# Patient Record
Sex: Male | Born: 1935 | Race: Asian | Hispanic: No | Marital: Married | Smoking: Former smoker
Health system: Southern US, Community
[De-identification: ages and names within clinical notes are randomized; demographics above are authoritative.]

## PROBLEM LIST (undated history)

## (undated) DIAGNOSIS — I639 Cerebral infarction, unspecified: Secondary | ICD-10-CM

## (undated) DIAGNOSIS — E7800 Pure hypercholesterolemia, unspecified: Secondary | ICD-10-CM

## (undated) DIAGNOSIS — I4891 Unspecified atrial fibrillation: Secondary | ICD-10-CM

## (undated) DIAGNOSIS — K219 Gastro-esophageal reflux disease without esophagitis: Secondary | ICD-10-CM

## (undated) DIAGNOSIS — I499 Cardiac arrhythmia, unspecified: Secondary | ICD-10-CM

## (undated) DIAGNOSIS — R42 Dizziness and giddiness: Secondary | ICD-10-CM

## (undated) DIAGNOSIS — M545 Low back pain, unspecified: Secondary | ICD-10-CM

## (undated) DIAGNOSIS — M1990 Unspecified osteoarthritis, unspecified site: Secondary | ICD-10-CM

## (undated) DIAGNOSIS — R0602 Shortness of breath: Secondary | ICD-10-CM

## (undated) DIAGNOSIS — I10 Essential (primary) hypertension: Secondary | ICD-10-CM

## (undated) DIAGNOSIS — R0600 Dyspnea, unspecified: Secondary | ICD-10-CM

## (undated) DIAGNOSIS — T8859XA Other complications of anesthesia, initial encounter: Secondary | ICD-10-CM

## (undated) DIAGNOSIS — G8929 Other chronic pain: Secondary | ICD-10-CM

## (undated) DIAGNOSIS — B1910 Unspecified viral hepatitis B without hepatic coma: Secondary | ICD-10-CM

## (undated) DIAGNOSIS — R0609 Other forms of dyspnea: Secondary | ICD-10-CM

## (undated) DIAGNOSIS — G459 Transient cerebral ischemic attack, unspecified: Secondary | ICD-10-CM

## (undated) DIAGNOSIS — T4145XA Adverse effect of unspecified anesthetic, initial encounter: Secondary | ICD-10-CM

## (undated) HISTORY — PX: EYE SURGERY: SHX253

## (undated) HISTORY — DX: Dyspnea, unspecified: R06.00

## (undated) HISTORY — DX: Other forms of dyspnea: R06.09

## (undated) SURGERY — MINOR CAPSULOTOMY
Anesthesia: Choice | Laterality: Right

---

## 1998-10-21 ENCOUNTER — Inpatient Hospital Stay (HOSPITAL_COMMUNITY): Admission: EM | Admit: 1998-10-21 | Discharge: 1998-10-22 | Payer: Self-pay | Admitting: Emergency Medicine

## 1998-10-21 ENCOUNTER — Encounter: Payer: Self-pay | Admitting: Emergency Medicine

## 1998-10-22 ENCOUNTER — Encounter: Payer: Self-pay | Admitting: Internal Medicine

## 1998-11-11 ENCOUNTER — Encounter: Admission: RE | Admit: 1998-11-11 | Discharge: 1998-11-11 | Payer: Self-pay | Admitting: Internal Medicine

## 1999-04-14 ENCOUNTER — Encounter: Admission: RE | Admit: 1999-04-14 | Discharge: 1999-04-14 | Payer: Self-pay | Admitting: Internal Medicine

## 1999-05-20 ENCOUNTER — Encounter: Admission: RE | Admit: 1999-05-20 | Discharge: 1999-05-20 | Payer: Self-pay | Admitting: Internal Medicine

## 1999-06-16 ENCOUNTER — Encounter: Admission: RE | Admit: 1999-06-16 | Discharge: 1999-06-16 | Payer: Self-pay | Admitting: Internal Medicine

## 1999-08-09 ENCOUNTER — Encounter: Admission: RE | Admit: 1999-08-09 | Discharge: 1999-08-09 | Payer: Self-pay | Admitting: Internal Medicine

## 1999-08-16 ENCOUNTER — Encounter: Admission: RE | Admit: 1999-08-16 | Discharge: 1999-08-16 | Payer: Self-pay | Admitting: Internal Medicine

## 1999-11-26 ENCOUNTER — Encounter: Admission: RE | Admit: 1999-11-26 | Discharge: 1999-11-26 | Payer: Self-pay | Admitting: Internal Medicine

## 1999-12-22 ENCOUNTER — Encounter: Admission: RE | Admit: 1999-12-22 | Discharge: 1999-12-22 | Payer: Self-pay | Admitting: Hematology and Oncology

## 2000-03-31 ENCOUNTER — Encounter: Admission: RE | Admit: 2000-03-31 | Discharge: 2000-03-31 | Payer: Self-pay | Admitting: Internal Medicine

## 2003-06-16 ENCOUNTER — Encounter: Admission: RE | Admit: 2003-06-16 | Discharge: 2003-06-16 | Payer: Self-pay | Admitting: Internal Medicine

## 2003-12-17 ENCOUNTER — Encounter: Admission: RE | Admit: 2003-12-17 | Discharge: 2003-12-17 | Payer: Self-pay | Admitting: Internal Medicine

## 2007-02-12 ENCOUNTER — Encounter: Admission: RE | Admit: 2007-02-12 | Discharge: 2007-02-12 | Payer: Self-pay | Admitting: Internal Medicine

## 2010-03-07 DIAGNOSIS — G459 Transient cerebral ischemic attack, unspecified: Secondary | ICD-10-CM

## 2010-03-07 HISTORY — DX: Transient cerebral ischemic attack, unspecified: G45.9

## 2010-08-03 ENCOUNTER — Emergency Department (HOSPITAL_COMMUNITY): Payer: Medicare Other

## 2010-08-03 ENCOUNTER — Emergency Department (HOSPITAL_COMMUNITY)
Admission: EM | Admit: 2010-08-03 | Discharge: 2010-08-04 | Disposition: A | Discharge status: Other Acute Inpatient Hospital | Payer: Medicare Other | Source: Home / Self Care | Attending: Emergency Medicine | Admitting: Emergency Medicine

## 2010-08-03 LAB — CBC
HCT: 41.7 % (ref 39.0–52.0)
Hemoglobin: 13.6 g/dL (ref 13.0–17.0)
MCHC: 32.6 g/dL (ref 30.0–36.0)
MCV: 84.8 fL (ref 78.0–100.0)
RDW: 13.2 % (ref 11.5–15.5)

## 2010-08-03 LAB — URINALYSIS, ROUTINE W REFLEX MICROSCOPIC
Bilirubin Urine: NEGATIVE
Glucose, UA: NEGATIVE mg/dL
Hgb urine dipstick: NEGATIVE
pH: 7.5 (ref 5.0–8.0)

## 2010-08-03 LAB — DIFFERENTIAL
Eosinophils Relative: 3 % (ref 0–5)
Lymphocytes Relative: 10 % — ABNORMAL LOW (ref 12–46)
Lymphs Abs: 1.3 10*3/uL (ref 0.7–4.0)
Monocytes Absolute: 0.7 10*3/uL (ref 0.1–1.0)

## 2010-08-04 ENCOUNTER — Emergency Department (HOSPITAL_COMMUNITY): Payer: Medicare Other

## 2010-08-04 ENCOUNTER — Encounter (HOSPITAL_COMMUNITY): Payer: Self-pay

## 2010-08-04 ENCOUNTER — Observation Stay (HOSPITAL_COMMUNITY)
Admission: EM | Admit: 2010-08-04 | Discharge: 2010-08-05 | Disposition: A | Discharge status: Home / Self Care | Payer: Medicare Other | Source: Other Acute Inpatient Hospital | Attending: Neurology | Admitting: Neurology

## 2010-08-04 ENCOUNTER — Inpatient Hospital Stay (HOSPITAL_COMMUNITY): Payer: Medicare Other

## 2010-08-04 DIAGNOSIS — E785 Hyperlipidemia, unspecified: Secondary | ICD-10-CM | POA: Insufficient documentation

## 2010-08-04 DIAGNOSIS — G458 Other transient cerebral ischemic attacks and related syndromes: Principal | ICD-10-CM | POA: Insufficient documentation

## 2010-08-04 DIAGNOSIS — I10 Essential (primary) hypertension: Secondary | ICD-10-CM | POA: Insufficient documentation

## 2010-08-04 DIAGNOSIS — Z8673 Personal history of transient ischemic attack (TIA), and cerebral infarction without residual deficits: Secondary | ICD-10-CM | POA: Insufficient documentation

## 2010-08-04 DIAGNOSIS — I059 Rheumatic mitral valve disease, unspecified: Secondary | ICD-10-CM

## 2010-08-04 LAB — COMPREHENSIVE METABOLIC PANEL
ALT: 21 U/L (ref 0–53)
AST: 28 U/L (ref 0–37)
Alkaline Phosphatase: 40 U/L (ref 39–117)
CO2: 26 mEq/L (ref 19–32)
GFR calc Af Amer: >60 mL/min (ref >60)
Glucose, Bld: 116 mg/dL — ABNORMAL HIGH (ref 70–99)
Potassium: 4.4 mEq/L (ref 3.5–5.1)
Sodium: 138 mEq/L (ref 135–145)
Total Protein: 7 g/dL (ref 6.0–8.3)

## 2010-08-04 LAB — CK TOTAL AND CKMB (NOT AT ARMC): Relative Index: 1.7 (ref 0.0–2.5)

## 2010-08-04 LAB — HEMOGLOBIN A1C: Mean Plasma Glucose: 114 mg/dL (ref <117)

## 2010-08-04 LAB — LIPID PANEL
Cholesterol: 153 mg/dL (ref 0–200)
HDL: 45 mg/dL (ref >39)
LDL Cholesterol: 91 mg/dL (ref 0–99)
Total CHOL/HDL Ratio: 3.4 RATIO
Triglycerides: 85 mg/dL (ref <150)

## 2010-08-04 LAB — LIPASE, BLOOD: Lipase: 57 U/L (ref 11–59)

## 2010-08-04 MED ORDER — GADOBENATE DIMEGLUMINE 529 MG/ML IV SOLN
15.0000 mL | Freq: Once | INTRAVENOUS | Status: AC
  Administered 2010-08-04: 15 mL via INTRAVENOUS

## 2010-08-09 NOTE — H&P (Signed)
Terry Gray               ACCOUNT NO.:  [id number]  MEDICAL RECORD NO.:  [id number]           PATIENT TYPE:  E  LOCATION:  WLED                         FACILITY:  [facility]  PHYSICIAN:  Marlan Palau, M.D.  DATE OF BIRTH:  [birth year]  DATE OF ADMISSION:  08/03/2010 DATE OF DISCHARGE:  08/04/2010                             HISTORY & PHYSICAL   HISTORY OF PRESENT ILLNESS:  Terry Gray is a 77 year old [place] male, born 01-20-1934, with a history of hypertension and dyslipidemia.  This patient comes to the [facility] Emergency Room for an evaluation.  Around 6:30 p.m. on the Aug 03, 2010, the patient had onset of nausea, vomiting, chest pain, diaphoresis and left-sided headache.  The patient felt as though left-sided body was somewhat heavy and had some confused speech.  Most of the deficits improved with the patient has continued to have some sensation of heaviness of the left arm and leg.  The patient has come to the emergency room for an evaluation.  The patient has undergone a CT scan of the brain that was unremarkable.  NIH stroke scale score is 2 and Neurology was asked to see the patient for further evaluation.  The patient has a history of prior stroke in the past.  PAST MEDICAL HISTORY:  Significant for: 1. New onset of left hemisensory deficit. 2. Cerebrovascular disease. 3. Hypertension. 4. Dyslipidemia. 5. Nonphysiologic examination.  MEDICATIONS: 1. Ramipril 10 mg daily. 2. Hydrochlorothiazide 25 mg daily. 3. Aspirin 81 mg daily. 4. Atorvastatin 20 mg daily.  ALLERGIES:  Has no known allergies.  SOCIAL HISTORY:  The patient does not smoke, drinks a glass of wine a day.  This patient is married, lives in the [place] area, has 8 children, few are alive and well.  The patient is retired.  FAMILY MEDICAL HISTORY:  Noted that mother died with cancer.  Father died with stroke.  The patient has 2 brothers, 2 sisters.  One sister died with  cancer.  Another sister has thyroid disease.  One brother has heart disease.  REVIEW OF SYSTEMS:  Notable for no recent fevers, chills.  The patient does note left-sided headache.  Does note some chest pain.  Denies shortness of breath.  Does note some dizziness, frequent urination today.  Denies blackouts.  PHYSICAL EXAMINATION:  VITAL SIGNS:  Blood pressure is notable for 126/77, heart rate 64, respiratory rate 20, temperature afebrile. GENERAL:  This patient is a minimally obese [place] male who is alert, cooperative at the time of examination. HEENT:  Head is atraumatic.  Eyes:  Pupils are equal, round, and reactive to light. NECK:  Supple.  No carotid bruits noted. RESPIRATORY:  Clear. CARDIOVASCULAR:  Regular rate and rhythm.  No obvious murmurs, rubs noted. EXTREMITIES:  Without significant edema. NEUROLOGIC:  Cranial nerves as above.  Facial symmetry is present.  The patient has good sensation of the face on the right, decreased on the left, splits midline with a vibratory sensation on the forehead.  Again, extraocular movements are full.  Visual fields are full.  Speech is normal.  No aphasia noted.  Motor testing  reveals good strength in all fours.  Good symmetric motor tone is noted throughout.  Sensory test reveals some decreased pinprick, soft touch, vibratory sensation on the left arm, left leg as compared to the right.  The patient has good finger-nose-finger, heel-to-shin.  Gait was not tested.  Deep tendon reflexes are symmetric.  Toes are downgoing bilaterally.  No drift is seen on the arms or legs.  The patient has no evidence of an aphasia.  LABORATORY VALUES:  Notable for a white count of 12.2, hemoglobin 13.6, hematocrit 41.7, MCV 84.8, platelets of 140.  Sodium 138, potassium 4.4, chloride of 101, CO2 26, glucose of 116, BUN of 14, creatinine 0.78, calcium 8.9, total protein 7.0, albumin 3.5, AST 28.  CK is 212, MB fraction 3.5, troponin I less than 0.3,  lipase 57.  Urinalysis reveals specific gravity of 1.015, pH 7.5, otherwise unremarkable.  CT of the head is unremarkable.  IMPRESSION: 1. Left hemisensory deficit.  NIH stroke scale score of 2. 2. Nonphysiologic examination. 3. Hypertension. 4. Dyslipidemia.  This patient apparently has a prior history of cerebrovascular disease. The patient comes in with subjective complaints of heaviness on the left side of the body, left hemisensory deficit, but splits midline with vibratory sensation on the forehead suggesting a nonphysiologic examination.  The patient will be admitted for evaluation of cerebrovascular disease at this point.  We will admit the patient to [facility] and we will obtain MRI of the brain and MRA of the head and neck.  The patient will undergo a 2-D echocardiogram and continued to be maintained on aspirin therapy.  We will follow the patient's clinical course while in-house.     Marlan Palau, M.D.     CKW/MEDQ  D:  08/04/2010  T:  08/04/2010  Job:  [id number]  cc:   Georgann Housekeeper, MD  Electronically Signed by Thana Farr M.D. on 08/09/2010 09:31:01 AM

## 2010-08-22 NOTE — Discharge Summary (Signed)
Gray, Terry               ACCOUNT NO.:  [id number]  MEDICAL RECORD NO.:  [id number]           PATIENT TYPE:  O  LOCATION:  3701                         FACILITY:  [facility]  PHYSICIAN:  Sheala Dosh P. Pearlean Brownie, MD    DATE OF BIRTH:  June 08, 1933  DATE OF ADMISSION:  08/04/2010 DATE OF DISCHARGE:  08/05/2010                              DISCHARGE SUMMARY   DIAGNOSES AT TIME OF DISCHARGE: 1. Vertebrobasilar transient ischemic attack. 2. Hypertension. 3. Hyperlipidemia. 4. History of stroke in the past, cerebrovascular disease.  MEDICINES AT TIME OF DISCHARGE: 1. Clopidogrel 75 mg a day. 2. Atorvastatin 20 mg 1/2 tablet every evening. 3. Hydrochlorothiazide 25 mg a day. 4. Ramipril 10 mg a day.  STUDIES PERFORMED: 1. CT of the brain on admission shows no acute abnormality. 2. Chest x-ray shows no acute cardiopulmonary processes seen, mild     calcification of the abdominal aorta. 3. MRI of the brain shows no acute stroke.  Old lacunar infarct, left     thalamus.  Chronic small vessel disease affecting the hemispheric     white matter. 4. MRA of the head normal and medium and large-sized vessels. 5. MRA of the neck shows some image degradation because of motion and     bolus timing.  No posterior circulation pathology suspected.     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 6. Carotid Doppler not performed. 7. Two-D echocardiogram shows EF of 55-60% with no obvious source of     embolus. 8. EKG, formal reading pending, unconfirmed normal sinus rhythm.  LABORATORY STUDIES:  Hemoglobin A1c 5.6.  Cholesterol 153, triglycerides 85, HDL 45, and LDL 91.  Coagulation studies normal.  Lipase 57. Cardiac enzymes negative.  Chemistry with glucose 116, otherwise normal. Liver function tests normal.  CBC with white blood cells 12.2, platelets 114, neutrophils 81, lymphocytes 10, otherwise normal.  Urinalysis normal.  HISTORY OF PRESENT ILLNESS:  Terry Gray is a  77 year old [place] male with a history of hypertension and dyslipidemia.  The patient presented to [facility] Emergency Room for evaluation.  Around 6:30 p.m. on Aug 03, 2010, the patient had onset of nausea, vomiting, chest pain, diaphoresis, and left-sided headache.  The patient felt that though the left side of the body was somewhat heavy and he had confused speech.  Most of the deficits improved with the patient continuing to have some sensation of heaviness in the left arm and leg.  He presented to the emergency room for evaluation.  CT of the brain was unremarkable. NIH stroke scale was 2.  The patient was admitted for further evaluation.  The patient was admitted for further evaluation.  He was not a tPA candidate secondary to delay in arrival.  HOSPITAL COURSE:  MRI was negative for acute stroke.  It is felt his symptoms are consistent with vertebrobasilar TIA.  He was changed from aspirin to Plavix for secondary stroke prevention.  He does have vascular risk factors of hypertension and hyperlipidemia which seem well controlled.  No new risk factors were identified.  The patient has returned to his baseline and has no outpatient  therapy followup needs. Dr. Pearlean Brownie has asked that he follows up in 2 months and consider the IRIS study which is use of Amaryl and stroke prevention.  CONDITION AT TIME OF DISCHARGE:  The patient is alert and oriented to person, place, and situation.  He has normal speech and normal language. He has no focal neurologic deficits.  DISCHARGE PLAN: 1. Discharge home with family. 2. Plavix for secondary stroke prevention. 3. No therapy needs. 4. Consider IRIS study. 5. Follow up with Dr. Pearlean Brownie in 1-2 months. 6. Follow up with primary care physician within 1 month.     Annie Main, N.P.   ______________________________ Sunny Schlein. Pearlean Brownie, MD    SB/MEDQ  D:  08/05/2010  T:  08/06/2010  Job:  [id number]  cc:   Georgann Housekeeper, MD  Electronically  Signed by Annie Main N.P. on 08/09/2010 10:15:11 AM Electronically Signed by Delia Heady MD on 08/22/2010 08:49:35 PM

## 2010-10-20 ENCOUNTER — Other Ambulatory Visit: Payer: Self-pay | Admitting: Neurology

## 2010-10-20 DIAGNOSIS — M5430 Sciatica, unspecified side: Secondary | ICD-10-CM

## 2010-10-29 ENCOUNTER — Ambulatory Visit
Admission: RE | Admit: 2010-10-29 | Discharge: 2010-10-29 | Disposition: A | Discharge status: Home / Self Care | Payer: Medicare Other | Source: Ambulatory Visit | Attending: Neurology | Admitting: Neurology

## 2010-10-29 DIAGNOSIS — M5430 Sciatica, unspecified side: Secondary | ICD-10-CM

## 2012-01-09 ENCOUNTER — Other Ambulatory Visit: Payer: Self-pay | Admitting: Ophthalmology

## 2012-01-09 NOTE — H&P (Signed)
  Pre-operative History and Physical for Ophthalmic Surgery  Fritz Moree 01/09/2012                  Chief Complaint: Decreased viison  Diagnosis:  Combined Cataract  No Known Allergy  Prior to Admission medications   Not on File    Planned Procedure:                                       Phacoemulsification, Posterior Chamber Intra-ocular Lens Right eye                                       Acrysof MA50BM + 12.50 Diopter PC IOL for implant OD  There were no vitals filed for this visit.  Pulse: 70         Temp: NE        Resp:  16       ROS: non-contributory  No past medical history on file.  No past surgical history on file.   History   Social History  . Marital Status: Married    Spouse Name: N/A    Number of Children: N/A  . Years of Education: N/A   Occupational History  . Not on file.   Social History Main Topics  . Smoking status: Not on file  . Smokeless tobacco: Not on file  . Alcohol Use: Not on file  . Drug Use: Not on file  . Sexually Active: Not on file   Other Topics Concern  . Not on file   Social History Narrative  . No narrative on file     The following examination is for anesthesia clearance for minimally invasive Ophthalmic surgery. It is primarily to document heart and lung findings and is not intended to elucidate unknown general medical conditions inclusive of abdominal masses, lung lesions, etc.   General Constitution:  within normal limits   Alertness/Orientation:  Person, time place     yes   HEENT:  Eye Findings:  Combined Cataract                   right eye  Neck: supple without masses  Chest/Lungs: clear to auscultation  Cardiac: Normal S1 and S2 without Murmur, S3 or S4  Neuro: non-focal  Impression:  Combined Cataract Right Eye  Planned Procedure:  Phacoemulsification, Posterior Chamber Intraocular Lens Right Eye    Seairra Otani, MD        

## 2012-01-18 ENCOUNTER — Encounter (HOSPITAL_COMMUNITY): Payer: Self-pay | Admitting: Pharmacy Technician

## 2012-01-23 ENCOUNTER — Encounter (HOSPITAL_COMMUNITY): Payer: Self-pay

## 2012-01-23 ENCOUNTER — Ambulatory Visit (HOSPITAL_COMMUNITY)
Admission: RE | Admit: 2012-01-23 | Discharge: 2012-01-23 | Disposition: A | Discharge status: Home / Self Care | Payer: Medicare Other | Source: Ambulatory Visit | Attending: Anesthesiology | Admitting: Anesthesiology

## 2012-01-23 ENCOUNTER — Encounter (HOSPITAL_COMMUNITY)
Admission: RE | Admit: 2012-01-23 | Discharge: 2012-01-23 | Disposition: A | Discharge status: Home / Self Care | Payer: Medicare Other | Source: Ambulatory Visit | Attending: Ophthalmology | Admitting: Ophthalmology

## 2012-01-23 DIAGNOSIS — I10 Essential (primary) hypertension: Secondary | ICD-10-CM | POA: Insufficient documentation

## 2012-01-23 DIAGNOSIS — Z01818 Encounter for other preprocedural examination: Secondary | ICD-10-CM | POA: Insufficient documentation

## 2012-01-23 DIAGNOSIS — Z01812 Encounter for preprocedural laboratory examination: Secondary | ICD-10-CM | POA: Insufficient documentation

## 2012-01-23 HISTORY — DX: Cerebral infarction, unspecified: I63.9

## 2012-01-23 HISTORY — DX: Shortness of breath: R06.02

## 2012-01-23 HISTORY — DX: Essential (primary) hypertension: I10

## 2012-01-23 LAB — CBC
MCHC: 32.4 g/dL (ref 30.0–36.0)
Platelets: 135 10*3/uL — ABNORMAL LOW (ref 150–400)
RDW: 13.5 % (ref 11.5–15.5)
WBC: 7.5 10*3/uL (ref 4.0–10.5)

## 2012-01-23 LAB — BASIC METABOLIC PANEL
BUN: 16 mg/dL (ref 6–23)
Calcium: 9 mg/dL (ref 8.4–10.5)
Creatinine, Ser: 0.99 mg/dL (ref 0.50–1.35)
GFR calc Af Amer: 90 mL/min — ABNORMAL LOW (ref >90)

## 2012-01-23 NOTE — Pre-Procedure Instructions (Signed)
22 Terry Gray  01/23/2012   Your procedure is scheduled on:  February 01 2012  Wednesday  Report to [facility] at 0630 AM.  Call this number if you have problems the morning of surgery: [phone]   Remember:   Do not eat food: Do not drink liquids. After Midnight.    Take these medicines the morning of surgery with A SIP OF WATER: none   Do not wear jewelry, make-up or nail polish.  Do not wear lotions, powders, or perfumes. You may wear deodorant.  Do not shave 48 hours prior to surgery. Men may shave face and neck.  Do not bring valuables to the hospital.  Contacts, dentures or bridgework may not be worn into surgery.  Leave suitcase in the car. After surgery it may be brought to your room.  For patients admitted to the hospital, checkout time is 11:00 AM the day of discharge.   Patients discharged the day of surgery will not be allowed to drive home.  Name and phone number of your driver: daughter [name]   Special Instructions: Shower using CHG 2 nights before surgery and the night before surgery.  If you shower the day of surgery use CHG.  Use special wash - you have one bottle of CHG for all showers.  You should use approximately 1/3 of the bottle for each shower.   Please read over the following fact sheets that you were given: Pain Booklet, Coughing and Deep Breathing, Lab Information and Surgical Site Infection Prevention

## 2012-01-23 NOTE — Pre-Procedure Instructions (Signed)
20 Terry Gray  01/23/2012   Your procedure is scheduled on:  02/01/2012  Wednesday  Report to [facility] at 0630 AM.  Call this number if you have problems the morning of surgery: [phone]   Remember:   Do not eat food: or drink liquids. After Midnight.    Take these medicines the morning of surgery with A SIP OF WATER: none   Do not wear jewelry, make-up or nail polish.  Do not wear lotions, powders, or perfumes. You may wear deodorant.  Do not shave 48 hours prior to surgery. Men may shave face and neck.  Do not bring valuables to the hospital.  Contacts, dentures or bridgework may not be worn into surgery.  Leave suitcase in the car. After surgery it may be brought to your room.  For patients admitted to the hospital, checkout time is 11:00 AM the day of discharge.   Patients discharged the day of surgery will not be allowed to drive home.  Name and phone number of your driver: [name]- daughter- [phone]  Special Instructions: Shower using CHG 2 nights before surgery and the night before surgery.  If you shower the day of surgery use CHG.  Use special wash - you have one bottle of CHG for all showers.  You should use approximately 1/3 of the bottle for each shower.   Please read over the following fact sheets that you were given: Pain Booklet, Coughing and Deep Breathing, Lab Information and Surgical Site Infection Prevention

## 2012-01-23 NOTE — Progress Notes (Addendum)
Called Dr Paulita Fujita office for  ekg and cxr ... None available... Within one year... Asked for last ov ... They are to fax to me... Called Revonda Standard to inquire regarding  Pt direction  About plavix and when to stop ..she suggested calling Dr Clarisa Kindred.  . Unable to reach Dr Clarisa Kindred about plavix.  Pt was not advised to stop but will not take this the day of surgery ... Left message for Dr Clarisa Kindred.Marland Kitchen

## 2012-02-01 ENCOUNTER — Ambulatory Visit (HOSPITAL_COMMUNITY)
Admission: RE | Admit: 2012-02-01 | Discharge: 2012-02-01 | Disposition: A | Discharge status: Home / Self Care | Payer: Medicare Other | Source: Ambulatory Visit | Attending: Ophthalmology | Admitting: Ophthalmology

## 2012-02-01 ENCOUNTER — Encounter (HOSPITAL_COMMUNITY): Payer: Self-pay | Admitting: Anesthesiology

## 2012-02-01 ENCOUNTER — Encounter (HOSPITAL_COMMUNITY)
Admission: RE | Disposition: A | Discharge status: Home / Self Care | Payer: Self-pay | Source: Ambulatory Visit | Attending: Ophthalmology

## 2012-02-01 ENCOUNTER — Ambulatory Visit (HOSPITAL_COMMUNITY): Payer: Medicare Other | Admitting: Anesthesiology

## 2012-02-01 ENCOUNTER — Encounter (HOSPITAL_COMMUNITY): Payer: Self-pay | Admitting: *Deleted

## 2012-02-01 DIAGNOSIS — H4300 Vitreous prolapse, unspecified eye: Secondary | ICD-10-CM | POA: Insufficient documentation

## 2012-02-01 DIAGNOSIS — K219 Gastro-esophageal reflux disease without esophagitis: Secondary | ICD-10-CM | POA: Insufficient documentation

## 2012-02-01 DIAGNOSIS — I10 Essential (primary) hypertension: Secondary | ICD-10-CM | POA: Insufficient documentation

## 2012-02-01 DIAGNOSIS — H2510 Age-related nuclear cataract, unspecified eye: Secondary | ICD-10-CM | POA: Insufficient documentation

## 2012-02-01 DIAGNOSIS — Z8673 Personal history of transient ischemic attack (TIA), and cerebral infarction without residual deficits: Secondary | ICD-10-CM | POA: Insufficient documentation

## 2012-02-01 HISTORY — PX: CATARACT EXTRACTION W/PHACO: SHX586

## 2012-02-01 SURGERY — PHACOEMULSIFICATION, CATARACT, WITH IOL INSERTION
Anesthesia: Monitor Anesthesia Care | Site: Eye | Laterality: Right | Wound class: Clean

## 2012-02-01 MED ORDER — HYPROMELLOSE (GONIOSCOPIC) 2.5 % OP SOLN
OPHTHALMIC | Status: AC
  Filled 2012-02-01: qty 15

## 2012-02-01 MED ORDER — FENTANYL CITRATE 0.05 MG/ML IJ SOLN
INTRAMUSCULAR | Status: DC | PRN
  Administered 2012-02-01: 25 ug via INTRAVENOUS

## 2012-02-01 MED ORDER — SODIUM HYALURONATE 10 MG/ML IO SOLN
INTRAOCULAR | Status: AC
  Filled 2012-02-01: qty 0.85

## 2012-02-01 MED ORDER — LACTATED RINGERS IV SOLN
INTRAVENOUS | Status: DC | PRN
  Administered 2012-02-01: 08:00:00 via INTRAVENOUS

## 2012-02-01 MED ORDER — DEXAMETHASONE SODIUM PHOSPHATE 10 MG/ML IJ SOLN
INTRAMUSCULAR | Status: DC | PRN
  Administered 2012-02-01: 10 mg via INTRAVENOUS

## 2012-02-01 MED ORDER — MIDAZOLAM HCL 5 MG/5ML IJ SOLN
INTRAMUSCULAR | Status: DC | PRN
  Administered 2012-02-01: 1 mg via INTRAVENOUS

## 2012-02-01 MED ORDER — BACITRACIN-POLYMYXIN B 500-10000 UNIT/GM OP OINT
TOPICAL_OINTMENT | OPHTHALMIC | Status: DC | PRN
  Administered 2012-02-01: 1 via OPHTHALMIC

## 2012-02-01 MED ORDER — PROPOFOL 10 MG/ML IV BOLUS
INTRAVENOUS | Status: DC | PRN
  Administered 2012-02-01: 30 mg via INTRAVENOUS

## 2012-02-01 MED ORDER — TETRACAINE HCL 0.5 % OP SOLN
1.0000 [drp] | OPHTHALMIC | Status: AC
  Administered 2012-02-01: 2 [drp] via OPHTHALMIC

## 2012-02-01 MED ORDER — PHENYLEPHRINE HCL 2.5 % OP SOLN
OPHTHALMIC | Status: AC
  Administered 2012-02-01: 1 [drp] via OPHTHALMIC
  Filled 2012-02-01: qty 3

## 2012-02-01 MED ORDER — BSS IO SOLN
INTRAOCULAR | Status: AC
  Filled 2012-02-01: qty 500

## 2012-02-01 MED ORDER — WATER FOR IRRIGATION, STERILE IR SOLN
Status: DC | PRN
  Administered 2012-02-01: 1000 mL via SURGICAL_CAVITY

## 2012-02-01 MED ORDER — NA CHONDROIT SULF-NA HYALURON 40-30 MG/ML IO SOLN
INTRAOCULAR | Status: DC | PRN
  Administered 2012-02-01: 0.5 mL via INTRAOCULAR

## 2012-02-01 MED ORDER — NA CHONDROIT SULF-NA HYALURON 40-30 MG/ML IO SOLN
INTRAOCULAR | Status: AC
  Filled 2012-02-01: qty 0.5

## 2012-02-01 MED ORDER — PREDNISOLONE ACETATE 1 % OP SUSP
1.0000 [drp] | Freq: Once | OPHTHALMIC | Status: DC
  Filled 2012-02-01: qty 1

## 2012-02-01 MED ORDER — HYPROMELLOSE (GONIOSCOPIC) 2.5 % OP SOLN
OPHTHALMIC | Status: DC | PRN
  Administered 2012-02-01: 2 [drp] via OPHTHALMIC

## 2012-02-01 MED ORDER — ACETYLCHOLINE CHLORIDE 1:100 IO SOLR
INTRAOCULAR | Status: DC | PRN
  Administered 2012-02-01: 20 mg via INTRAOCULAR

## 2012-02-01 MED ORDER — GATIFLOXACIN 0.5 % OP SOLN
1.0000 [drp] | OPHTHALMIC | Status: AC | PRN
  Administered 2012-02-01 (×3): 1 [drp] via OPHTHALMIC

## 2012-02-01 MED ORDER — ONDANSETRON HCL 4 MG/2ML IJ SOLN
INTRAMUSCULAR | Status: DC | PRN
  Administered 2012-02-01: 4 mg via INTRAVENOUS

## 2012-02-01 MED ORDER — PREDNISOLONE ACETATE 1 % OP SUSP
1.0000 [drp] | OPHTHALMIC | Status: DC
  Administered 2012-02-01: 1 [drp] via OPHTHALMIC

## 2012-02-01 MED ORDER — EPINEPHRINE HCL 1 MG/ML IJ SOLN
INTRAOCULAR | Status: DC | PRN
  Administered 2012-02-01: 09:00:00

## 2012-02-01 MED ORDER — DEXAMETHASONE SODIUM PHOSPHATE 10 MG/ML IJ SOLN
INTRAMUSCULAR | Status: AC
  Filled 2012-02-01: qty 1

## 2012-02-01 MED ORDER — CEFAZOLIN SUBCONJUNCTIVAL INJECTION 100 MG/0.5 ML
200.0000 mg | INJECTION | Freq: Once | SUBCONJUNCTIVAL | Status: DC
  Filled 2012-02-01: qty 1

## 2012-02-01 MED ORDER — GATIFLOXACIN 0.5 % OP SOLN
OPHTHALMIC | Status: AC
  Administered 2012-02-01: 1 [drp] via OPHTHALMIC
  Filled 2012-02-01: qty 2.5

## 2012-02-01 MED ORDER — CEFAZOLIN SUBCONJUNCTIVAL INJECTION 100 MG/0.5 ML
100.0000 mg | INJECTION | Freq: Once | SUBCONJUNCTIVAL | Status: DC
  Filled 2012-02-01: qty 0.5

## 2012-02-01 MED ORDER — TETRACAINE HCL 0.5 % OP SOLN
OPHTHALMIC | Status: AC
  Filled 2012-02-01: qty 2

## 2012-02-01 MED ORDER — BUPIVACAINE HCL (PF) 0.75 % IJ SOLN
INTRAMUSCULAR | Status: AC
  Filled 2012-02-01: qty 10

## 2012-02-01 MED ORDER — PHENYLEPHRINE HCL 2.5 % OP SOLN
1.0000 [drp] | OPHTHALMIC | Status: AC | PRN
  Administered 2012-02-01 (×3): 1 [drp] via OPHTHALMIC

## 2012-02-01 MED ORDER — CEFAZOLIN SUBCONJUNCTIVAL INJECTION 100 MG/0.5 ML
200.0000 mg | INJECTION | Freq: Once | SUBCONJUNCTIVAL | Status: AC
  Administered 2012-02-01: 200 mg via SUBCONJUNCTIVAL
  Filled 2012-02-01: qty 1

## 2012-02-01 MED ORDER — PREDNISOLONE ACETATE 1 % OP SUSP
OPHTHALMIC | Status: AC
  Administered 2012-02-01: 1 [drp] via OPHTHALMIC
  Filled 2012-02-01: qty 5

## 2012-02-01 MED ORDER — LIDOCAINE HCL 2 % IJ SOLN
INTRAMUSCULAR | Status: AC
  Filled 2012-02-01: qty 20

## 2012-02-01 MED ORDER — BUPIVACAINE HCL (PF) 0.75 % IJ SOLN
INTRAMUSCULAR | Status: DC | PRN
  Administered 2012-02-01: 10 mL

## 2012-02-01 MED ORDER — ACETYLCHOLINE CHLORIDE 1:100 IO SOLR
INTRAOCULAR | Status: AC
  Filled 2012-02-01: qty 1

## 2012-02-01 MED ORDER — EPINEPHRINE HCL 1 MG/ML IJ SOLN
INTRAMUSCULAR | Status: AC
  Filled 2012-02-01: qty 1

## 2012-02-01 MED ORDER — TRIAMCINOLONE ACETONIDE 40 MG/ML IJ SUSP
INTRAMUSCULAR | Status: AC
  Filled 2012-02-01: qty 1

## 2012-02-01 MED ORDER — BACITRACIN-POLYMYXIN B 500-10000 UNIT/GM OP OINT
TOPICAL_OINTMENT | OPHTHALMIC | Status: AC
  Filled 2012-02-01: qty 3.5

## 2012-02-01 MED ORDER — ACETAZOLAMIDE SODIUM 500 MG IJ SOLR
INTRAMUSCULAR | Status: AC
  Filled 2012-02-01: qty 500

## 2012-02-01 SURGICAL SUPPLY — 65 items
APL SRG 3 HI ABS STRL LF PLS (MISCELLANEOUS) ×1
APPLICATOR COTTON TIP 6IN STRL (MISCELLANEOUS) ×2 IMPLANT
APPLICATOR DR MATTHEWS STRL (MISCELLANEOUS) ×2 IMPLANT
BAG FLD CLT MN 6.25X3.5 (WOUND CARE) ×1
BAG MINI COLL DRAIN (WOUND CARE) ×2 IMPLANT
BLADE EYE MINI 60D BEAVER (BLADE) IMPLANT
BLADE KERATOME 2.75 (BLADE) ×2 IMPLANT
BLADE STAB KNIFE 15DEG (BLADE) IMPLANT
CANNULA ANTERIOR CHAMBER 27GA (MISCELLANEOUS) IMPLANT
CLOTH BEACON ORANGE TIMEOUT ST (SAFETY) ×2 IMPLANT
DRAPE OPHTHALMIC 77X100 STRL (CUSTOM PROCEDURE TRAY) ×2 IMPLANT
DRAPE POUCH INSTRU U-SHP 10X18 (DRAPES) ×2 IMPLANT
DRSG TEGADERM 4X4.75 (GAUZE/BANDAGES/DRESSINGS) ×2 IMPLANT
FILTER BLUE MILLIPORE (MISCELLANEOUS) IMPLANT
GLOVE SS BIOGEL STRL SZ 6.5 (GLOVE) ×1 IMPLANT
GLOVE SS N UNI LF 6.5 STRL (GLOVE) ×1 IMPLANT
GLOVE SS N UNI LF 8.0 STRL (GLOVE) ×1 IMPLANT
GLOVE SUPERSENSE BIOGEL SZ 6.5 (GLOVE) ×1
GOWN SRG XL XLNG 56XLVL 4 (GOWN DISPOSABLE) ×1 IMPLANT
GOWN STRL NON-REIN LRG LVL3 (GOWN DISPOSABLE) ×2 IMPLANT
GOWN STRL NON-REIN XL XLG LVL4 (GOWN DISPOSABLE) ×2
KIT BASIN OR (CUSTOM PROCEDURE TRAY) ×2 IMPLANT
KIT ROOM TURNOVER OR (KITS) IMPLANT
KNIFE GRIESHABER SHARP 2.5MM (MISCELLANEOUS) ×2 IMPLANT
LENS IOL ACRYSOF MP POST 18.0 (Intraocular Lens) ×1 IMPLANT
MASK EYE SHIELD (GAUZE/BANDAGES/DRESSINGS) ×1 IMPLANT
NDL 18GX1X1/2 (RX/OR ONLY) (NEEDLE) IMPLANT
NDL 25GX 5/8IN NON SAFETY (NEEDLE) ×1 IMPLANT
NDL FILTER BLUNT 18X1 1/2 (NEEDLE) IMPLANT
NDL HYPO 30X.5 LL (NEEDLE) ×2 IMPLANT
NEEDLE 18GX1X1/2 (RX/OR ONLY) (NEEDLE) ×2 IMPLANT
NEEDLE 22X1 1/2 (OR ONLY) (NEEDLE) ×2 IMPLANT
NEEDLE 25GX 5/8IN NON SAFETY (NEEDLE) ×2 IMPLANT
NEEDLE FILTER BLUNT 18X 1/2SAF (NEEDLE)
NEEDLE FILTER BLUNT 18X1 1/2 (NEEDLE) IMPLANT
NEEDLE HYPO 30X.5 LL (NEEDLE) ×4 IMPLANT
NS IRRIG 1000ML POUR BTL (IV SOLUTION) ×2 IMPLANT
PACK CATARACT CUSTOM (CUSTOM PROCEDURE TRAY) ×2 IMPLANT
PACK CATARACT MCHSCP (PACKS) ×2 IMPLANT
PACK COMBINED CATERACT/VIT 23G (OPHTHALMIC RELATED) IMPLANT
PAD ARMBOARD 7.5X6 YLW CONV (MISCELLANEOUS) ×4 IMPLANT
PAD EYE OVAL STERILE LF (GAUZE/BANDAGES/DRESSINGS) ×1 IMPLANT
PHACO TIP KELMAN 45DEG (TIP) ×2 IMPLANT
PROBE ANTERIOR 20G W/INFUS NDL (MISCELLANEOUS) ×1 IMPLANT
RING MALYGIN (MISCELLANEOUS) IMPLANT
ROLLS DENTAL (MISCELLANEOUS) IMPLANT
SHUTTLE MONARCH TYPE A (NEEDLE) ×2 IMPLANT
SOLUTION ANTI FOG 6CC (MISCELLANEOUS) ×2 IMPLANT
SPEAR EYE SURG WECK-CEL (MISCELLANEOUS) ×2 IMPLANT
SUT ETHILON 10-0 CS-B-6CS-B-6 (SUTURE)
SUT ETHILON 5 0 P 3 18 (SUTURE)
SUT ETHILON 9 0 TG140 8 (SUTURE) IMPLANT
SUT NYLON ETHILON 5-0 P-3 1X18 (SUTURE) IMPLANT
SUT PLAIN 6 0 TG1408 (SUTURE) IMPLANT
SUT POLY NON ABSORB 10-0 8 STR (SUTURE) IMPLANT
SUT VICRYL 6 0 S 29 12 (SUTURE) IMPLANT
SUTURE EHLN 10-0 CS-B-6CS-B-6 (SUTURE) IMPLANT
SYR 20CC LL (SYRINGE) IMPLANT
SYR 5ML LL (SYRINGE) IMPLANT
SYR TB 1ML LUER SLIP (SYRINGE) IMPLANT
SYRINGE 10CC LL (SYRINGE) IMPLANT
TAPE PAPER MEDFIX 1IN X 10YD (GAUZE/BANDAGES/DRESSINGS) ×1 IMPLANT
TOWEL OR 17X24 6PK STRL BLUE (TOWEL DISPOSABLE) ×4 IMPLANT
WATER STERILE IRR 1000ML POUR (IV SOLUTION) ×2 IMPLANT
WIPE INSTRUMENT VISIWIPE 73X73 (MISCELLANEOUS) ×2 IMPLANT

## 2012-02-01 NOTE — Interval H&P Note (Signed)
History and Physical Interval Note:  02/01/2012 8:40 AM  Terry Gray  has presented today for surgery, with the diagnosis of Combined Cataract Right Eye  The various methods of treatment have been discussed with the patient and family. After consideration of risks, benefits and other options for treatment, the patient has consented to  Procedure(s) (LRB) with comments: CATARACT EXTRACTION PHACO AND INTRAOCULAR LENS PLACEMENT (IOC) (Right) as a surgical intervention .  The patient's history has been reviewed, patient examined, no change in status, stable for surgery.  I have reviewed the patient's chart and labs.  Questions were answered to the patient's satisfaction.     Shade Flood, MD

## 2012-02-01 NOTE — Anesthesia Postprocedure Evaluation (Signed)
  Anesthesia Post-op Note  Patient: Terry Gray  Procedure(s) Performed: Procedure(s) (LRB) with comments: CATARACT EXTRACTION PHACO AND INTRAOCULAR LENS PLACEMENT (IOC) (Right)  Patient Location: Short Stay  Anesthesia Type:MAC  Level of Consciousness: awake, alert , oriented and patient cooperative  Airway and Oxygen Therapy: Patient Spontanous Breathing  Post-op Pain: none  Post-op Assessment: Post-op Vital signs reviewed, Patient's Cardiovascular Status Stable, Respiratory Function Stable, Patent Airway and No signs of Nausea or vomiting  Post-op Vital Signs: Reviewed and stable  Complications: No apparent anesthesia complications

## 2012-02-01 NOTE — Transfer of Care (Signed)
Immediate Anesthesia Transfer of Care Note  Patient: Terry Gray  Procedure(s) Performed: Procedure(s) (LRB) with comments: CATARACT EXTRACTION PHACO AND INTRAOCULAR LENS PLACEMENT (IOC) (Right)  Patient Location: PACU  Anesthesia Type:MAC  Level of Consciousness: awake, alert  and oriented  Airway & Oxygen Therapy: Patient Spontanous Breathing  Post-op Assessment: Report given to PACU RN and Post -op Vital signs reviewed and stable  Post vital signs: Reviewed and stable  Complications: No apparent anesthesia complications

## 2012-02-01 NOTE — H&P (View-Only) (Signed)
  Pre-operative History and Physical for Ophthalmic Surgery  Terry Gray 01/09/2012                  Chief Complaint: Decreased viison  Diagnosis:  Combined Cataract  No Known Allergy  Prior to Admission medications   Not on File    Planned Procedure:                                       Phacoemulsification, Posterior Chamber Intra-ocular Lens Right eye                                       Acrysof MA50BM + 12.50 Diopter PC IOL for implant OD  There were no vitals filed for this visit.  Pulse: 70         Temp: NE        Resp:  16       ROS: non-contributory  No past medical history on file.  No past surgical history on file.   History   Social History  . Marital Status: Married    Spouse Name: N/A    Number of Children: N/A  . Years of Education: N/A   Occupational History  . Not on file.   Social History Main Topics  . Smoking status: Not on file  . Smokeless tobacco: Not on file  . Alcohol Use: Not on file  . Drug Use: Not on file  . Sexually Active: Not on file   Other Topics Concern  . Not on file   Social History Narrative  . No narrative on file     The following examination is for anesthesia clearance for minimally invasive Ophthalmic surgery. It is primarily to document heart and lung findings and is not intended to elucidate unknown general medical conditions inclusive of abdominal masses, lung lesions, etc.   General Constitution:  within normal limits   Alertness/Orientation:  Person, time place     yes   HEENT:  Eye Findings:  Combined Cataract                   right eye  Neck: supple without masses  Chest/Lungs: clear to auscultation  Cardiac: Normal S1 and S2 without Murmur, S3 or S4  Neuro: non-focal  Impression:  Combined Cataract Right Eye  Planned Procedure:  Phacoemulsification, Posterior Chamber Intraocular Lens Right Clement Husbands, MD

## 2012-02-01 NOTE — Addendum Note (Signed)
Addendum  created 02/01/12 1118 by Violet Seabury K Jonesha Tsuchiya, CRNA   Modules edited:Anesthesia Medication Administration    

## 2012-02-01 NOTE — Anesthesia Preprocedure Evaluation (Addendum)
Anesthesia Evaluation  Patient identified by MRN, date of birth, ID band Patient awake    Reviewed: Allergy & Precautions, H&P , NPO status , Patient's Chart, lab work & pertinent test results  History of Anesthesia Complications (+) AWARENESS UNDER ANESTHESIANegative for: history of anesthetic complications  Airway Mallampati: II TM Distance: >3 FB Neck ROM: Full    Dental  (+) Caps, Dental Advisory Given and Teeth Intact   Pulmonary neg pulmonary ROS, shortness of breath and with exertion, former smoker,  breath sounds clear to auscultation  Pulmonary exam normal       Cardiovascular hypertension, Pt. on medications Rhythm:Regular Rate:Bradycardia     Neuro/Psych TIA 4-5 years ago - residual left sided weakness CVA, Residual Symptoms    GI/Hepatic negative GI ROS, Neg liver ROS, GERD-  Controlled and Medicated,  Endo/Other  negative endocrine ROS  Renal/GU negative Renal ROS     Musculoskeletal   Abdominal   Peds  Hematology   Anesthesia Other Findings   Reproductive/Obstetrics                          Anesthesia Physical Anesthesia Plan  ASA: III  Anesthesia Plan: MAC   Post-op Pain Management:    Induction: Intravenous  Airway Management Planned:   Additional Equipment:   Intra-op Plan:   Post-operative Plan:   Informed Consent: I have reviewed the patients History and Physical, chart, labs and discussed the procedure including the risks, benefits and alternatives for the proposed anesthesia with the patient or authorized representative who has indicated his/her understanding and acceptance.   Dental advisory given  Plan Discussed with: CRNA, Anesthesiologist and Surgeon  Anesthesia Plan Comments:         Anesthesia Quick Evaluation

## 2012-02-01 NOTE — Addendum Note (Signed)
Addendum  created 02/01/12 1118 by Lovie Chol, CRNA   Modules edited:Anesthesia Medication Administration

## 2012-02-01 NOTE — Preoperative (Signed)
Beta Blockers   Reason not to administer Beta Blockers:Not Applicable 

## 2012-02-01 NOTE — Op Note (Signed)
Terry Gray 02/01/2012 Cataract: Combined, Nuclear  Procedure: Phacoemulsification, Posterior Chamber Intra-ocular Lens Operative Eye:  right eye  Surgeon: Shade Flood Estimated Blood Loss: minimal Specimens for Pathology:  None Complications: Capsule tear   The patient was prepared and draped in the usual manner for ocular surgery on the right eye. A Cook lid speculum was placed. A peripheral clear corneal incision was made at the surgical limbus centered at the 11:00 meridian. A separate clear corneal stab incision was made with a 15 degree blade at the 2:00 meridian to permit bi-manual technique. Viscoat and  Provisc as an underlying layer next to the capsule was instilled into the anterior chamber through that incision.  A keratome was used to create a self sealing incision entering the anterior chamber at the 11:00 meridian. A capsulorhexis was performed using a bent 25g needle. A radial tear occurred at 12:00.  The lens was hydrodissected and the nucleus was hydrodilineated using a Nichammin cannula. The Chang chopper was inserted and used to rotate the lens to insure adequate lens mobility. The phacoemulsification handpiece was inserted and a combined phaco-chop technique was employed, fracturing the lens into separate sections with subsequent removal with the phaco handpiece.  The I/A cannula was used to remove remaining lens cortex. Provisc was instilled and used to deepen the anterior chamber and posterior capsule bag. The Monarch injector was used to place a folded Acrysof MA50BM PC IOL, + 12.50  diopters, into the sulcus given the radial tear. The Mcpherson forcep was used to place the trailing haptic and the Sinskey lens hook was used to place the haptics in horizontal orientation away form the capsule tear.  After the IOL was placed, the radial tear extended at 6:00 with modest vitreous prolapse.  Miochol was instilled to bring the pupil down and the anterior vitrectomy handpiece was  used to remove vitreous that prolapsed forward inferiorly.   BSS was used to bring IOP to a palpable IOP  Less than . The wound was checked to insure it was watertight. Subconjunctival injections of Ancef 100/0.26ml and Dexamethasone 0.5 ml of a 10mg /72ml solution were placed without complication. The lid speculum and drapes were removed and the patient's eye was patched with Polymixin/Bacitracin ophthalmic ointment. An eye shield was placed and the patient was transferred alert and conversant from the operating room to the post-operative recovery area.   Shade Flood, MD

## 2012-02-01 NOTE — Anesthesia Procedure Notes (Signed)
Procedure Name: MAC Date/Time: 02/01/2012 8:55 AM Performed by: Lovie Chol Pre-anesthesia Checklist: Patient identified, Emergency Drugs available, Suction available, Patient being monitored and Timeout performed Patient Re-evaluated:Patient Re-evaluated prior to inductionOxygen Delivery Method: Nasal cannula Placement Confirmation: positive ETCO2

## 2012-02-06 ENCOUNTER — Encounter (HOSPITAL_COMMUNITY): Payer: Self-pay | Admitting: Ophthalmology

## 2012-03-27 ENCOUNTER — Encounter (HOSPITAL_COMMUNITY): Payer: Self-pay | Admitting: Pharmacy Technician

## 2012-04-03 ENCOUNTER — Encounter: Payer: Self-pay | Admitting: Ophthalmology

## 2012-04-03 ENCOUNTER — Encounter (HOSPITAL_COMMUNITY)
Admission: RE | Disposition: A | Discharge status: Home / Self Care | Payer: Self-pay | Source: Ambulatory Visit | Attending: Ophthalmology

## 2012-04-03 ENCOUNTER — Other Ambulatory Visit: Payer: Self-pay | Admitting: Ophthalmology

## 2012-04-03 ENCOUNTER — Ambulatory Visit (HOSPITAL_COMMUNITY)
Admission: RE | Admit: 2012-04-03 | Discharge: 2012-04-03 | Disposition: A | Discharge status: Home / Self Care | Payer: Medicare Other | Source: Ambulatory Visit | Attending: Ophthalmology | Admitting: Ophthalmology

## 2012-04-03 DIAGNOSIS — H26499 Other secondary cataract, unspecified eye: Secondary | ICD-10-CM | POA: Insufficient documentation

## 2012-04-03 DIAGNOSIS — Z9849 Cataract extraction status, unspecified eye: Secondary | ICD-10-CM | POA: Insufficient documentation

## 2012-04-03 HISTORY — PX: YAG LASER APPLICATION: SHX6189

## 2012-04-03 HISTORY — PX: CAPSULOTOMY: SHX5412

## 2012-04-03 SURGERY — MINOR CAPSULOTOMY
Anesthesia: LOCAL | Laterality: Right

## 2012-04-03 MED ORDER — CYCLOPENTOLATE HCL 1 % OP SOLN
OPHTHALMIC | Status: AC
  Administered 2012-04-03: 1 [drp]
  Filled 2012-04-03: qty 2

## 2012-04-03 MED ORDER — CYCLOPENTOLATE-PHENYLEPHRINE 0.2-1 % OP SOLN: 2.0000 [drp] | Freq: Once | OPHTHALMIC | Status: DC

## 2012-04-03 MED ORDER — APRACLONIDINE HCL 1 % OP SOLN
1.0000 [drp] | Freq: Once | OPHTHALMIC | Status: AC
  Administered 2012-04-03: 1 [drp] via OPHTHALMIC

## 2012-04-03 MED ORDER — APRACLONIDINE HCL 1 % OP SOLN: 1.0000 [drp] | Freq: Once | OPHTHALMIC | Status: DC

## 2012-04-03 MED ORDER — APRACLONIDINE HCL 0.5 % OP SOLN
OPHTHALMIC | Status: AC
  Filled 2012-04-03: qty 5

## 2012-04-03 SURGICAL SUPPLY — 28 items
APPLICATOR COTTON TIP 6IN STRL (MISCELLANEOUS) ×3 IMPLANT
BAG FLD CLT MN 6.25X3.5 (WOUND CARE)
BAG MINI COLL DRAIN (WOUND CARE) IMPLANT
BLADE KERATOME 2.75 (BLADE) ×3 IMPLANT
BLADE MINI RND TIP GREEN BEAV (BLADE) IMPLANT
CLOTH BEACON ORANGE TIMEOUT ST (SAFETY) ×3 IMPLANT
CORDS BIPOLAR (ELECTRODE) ×3 IMPLANT
DRAPE OPHTHALMIC 40X48 W POUCH (DRAPES) ×3 IMPLANT
DRAPE RETRACTOR (MISCELLANEOUS) ×3 IMPLANT
GLOVE ECLIPSE 7.0 STRL STRAW (GLOVE) ×3 IMPLANT
GOWN STRL NON-REIN LRG LVL3 (GOWN DISPOSABLE) ×6 IMPLANT
KIT BASIN OR (CUSTOM PROCEDURE TRAY) ×3 IMPLANT
KIT ROOM TURNOVER OR (KITS) ×3 IMPLANT
KNIFE CRESCENT 2.5 55 ANG (BLADE) ×3 IMPLANT
MARKER SKIN DUAL TIP RULER LAB (MISCELLANEOUS) IMPLANT
NS IRRIG 1000ML POUR BTL (IV SOLUTION) ×3 IMPLANT
PACK CATARACT CUSTOM (CUSTOM PROCEDURE TRAY) ×3 IMPLANT
PACK CATARACT MCHSCP (PACKS) IMPLANT
PAD ARMBOARD 7.5X6 YLW CONV (MISCELLANEOUS) ×6 IMPLANT
PROBE ANTERIOR 20G W/INFUS NDL (MISCELLANEOUS) IMPLANT
SPEAR EYE SURG WECK-CEL (MISCELLANEOUS) IMPLANT
SUT ETHILON 10 0 CS140 6 (SUTURE) IMPLANT
SUT VICRYL 8 0 TG140 8 (SUTURE) IMPLANT
SYR 3ML LL SCALE MARK (SYRINGE) IMPLANT
TIP SILICONE STR (MISCELLANEOUS)
TIP SILICONE STR 0.3MM UFLOW (MISCELLANEOUS) IMPLANT
TOWEL OR 17X24 6PK STRL BLUE (TOWEL DISPOSABLE) ×6 IMPLANT
WATER STERILE IRR 1000ML POUR (IV SOLUTION) ×3 IMPLANT

## 2012-04-03 NOTE — Progress Notes (Signed)
Dr. Mitzi Davenport instructed patient to come to office today at 1630. Patient verbalized understanding. Patient discharged.

## 2012-04-03 NOTE — Brief Op Note (Signed)
04/03/2012  7:48 AM  PATIENT:  Terry Gray  79 y.o. male  PRE-OPERATIVE DIAGNOSIS:  OPACQUE POSTERIOR CAPSULE RIGHT EYE    POST-OPERATIVE DIAGNOSIS:  * No post-op diagnosis entered *  PROCEDURE:  Procedure(s) (LRB) with comments: MINOR CAPSULOTOMY (Right) YAG LASER APPLICATION (N/A)  SURGEON:  Surgeon(s) and Role:    Vita Erm., MD - Primary  PHYSICIAN ASSISTANT:   ASSISTANTS: none   ANESTHESIA:   none  EBL:     BLOOD ADMINISTERED:none  DRAINS: none   LOCAL MEDICATIONS USED:  NONE  SPECIMEN:  No Specimen  DISPOSITION OF SPECIMEN:  N/A  COUNTS:  YES  TOURNIQUET:  * No tourniquets in log *  DICTATION: .Other Dictation: Dictation Number [id number]  PLAN OF CARE: Discharge to home after PACU  PATIENT DISPOSITION:  Short Stay   Delay start of Pharmacological VTE agent (>24hrs) due to surgical blood loss or risk of bleeding: not applicable

## 2012-04-03 NOTE — H&P (Signed)
  79 yo male has had cataract surgery right eye.  Still has blurred vision due to an opaque posterior capsule.  Admitted for yag laser capsulotomy od.

## 2012-04-04 ENCOUNTER — Encounter (HOSPITAL_COMMUNITY): Payer: Self-pay | Admitting: Ophthalmology

## 2012-04-13 NOTE — Op Note (Signed)
NAMEGUSTAF, MCCARTER               ACCOUNT NO.:  [id number]  MEDICAL RECORD NO.:  [id number]  LOCATION:  [facility]                         FACILITY:  [facility]  PHYSICIAN:  Salley Scarlet., M.D.DATE OF BIRTH:  Jan 08, 1934  DATE OF PROCEDURE: DATE OF DISCHARGE:  04/03/2012                              OPERATIVE REPORT   PREOPERATIVE DIAGNOSIS:  Opaque posterior capsule, right eye.  POSTOPERATIVE DIAGNOSIS:  Opaque posterior capsule, right eye.  OPERATION:  YAG laser capsulotomy.  JUSTIFICATION OF PROCEDURE:  This is a 79 year old gentleman, who underwent a cataract extraction of the right eye several weeks ago.  He has not received for postoperative vision potential because of a cloudy posterior capsule.  YAG laser capsulotomy was recommended.  He is admitted at this time for that purpose.  The patient was brought to the laser room and positioned appropriately behind the laser.  Several applications of laser energy were applied to the posterior capsule, obtaining a satisfactory opening.  The patient tolerated procedure well and was discharged to the postanesthesia recovery room with instructions to see me in office at 5 o'clock today for evaluation of the intraocular pressure.  DISCHARGE DIAGNOSIS:  Opaque posterior capsule, right eye.     Salley Scarlet., M.D.     TB/MEDQ  D:  04/13/2012  T:  04/13/2012  Job:  [id number]

## 2012-04-13 NOTE — Op Note (Signed)
Terry Gray, Terry Gray               ACCOUNT NO.:  [id number]  MEDICAL RECORD NO.:  [id number]  LOCATION:  [facility]                         FACILITY:  [facility]  PHYSICIAN:  Salley Scarlet., M.D.DATE OF BIRTH:  Feb 13, 1934  DATE OF PROCEDURE:  04/13/2012 DATE OF DISCHARGE:  04/03/2012                              OPERATIVE REPORT   PREOPERATIVE DIAGNOSIS:  Opaque posterior capsule, right eye.  POSTOPERATIVE DIAGNOSIS:  Opaque posterior capsule, right eye.  OPERATION:  YAG laser capsulotomy.  JUSTIFICATION OF PROCEDURE:  This is a 79 year old gentleman underwent cataract extraction of the right eye several weeks ago.  He has not reached his full postoperative visual potential because of a cloudy posterior capsule.  YAG laser capsulotomy was recommended.  He is admitted at this time for that purpose.  PROCEDURE:  The patient was brought to the laser room where he was positioned appropriately behind the YAG laser.  Several applications of laser energy were applied to the posterior capsule obtaining a satisfactory opening.  The patient tolerated the procedure well, was discharged to the post anesthesia care in satisfactory condition with instructions to see me in the office this afternoon for further evaluation.     Salley Scarlet., M.D.     TB/MEDQ  D:  04/13/2012  T:  04/13/2012  Job:  [id number]

## 2012-10-15 ENCOUNTER — Other Ambulatory Visit: Payer: Self-pay | Admitting: Internal Medicine

## 2012-10-15 DIAGNOSIS — R109 Unspecified abdominal pain: Secondary | ICD-10-CM

## 2012-10-18 ENCOUNTER — Ambulatory Visit
Admission: RE | Admit: 2012-10-18 | Discharge: 2012-10-18 | Disposition: A | Discharge status: Home / Self Care | Payer: Medicare Other | Source: Ambulatory Visit | Attending: Internal Medicine | Admitting: Internal Medicine

## 2012-10-18 DIAGNOSIS — R109 Unspecified abdominal pain: Secondary | ICD-10-CM

## 2013-06-10 ENCOUNTER — Ambulatory Visit (INDEPENDENT_AMBULATORY_CARE_PROVIDER_SITE_OTHER): Payer: Commercial Managed Care - HMO | Admitting: Cardiovascular Disease

## 2013-06-10 ENCOUNTER — Encounter: Payer: Self-pay | Admitting: Cardiovascular Disease

## 2013-06-10 VITALS — BP 127/75 | HR 66 | Ht 60.0 in | Wt 157.0 lb

## 2013-06-10 DIAGNOSIS — R5381 Other malaise: Secondary | ICD-10-CM

## 2013-06-10 DIAGNOSIS — R079 Chest pain, unspecified: Secondary | ICD-10-CM | POA: Insufficient documentation

## 2013-06-10 DIAGNOSIS — E785 Hyperlipidemia, unspecified: Secondary | ICD-10-CM

## 2013-06-10 DIAGNOSIS — R0989 Other specified symptoms and signs involving the circulatory and respiratory systems: Secondary | ICD-10-CM

## 2013-06-10 DIAGNOSIS — R0609 Other forms of dyspnea: Secondary | ICD-10-CM

## 2013-06-10 DIAGNOSIS — R0602 Shortness of breath: Secondary | ICD-10-CM

## 2013-06-10 DIAGNOSIS — R0600 Dyspnea, unspecified: Secondary | ICD-10-CM

## 2013-06-10 DIAGNOSIS — I10 Essential (primary) hypertension: Secondary | ICD-10-CM

## 2013-06-10 DIAGNOSIS — R5383 Other fatigue: Secondary | ICD-10-CM

## 2013-06-10 NOTE — Patient Instructions (Signed)
Your physician recommends that you schedule a follow-up appointment in:   AS NEEDED   Your physician recommends that you continue on your current medications as directed. Please refer to the Current Medication list given to you today.   Your physician has requested that you have a stress echocardiogram. For further information please visit [email or web address]. Please follow instruction sheet as given.  

## 2013-06-10 NOTE — Assessment & Plan Note (Signed)
F/U labs with Dr Donette LarryHusain  Continue statin

## 2013-06-10 NOTE — Addendum Note (Signed)
Addended by: Scherrie BatemanYORK, Saralynn Langhorst E on: 06/10/2013 11:27 AM   Modules accepted: Orders

## 2013-06-10 NOTE — Progress Notes (Signed)
Patient ID: Terry Gray, male   DOB: 18-Aug-1933, 80 y.o.   MRN: [id number]    80 yo from [place]  Referred by Dr Donette LarryHusain for tightness in chest fatigue and dyspnea.  CRF include HTN and elevated lipids on good Rx.  For years has had exertional dyspnea and fatigue.  Thinks he is slowing down  Legs get weak.  When he overdoes it can get tight in chest but this is usually associated with dyspnea No history of asthma or reactive airway disease. No recent stress test.  Had TIA in 2012 Rx by Dr Pearlean BrownieSethi  No etiology found and placed on plavix.  Has done well on this  Somewhat sedentary  Works as an Water engineerinterpretor for doctors. Has 8 children in this country.  Compliant with meds  Pain is non positional and non pleuritic.  No associated nausea , palpitations , synocpe  Non smoker with no history of chronic lung disease cough sputum or fever  Echo form 2012 reviewed Done for TIA  Normal EF mild MR    ROS: Denies fever, malais, weight loss, blurry vision, decreased visual acuity, cough, sputum, SOB, hemoptysis, pleuritic pain, palpitaitons, heartburn, abdominal pain, melena, lower extremity edema, claudication, or rash.  All other systems reviewed and negative   General: Affect appropriate Healthy:  appears stated age HEENT: poor dentition  Neck supple with no adenopathy JVP normal no bruits no thyromegaly Lungs clear with no wheezing and good diaphragmatic motion Heart:  S1/S2 no murmur,rub, gallop or click PMI normal Abdomen: benighn, BS positve, no tenderness, no AAA no bruit.  No HSM or HJR Distal pulses intact with no bruits No edema Neuro non-focal Skin warm and dry No muscular weakness  Medications Current Outpatient Prescriptions  Medication Sig Dispense Refill  . atorvastatin (LIPITOR) 20 MG tablet Take 10 mg by mouth daily.      . Cholecalciferol (VITAMIN D) 2000 UNITS CAPS Take by mouth.      . clopidogrel (PLAVIX) 75 MG tablet Take 75 mg by mouth daily.      . famotidine (PEPCID) 40 MG  tablet Take 40 mg by mouth daily.      . finasteride (PROSCAR) 5 MG tablet Take 5 mg by mouth daily.      . hydrochlorothiazide (HYDRODIURIL) 25 MG tablet Take 25 mg by mouth daily.      . Omega 3-6-9 Fatty Acids (TRIPLE OMEGA-3-6-9) CAPS Take 1 capsule by mouth daily.      . Psyllium (METAMUCIL) 48.57 % POWD Take by mouth.      . Saw Palmetto 450 MG CAPS Take 1 capsule by mouth daily.      . sildenafil (VIAGRA) 50 MG tablet Take 50 mg by mouth daily as needed. For erectile dysfunction      . vitamin C (ASCORBIC ACID) 500 MG tablet Take 500 mg by mouth daily.       No current facility-administered medications for this visit.    Allergies Review of patient's allergies indicates no known allergies.  Family History: No family history on file.  Social History: History   Social History  . Marital Status: Married    Spouse Name: N/A    Number of Children: N/A  . Years of Education: N/A   Occupational History  . Not on file.   Social History Main Topics  . Smoking status: Former Games developermoker  . Smokeless tobacco: Not on file  . Alcohol Use: Not on file     Comment: occasional  . Drug Use:  No  . Sexual Activity: Not on file   Other Topics Concern  . Not on file   Social History Narrative  . No narrative on file    Electrocardiogram:  NSR rate 56 normal   Assessment and Plan

## 2013-06-10 NOTE — Assessment & Plan Note (Signed)
Atypical normal ECG with dyspnea and history of MR  F/U stress echo

## 2013-06-10 NOTE — Assessment & Plan Note (Signed)
Seems functional  Normal ECG and cardiopulmonary exam  EF normal by echo in 2012  With mild MR  F/U stress echo

## 2013-06-10 NOTE — Assessment & Plan Note (Signed)
Well controlled.  Continue current medications and low sodium Dash type diet.    

## 2013-07-05 ENCOUNTER — Observation Stay (HOSPITAL_COMMUNITY)
Admission: EM | Admit: 2013-07-05 | Discharge: 2013-07-06 | Disposition: A | Discharge status: Home / Self Care | Payer: Medicare HMO | Attending: Internal Medicine | Admitting: Internal Medicine

## 2013-07-05 ENCOUNTER — Emergency Department (HOSPITAL_COMMUNITY): Payer: Medicare HMO

## 2013-07-05 ENCOUNTER — Encounter (HOSPITAL_COMMUNITY): Payer: Self-pay | Admitting: General Practice

## 2013-07-05 DIAGNOSIS — G459 Transient cerebral ischemic attack, unspecified: Secondary | ICD-10-CM | POA: Diagnosis present

## 2013-07-05 DIAGNOSIS — R0602 Shortness of breath: Secondary | ICD-10-CM | POA: Insufficient documentation

## 2013-07-05 DIAGNOSIS — IMO0001 Reserved for inherently not codable concepts without codable children: Secondary | ICD-10-CM | POA: Insufficient documentation

## 2013-07-05 DIAGNOSIS — Z7902 Long term (current) use of antithrombotics/antiplatelets: Secondary | ICD-10-CM | POA: Insufficient documentation

## 2013-07-05 DIAGNOSIS — R55 Syncope and collapse: Secondary | ICD-10-CM | POA: Insufficient documentation

## 2013-07-05 DIAGNOSIS — Z79899 Other long term (current) drug therapy: Secondary | ICD-10-CM | POA: Insufficient documentation

## 2013-07-05 DIAGNOSIS — Z8673 Personal history of transient ischemic attack (TIA), and cerebral infarction without residual deficits: Secondary | ICD-10-CM | POA: Insufficient documentation

## 2013-07-05 DIAGNOSIS — E785 Hyperlipidemia, unspecified: Secondary | ICD-10-CM | POA: Diagnosis present

## 2013-07-05 DIAGNOSIS — R5381 Other malaise: Secondary | ICD-10-CM | POA: Insufficient documentation

## 2013-07-05 DIAGNOSIS — R0600 Dyspnea, unspecified: Secondary | ICD-10-CM | POA: Diagnosis present

## 2013-07-05 DIAGNOSIS — I4891 Unspecified atrial fibrillation: Secondary | ICD-10-CM

## 2013-07-05 DIAGNOSIS — Z87891 Personal history of nicotine dependence: Secondary | ICD-10-CM | POA: Insufficient documentation

## 2013-07-05 DIAGNOSIS — I10 Essential (primary) hypertension: Secondary | ICD-10-CM | POA: Diagnosis present

## 2013-07-05 DIAGNOSIS — L301 Dyshidrosis [pompholyx]: Secondary | ICD-10-CM | POA: Insufficient documentation

## 2013-07-05 DIAGNOSIS — R112 Nausea with vomiting, unspecified: Principal | ICD-10-CM | POA: Insufficient documentation

## 2013-07-05 DIAGNOSIS — R42 Dizziness and giddiness: Secondary | ICD-10-CM | POA: Diagnosis present

## 2013-07-05 DIAGNOSIS — R079 Chest pain, unspecified: Secondary | ICD-10-CM | POA: Diagnosis present

## 2013-07-05 DIAGNOSIS — R5383 Other fatigue: Secondary | ICD-10-CM

## 2013-07-05 HISTORY — DX: Low back pain: M54.5

## 2013-07-05 HISTORY — DX: Gastro-esophageal reflux disease without esophagitis: K21.9

## 2013-07-05 HISTORY — DX: Dizziness and giddiness: R42

## 2013-07-05 HISTORY — DX: Transient cerebral ischemic attack, unspecified: G45.9

## 2013-07-05 HISTORY — DX: Other chronic pain: G89.29

## 2013-07-05 HISTORY — DX: Unspecified viral hepatitis B without hepatic coma: B19.10

## 2013-07-05 HISTORY — DX: Other complications of anesthesia, initial encounter: T88.59XA

## 2013-07-05 HISTORY — DX: Low back pain, unspecified: M54.50

## 2013-07-05 HISTORY — DX: Pure hypercholesterolemia, unspecified: E78.00

## 2013-07-05 HISTORY — DX: Adverse effect of unspecified anesthetic, initial encounter: T41.45XA

## 2013-07-05 HISTORY — DX: Unspecified atrial fibrillation: I48.91

## 2013-07-05 LAB — CBC
HCT: 41.2 % (ref 39.0–52.0)
HCT: 41.2 % (ref 39.0–52.0)
Hemoglobin: 13.7 g/dL (ref 13.0–17.0)
Hemoglobin: 13.9 g/dL (ref 13.0–17.0)
MCH: 28 pg (ref 26.0–34.0)
MCH: 28.4 pg (ref 26.0–34.0)
MCHC: 33.3 g/dL (ref 30.0–36.0)
MCHC: 33.7 g/dL (ref 30.0–36.0)
MCV: 84.1 fL (ref 78.0–100.0)
MCV: 84.3 fL (ref 78.0–100.0)
Platelets: 134 10*3/uL — ABNORMAL LOW (ref 150–400)
Platelets: 149 10*3/uL — ABNORMAL LOW (ref 150–400)
RBC: 4.9 MIL/uL (ref 4.22–5.81)
RBC: 4.89 MIL/uL (ref 4.22–5.81)
RDW: 13.2 % (ref 11.5–15.5)
RDW: 13.2 % (ref 11.5–15.5)
WBC: 8 10*3/uL (ref 4.0–10.5)
WBC: 7.7 10*3/uL (ref 4.0–10.5)

## 2013-07-05 LAB — TROPONIN I
Troponin I: <0.3 ng/mL (ref <0.30)
Troponin I: <0.3 ng/mL (ref <0.30)

## 2013-07-05 LAB — URINALYSIS, ROUTINE W REFLEX MICROSCOPIC
Bilirubin Urine: NEGATIVE
Glucose, UA: NEGATIVE mg/dL
Hgb urine dipstick: NEGATIVE
Ketones, ur: NEGATIVE mg/dL
Leukocytes, UA: NEGATIVE
Nitrite: NEGATIVE
Protein, ur: NEGATIVE mg/dL
Specific Gravity, Urine: 1.024 (ref 1.005–1.030)
Urobilinogen, UA: 0.2 mg/dL (ref 0.0–1.0)
pH: 7.5 (ref 5.0–8.0)

## 2013-07-05 LAB — BASIC METABOLIC PANEL
BUN: 16 mg/dL (ref 6–23)
CO2: 24 mEq/L (ref 19–32)
Calcium: 8.9 mg/dL (ref 8.4–10.5)
Chloride: 103 mEq/L (ref 96–112)
Creatinine, Ser: 0.9 mg/dL (ref 0.50–1.35)
GFR calc Af Amer: >90 mL/min (ref >90)
GFR calc non Af Amer: 80 mL/min — ABNORMAL LOW (ref >90)
Glucose, Bld: 122 mg/dL — ABNORMAL HIGH (ref 70–99)
Potassium: 3.8 mEq/L (ref 3.7–5.3)
Sodium: 140 mEq/L (ref 137–147)

## 2013-07-05 LAB — CREATININE, SERUM
Creatinine, Ser: 0.84 mg/dL (ref 0.50–1.35)
GFR calc Af Amer: >90 mL/min (ref >90)
GFR calc non Af Amer: 82 mL/min — ABNORMAL LOW (ref >90)

## 2013-07-05 MED ORDER — TRIPLE OMEGA-3-6-9 PO CAPS: 1.0000 | ORAL_CAPSULE | Freq: Every day | ORAL | Status: DC

## 2013-07-05 MED ORDER — FAMOTIDINE 40 MG PO TABS
40.0000 mg | ORAL_TABLET | Freq: Every day | ORAL | Status: DC
  Administered 2013-07-05 – 2013-07-06 (×2): 40 mg via ORAL
  Filled 2013-07-05 (×2): qty 1

## 2013-07-05 MED ORDER — ASPIRIN EC 81 MG PO TBEC
81.0000 mg | DELAYED_RELEASE_TABLET | Freq: Once | ORAL | Status: AC
  Administered 2013-07-05: 81 mg via ORAL
  Filled 2013-07-05: qty 1

## 2013-07-05 MED ORDER — SODIUM CHLORIDE 0.9 % IJ SOLN
3.0000 mL | Freq: Two times a day (BID) | INTRAMUSCULAR | Status: DC
  Administered 2013-07-05 – 2013-07-06 (×2): 3 mL via INTRAVENOUS

## 2013-07-05 MED ORDER — RAMIPRIL 10 MG PO CAPS
10.0000 mg | ORAL_CAPSULE | Freq: Every day | ORAL | Status: DC
  Administered 2013-07-05 – 2013-07-06 (×2): 10 mg via ORAL
  Filled 2013-07-05 (×2): qty 1

## 2013-07-05 MED ORDER — MECLIZINE HCL 25 MG PO TABS
25.0000 mg | ORAL_TABLET | Freq: Three times a day (TID) | ORAL | Status: DC | PRN
  Administered 2013-07-05: 25 mg via ORAL
  Filled 2013-07-05 (×2): qty 1

## 2013-07-05 MED ORDER — ACETAMINOPHEN 650 MG RE SUPP: 650.0000 mg | Freq: Four times a day (QID) | RECTAL | Status: DC | PRN

## 2013-07-05 MED ORDER — ONDANSETRON HCL 4 MG/2ML IJ SOLN
4.0000 mg | Freq: Once | INTRAMUSCULAR | Status: AC
  Administered 2013-07-05: 4 mg via INTRAVENOUS
  Filled 2013-07-05: qty 2

## 2013-07-05 MED ORDER — OMEGA-3-ACID ETHYL ESTERS 1 G PO CAPS
1.0000 g | ORAL_CAPSULE | Freq: Every day | ORAL | Status: DC
  Administered 2013-07-05 – 2013-07-06 (×2): 1 g via ORAL
  Filled 2013-07-05 (×2): qty 1

## 2013-07-05 MED ORDER — MECLIZINE HCL 25 MG PO TABS
25.0000 mg | ORAL_TABLET | Freq: Once | ORAL | Status: AC
  Administered 2013-07-05: 25 mg via ORAL
  Filled 2013-07-05: qty 1

## 2013-07-05 MED ORDER — VITAMIN D 50 MCG (2000 UT) PO CAPS: 2000.0000 [IU] | ORAL_CAPSULE | Freq: Every day | ORAL | Status: DC

## 2013-07-05 MED ORDER — HYDROCHLOROTHIAZIDE 25 MG PO TABS
25.0000 mg | ORAL_TABLET | Freq: Every day | ORAL | Status: DC
  Administered 2013-07-05 – 2013-07-06 (×2): 25 mg via ORAL
  Filled 2013-07-05 (×2): qty 1

## 2013-07-05 MED ORDER — ATORVASTATIN CALCIUM 10 MG PO TABS
10.0000 mg | ORAL_TABLET | Freq: Every day | ORAL | Status: DC
  Administered 2013-07-05: 10 mg via ORAL
  Filled 2013-07-05 (×2): qty 1

## 2013-07-05 MED ORDER — FINASTERIDE 5 MG PO TABS
5.0000 mg | ORAL_TABLET | Freq: Every day | ORAL | Status: DC
  Administered 2013-07-05 – 2013-07-06 (×2): 5 mg via ORAL
  Filled 2013-07-05 (×2): qty 1

## 2013-07-05 MED ORDER — HEPARIN SODIUM (PORCINE) 5000 UNIT/ML IJ SOLN
5000.0000 [IU] | Freq: Three times a day (TID) | INTRAMUSCULAR | Status: DC
  Administered 2013-07-05 – 2013-07-06 (×4): 5000 [IU] via SUBCUTANEOUS
  Filled 2013-07-05 (×6): qty 1

## 2013-07-05 MED ORDER — CLOPIDOGREL BISULFATE 75 MG PO TABS
75.0000 mg | ORAL_TABLET | Freq: Every day | ORAL | Status: DC
  Administered 2013-07-05 – 2013-07-06 (×2): 75 mg via ORAL
  Filled 2013-07-05 (×2): qty 1

## 2013-07-05 MED ORDER — VITAMIN D3 25 MCG (1000 UNIT) PO TABS
2000.0000 [IU] | ORAL_TABLET | Freq: Every day | ORAL | Status: DC
  Administered 2013-07-05 – 2013-07-06 (×2): 2000 [IU] via ORAL
  Filled 2013-07-05 (×2): qty 2

## 2013-07-05 MED ORDER — ONDANSETRON HCL 4 MG PO TABS: 4.0000 mg | ORAL_TABLET | Freq: Four times a day (QID) | ORAL | Status: DC | PRN

## 2013-07-05 MED ORDER — ACETAMINOPHEN 325 MG PO TABS
650.0000 mg | ORAL_TABLET | Freq: Four times a day (QID) | ORAL | Status: DC | PRN
  Administered 2013-07-05: 650 mg via ORAL
  Filled 2013-07-05: qty 2

## 2013-07-05 MED ORDER — GADOBENATE DIMEGLUMINE 529 MG/ML IV SOLN
15.0000 mL | Freq: Once | INTRAVENOUS | Status: AC
  Administered 2013-07-05: 15 mL via INTRAVENOUS

## 2013-07-05 MED ORDER — ONDANSETRON HCL 4 MG/2ML IJ SOLN: 4.0000 mg | Freq: Four times a day (QID) | INTRAMUSCULAR | Status: DC | PRN

## 2013-07-05 NOTE — ED Notes (Signed)
EMS called to house after Pt. Woke up c/o nausea/vomiting around 0400.

## 2013-07-05 NOTE — ED Notes (Signed)
PA at bedside. Neuro

## 2013-07-05 NOTE — ED Provider Notes (Signed)
CSN: [id number]     Arrival date & time 07/05/13  0548 History   First MD Initiated Contact with Patient 07/05/13 [phone]     Chief Complaint  Patient presents with  . Nausea  . Emesis     (Consider location/radiation/quality/duration/timing/severity/associated sxs/prior Treatment) HPI Pt is a 80yo male with hx of TIA, HTN, DOE, SOB, chest pain and elevated lipids brought to ED via EMS c/o nausea and vomiting that awoke him around 4am this morning. Pt states he woke up this morning dizzy, reports room was spinning.  States he became diaphoretic and difficulty ambulating due to dizziness.  Pt reports gradually worsening SOB and generalized weakness for several months. Reports bilateral leg pain that started 2 days ago, pain is constant, aching, mild to moderate, worse with activity.  Pt states he is scheduled for stress test on Monday, 5/4.  Denies known hx of arrhythmia, PE, or DVT. Denies sick contacts or recent travel.  Denies chest pain or palpitations. Pt is on plavix due to TIA of unknown origin in 2012.  Past Medical History  Diagnosis Date  . Stroke   . Hypertension   . Shortness of breath   . DOE (dyspnea on exertion)    Past Surgical History  Procedure Laterality Date  . No past surgeries    . Cataract extraction w/phaco  02/01/2012    Procedure: CATARACT EXTRACTION PHACO AND INTRAOCULAR LENS PLACEMENT (IOC);  Surgeon: Shade FloodGreer Geiger, MD;  [facility];  Service: Ophthalmology;  Laterality: Right;  . Capsulotomy  04/03/2012    Procedure: MINOR CAPSULOTOMY;  Surgeon: Vita Ermhomas E Brewington Jr., MD;  [facility];  Service: Ophthalmology;  Laterality: Right;  . Yag laser application  04/03/2012    Procedure: YAG LASER APPLICATION;  Surgeon: Vita Ermhomas E Brewington Jr., MD;  [facility];  Service: Ophthalmology;  Laterality: N/A;   No family history on file. History  Substance Use Topics  . Smoking status: Former Games developermoker  . Smokeless tobacco: Not on file  . Alcohol Use: Not on  file     Comment: occasional    Review of Systems  Constitutional: Positive for diaphoresis and fatigue. Negative for fever and chills.  Respiratory: Positive for shortness of breath. Negative for cough.   Cardiovascular: Negative for chest pain, palpitations and leg swelling.  Gastrointestinal: Positive for nausea and vomiting. Negative for abdominal pain.  Genitourinary: Negative for dysuria, frequency, hematuria and flank pain.  Musculoskeletal: Positive for myalgias ( bilateral leg pain). Negative for back pain.  Neurological: Positive for weakness ( generalized).  All other systems reviewed and are negative.     Allergies  Review of patient's allergies indicates no known allergies.  Home Medications   Prior to Admission medications   Medication Sig Start Date End Date Taking? Authorizing Provider  atorvastatin (LIPITOR) 20 MG tablet Take 10 mg by mouth daily.    Historical Provider, MD  Cholecalciferol (VITAMIN D) 2000 UNITS CAPS Take by mouth.    Historical Provider, MD  clopidogrel (PLAVIX) 75 MG tablet Take 75 mg by mouth daily.    Historical Provider, MD  famotidine (PEPCID) 40 MG tablet Take 40 mg by mouth daily.    Historical Provider, MD  finasteride (PROSCAR) 5 MG tablet Take 5 mg by mouth daily.    Historical Provider, MD  hydrochlorothiazide (HYDRODIURIL) 25 MG tablet Take 25 mg by mouth daily.    Historical Provider, MD  Omega 3-6-9 Fatty Acids (TRIPLE OMEGA-3-6-9) CAPS Take 1 capsule by mouth daily.  Historical Provider, MD  Psyllium (METAMUCIL) 48.57 % POWD Take by mouth.    Historical Provider, MD  Saw Palmetto 450 MG CAPS Take 1 capsule by mouth daily.    Historical Provider, MD  sildenafil (VIAGRA) 50 MG tablet Take 50 mg by mouth daily as needed. For erectile dysfunction    Historical Provider, MD  vitamin C (ASCORBIC ACID) 500 MG tablet Take 500 mg by mouth daily.    Historical Provider, MD   BP 129/75  Pulse 64  Temp(Src) 97.5 F (36.4 C) (Oral)  Resp  21  Ht 5\' 4"  (1.626 m)  Wt 160 lb (72.576 kg)  BMI 27.45 kg/m2  SpO2 95% Physical Exam  Nursing note and vitals reviewed. Constitutional: He appears well-developed and well-nourished.  Pt lying comfortably in exam bed, appears mildly fatigued, nasal canula in place.  HENT:  Head: Normocephalic and atraumatic.  Eyes: Conjunctivae are normal. No scleral icterus.  Neck: Normal range of motion. Neck supple.  Cardiovascular: Normal rate and normal heart sounds.  An irregularly irregular rhythm present.  Pulmonary/Chest: Effort normal and breath sounds normal. No respiratory distress. He has no wheezes. He has no rales. He exhibits no tenderness.  Mild shortness of breath. Lungs: CTAB no wheezing or rhonchi appreciated  Abdominal: Soft. Bowel sounds are normal. He exhibits no distension and no mass. There is no tenderness. There is no rebound and no guarding.  Musculoskeletal: Normal range of motion. He exhibits no edema.  No pedal edema, calf tenderness, or swelling.  Neurological: He is alert. No cranial nerve deficit. GCS eye subscore is 4. GCS verbal subscore is 5. GCS motor subscore is 6.  CN II-XII grossly in tact, normal finger to nose.   Ataxic gait  Skin: Skin is warm and dry. No erythema.    ED Course  Procedures (including critical care time) Labs Review Labs Reviewed  CBC - Abnormal; Notable for the following:    Platelets 134 (*)    All other components within normal limits  BASIC METABOLIC PANEL - Abnormal; Notable for the following:    Glucose, Bld 122 (*)    GFR calc non Af Amer 80 (*)    All other components within normal limits  TROPONIN I  URINALYSIS, ROUTINE W REFLEX MICROSCOPIC    Imaging Review Ct Head Wo Contrast  07/05/2013   CLINICAL DATA:  Nausea, vomiting and vertigo. Weakness. History of stroke and hypertension.  EXAM: CT HEAD WITHOUT CONTRAST  TECHNIQUE: Contiguous axial images were obtained from the base of the skull through the vertex without  intravenous contrast.  COMPARISON:  08/04/2010.  FINDINGS: No mass lesion, mass effect, midline shift, hydrocephalus, hemorrhage. No acute territorial cortical ischemia/infarct. Atrophy and chronic ischemic white matter disease is present. Right lens extraction. Scout images appear within normal limits. Mild ethmoid mucosal thickening.  IMPRESSION: Atrophy and chronic ischemic white matter disease without acute intracranial abnormality.   Electronically Signed   By: Andreas Newport M.D.   On: 07/05/2013 07:08   Dg Chest Portable 1 View  07/05/2013   CLINICAL DATA:  Shortness of breath, chest pain, nausea and weakness. Vomiting.  EXAM: PORTABLE CHEST - 1 VIEW  COMPARISON:  Chest radiograph performed 01/23/2012  FINDINGS: The lungs are well-aerated and clear. There is no evidence of focal opacification, pleural effusion or pneumothorax.  The cardiomediastinal silhouette is within normal limits. No acute osseous abnormalities are seen.  IMPRESSION: No acute cardiopulmonary process seen.   Electronically Signed   By: Beryle Beams.D.  On: 07/05/2013 06:47     EKG Interpretation See original EKG on Dr. Arlys JohnBrian Miller's note. Evidence of rate controlled afib upon arrival to ED     Date: 07/05/2013     07:21 AM  Rate: 72  Rhythm: normal sinus rhythm  QRS Axis: normal  Intervals: PR prolonged  ST/T Wave abnormalities: normal  Conduction Disutrbances:none  Narrative Interpretation: NSR with prolonged PR interval, abnormal ECG.  Confirmed by Dr. Eber HongBrian Miller.   Old EKG Reviewed: changes noted and converted back to NSR from afib    MDM   Final diagnoses:  Vertigo  Nausea & vomiting    Pt with hx of TIA presenting to ED c/o vertigo type symptoms associated with nausea and vomiting.  Pt also reports SOB. denies chest pain.  Will get cardiac workup: CBC, BMP, Troponin, as well as CT head.    EKG: appears to be new onset afib, rate controlled.  Labs: unremarkable Head CT: normal   7:20 AM Pt  ambulated after head CT, ataxic gait present with nausea.  Will start pt on zofran and meclozine. Dr. Hyacinth MeekerMiller requested neurology consult for pt, concern for posterior infarct, will get MR brain and MRA neck.  Neuro consult orders placed.  7:21 AM- pt has converted back to NSR w/o medical intervention.   7:35 AM Consulted with Dr. Roseanne RenoStewart who stated he will f/u with pt after MR brain and MRA neck.   Discussed pt with Dr. Hyacinth MeekerMiller.   Will consult with hospitalist to admit pt as he remains ataxic and unable to keep down fluids w/o vomiting.   8:00 AM Consulted with Dr. Rhona Leavenshiu, internal medicine, pt will be admitted for observation telemetry bed. temporary orders placed.     Junius Finnerrin O'Malley, PA-C 07/05/13 1132

## 2013-07-05 NOTE — ED Notes (Signed)
Patient transported to CT 

## 2013-07-05 NOTE — ED Notes (Signed)
Patient transported to MRI 

## 2013-07-05 NOTE — ED Provider Notes (Signed)
80 year old male, has had approximately 2 months of generalized weakness prompting his family doctor to refer him to a cardiologist for a stress test. He has not yet had this, he presents this morning because of acute onset of nausea and vomiting associated with a feeling of vertigo that happened at home. He had extreme difficulty ambulating because of dizziness and feeling off balance. Currently his symptoms have improved however he has had multiple episodes of nausea and vomiting prior to arrival. He states that these episodes were associated with dizziness and sweating. He denies any focal weakness of his arms or legs but has generalized weakness. He denies fevers chills coughing diarrhea or swelling. He endorses a feeling of shortness of breath. He has no history of arrhythmia.  On exam the patient has a soft abdomen, clear heart and lung sounds though his rate seems to be in a normal range his rhythm is slightly irregular. He has no peripheral edema, soft abdomen, no edema, normal strength of all 4 extremities, able to straight leg raise against resistance, normal finger-nose-finger, normal speech, normal memory, cranial nerves III through XII intact except for a slight disconjugate gaze.  The patient will need a gait exam, he has no truncal or peripheral ataxia while in bed. CT scan of the head. Would consider both peripheral or central sources of the patient's vertigo and dizziness. Again he does not have the symptoms at this time though he is still short of breath.  Atrial fibrillation appears to be a new finding. He is rate controlled with a pulse of around 60 beats per minute and his EKG shows no signs of acute ischemia.  ED ECG REPORT  I personally interpreted this EKG   Date: 07/05/2013   Rate: 63  Rhythm: atrial fibrillation  QRS Axis: normal  Intervals: normal  ST/T Wave abnormalities: normal  Conduction Disutrbances:none  Narrative Interpretation:   Old EKG Reviewed: Compared with  06/10/2013, no significant changes to QRS morphology, rhythm changed to atrial fibrillation   Medical screening examination/treatment/procedure(s) were conducted as a shared visit with non-physician practitioner(s) and myself.  I personally evaluated the patient during the encounter.  Clinical Impression afib, dizziness, n/v      Vida RollerBrian D Curties Conigliaro, MD 07/08/13 1730

## 2013-07-05 NOTE — H&P (Signed)
[facility] History and Physical  Terry Gray Petrasek ZOX:[id number] DOB: [birth year] DOA: 07/05/2013  Referring physician: Emergency Department PCP: Georgann HousekeeperHUSAIN,KARRAR, MD  Specialists:   Chief Complaint: Vertigo  HPI: Terry Gray Bubel is a 80 y.o. male  With a hx of htn who presents with acute vertigo symptoms associated with n/v upon awakening on the AM of admission. Pt was transported to ED via EMS where pt was noted to have new onset afib. Vertiginous symptoms reportedly improved with holding head still, worse with movement. Given concerns for possible CVA/TIA, Neurology was consulted. Hospitalist was consulted for consideration for admission.  Review of Systems:  Per above, the remainder of the 10pt ros reviewed and are neg  Past Medical History  Diagnosis Date  . Stroke   . Hypertension   . Shortness of breath   . DOE (dyspnea on exertion)    Past Surgical History  Procedure Laterality Date  . No past surgeries    . Cataract extraction w/phaco  02/01/2012    Procedure: CATARACT EXTRACTION PHACO AND INTRAOCULAR LENS PLACEMENT (IOC);  Surgeon: Shade FloodGreer Geiger, MD;  [facility];  Service: Ophthalmology;  Laterality: Right;  . Capsulotomy  04/03/2012    Procedure: MINOR CAPSULOTOMY;  Surgeon: Vita Ermhomas E Brewington Jr., MD;  [facility];  Service: Ophthalmology;  Laterality: Right;  . Yag laser application  04/03/2012    Procedure: YAG LASER APPLICATION;  Surgeon: Vita Ermhomas E Brewington Jr., MD;  [facility];  Service: Ophthalmology;  Laterality: N/A;   Social History:  reports that he has quit smoking. He does not have any smokeless tobacco history on file. He reports that he does not use illicit drugs. His alcohol history is not on file.  where does patient live--home, ALF, SNF? and with whom if at home?  Can patient participate in ADLs?  No Known Allergies  No family history on file.  (be sure to complete)  Prior to Admission medications   Medication Sig Start Date End Date  Taking? Authorizing Provider  atorvastatin (LIPITOR) 20 MG tablet Take 10 mg by mouth daily.    Historical Provider, MD  Cholecalciferol (VITAMIN D) 2000 UNITS CAPS Take by mouth.    Historical Provider, MD  clopidogrel (PLAVIX) 75 MG tablet Take 75 mg by mouth daily.    Historical Provider, MD  famotidine (PEPCID) 40 MG tablet Take 40 mg by mouth daily.    Historical Provider, MD  finasteride (PROSCAR) 5 MG tablet Take 5 mg by mouth daily.    Historical Provider, MD  hydrochlorothiazide (HYDRODIURIL) 25 MG tablet Take 25 mg by mouth daily.    Historical Provider, MD  Omega 3-6-9 Fatty Acids (TRIPLE OMEGA-3-6-9) CAPS Take 1 capsule by mouth daily.    Historical Provider, MD  Psyllium (METAMUCIL) 48.57 % POWD Take by mouth.    Historical Provider, MD  Saw Palmetto 450 MG CAPS Take 1 capsule by mouth daily.    Historical Provider, MD  sildenafil (VIAGRA) 50 MG tablet Take 50 mg by mouth daily as needed. For erectile dysfunction    Historical Provider, MD  vitamin C (ASCORBIC ACID) 500 MG tablet Take 500 mg by mouth daily.    Historical Provider, MD   Physical Exam: Filed Vitals:   07/05/13 0555 07/05/13 0601  BP:  128/68  Pulse:  60  Temp:  97.5 F (36.4 C)  TempSrc:  Oral  Resp:  18  Height:  5\' 4"  (1.626 m)  Weight:  72.576 kg (160 lb)  SpO2: 93% 98%  General:  Awake, in nad  Eyes: PERRL B  ENT: membranes moist, dentition fair  Neck: trachea midline, neck supple   Cardiovascular: regular, s1, s2  Respiratory: normal resp effort, no wheezing  Abdomen: soft, nondistended  Skin: no abnormal skin lesions seen, normal skin turgor  Musculoskeletal: perfused, no clubbing  Psychiatric: mood/affect normal // no auditory/visual hallucinations  Neurologic: cn2-12 intact, strength/sensation intact  Labs on Admission:  Basic Metabolic Panel:  Recent Labs Lab 07/05/13 0627  NA 140  K 3.8  CL 103  CO2 24  GLUCOSE 122*  BUN 16  CREATININE 0.90  CALCIUM 8.9   Liver  Function Tests: No results found for this basename: AST, ALT, ALKPHOS, BILITOT, PROT, ALBUMIN,  in the last 168 hours No results found for this basename: LIPASE, AMYLASE,  in the last 168 hours No results found for this basename: AMMONIA,  in the last 168 hours CBC:  Recent Labs Lab 07/05/13 0627  WBC 8.0  HGB 13.7  HCT 41.2  MCV 84.1  PLT 134*   Cardiac Enzymes:  Recent Labs Lab 07/05/13 0627  TROPONINI <0.30    BNP (last 3 results) No results found for this basename: PROBNP,  in the last 8760 hours CBG: No results found for this basename: GLUCAP,  in the last 168 hours  Radiological Exams on Admission: Ct Head Wo Contrast  07/05/2013   CLINICAL DATA:  Nausea, vomiting and vertigo. Weakness. History of stroke and hypertension.  EXAM: CT HEAD WITHOUT CONTRAST  TECHNIQUE: Contiguous axial images were obtained from the base of the skull through the vertex without intravenous contrast.  COMPARISON:  08/04/2010.  FINDINGS: No mass lesion, mass effect, midline shift, hydrocephalus, hemorrhage. No acute territorial cortical ischemia/infarct. Atrophy and chronic ischemic white matter disease is present. Right lens extraction. Scout images appear within normal limits. Mild ethmoid mucosal thickening.  IMPRESSION: Atrophy and chronic ischemic white matter disease without acute intracranial abnormality.   Electronically Signed   By: Andreas Newport M.D.   On: 07/05/2013 07:08   Dg Chest Portable 1 View  07/05/2013   CLINICAL DATA:  Shortness of breath, chest pain, nausea and weakness. Vomiting.  EXAM: PORTABLE CHEST - 1 VIEW  COMPARISON:  Chest radiograph performed 01/23/2012  FINDINGS: The lungs are well-aerated and clear. There is no evidence of focal opacification, pleural effusion or pneumothorax.  The cardiomediastinal silhouette is within normal limits. No acute osseous abnormalities are seen.  IMPRESSION: No acute cardiopulmonary process seen.   Electronically Signed   By: Roanna Raider  M.D.   On: 07/05/2013 06:47    EKG: Independently reviewed. NSR  Assessment/Plan Principal Problem:   Vertigo Active Problems:   Chest pain   Dyspnea   Essential hypertension   Elevated lipids   1. Vertigo 1. Sx improved with holding head still - peripheral source for vertigo? 2. MRI ordered, pending 3. Neurology consulted 4. Currently on plavix 5. Admit to med-tele 2. Afib 1. Presently regular rhythm 2. Will check 2d echo 3. Follow serial cardiac enzymes 4. Already on asa and plavix 5. Per family, pt has hx of "light stroke" 6. Given CHADS-Vasc of 4, consider full anticoagulation for stroke prevention? 7. Will defer to Neurology, who has been consulted through ED 3. HTN 1. BP stable 2. Cont home meds 4. DVT prophylaxis 1. Heparin subq  Code Status: Full (must indicate code status--if unknown or must be presumed, indicate so) Family Communication: Pt and family in room (indicate person spoken with, if applicable, with  phone number if by telephone) Disposition Plan: Pending (indicate anticipated LOS)  Time spent: 35min  Jerald KiefStephen K Amoni Scallan [facility] Pager [phone]  If 7PM-7AM, please contact night-coverage [email or web address] Password [email or web address] 07/05/2013, 8:03 AM

## 2013-07-05 NOTE — Consult Note (Signed)
Referring Physician: Rhona Leavens    Chief Complaint: vertigo, N/V, diaphoresis and word finding difficulty  HPI:                                                                                                                                         Terry Gray is an 80 y.o. male with history of CVA leaving him with right arm and leg decreased sensation.  Patient was last normal prior to going to bed last night.  He woke up this morning feeling vertigo moving from right to left, nausea, vomiting, diaphoresis and per daughter some word finding difficulty. His family was concerned this may be another CVA and brought him to the ED. Currently his symptoms of vertigo have resolved, speech is clear and his main complaint is a HA.   Patient had questionable run of Afib while in ED.   Date last known well: Date: 07/04/2013 Time last known well: Time: 21:00 tPA Given: No: out of window  Past Medical History  Diagnosis Date  . Stroke   . Hypertension   . Shortness of breath   . DOE (dyspnea on exertion)     Past Surgical History  Procedure Laterality Date  . No past surgeries    . Cataract extraction w/phaco  02/01/2012    Procedure: CATARACT EXTRACTION PHACO AND INTRAOCULAR LENS PLACEMENT (IOC);  Surgeon: Shade Flood, MD;  Location: [facility] OR;  Service: Ophthalmology;  Laterality: Right;  . Capsulotomy  04/03/2012    Procedure: MINOR CAPSULOTOMY;  Surgeon: Vita Erm., MD;  Location: [facility] OR;  Service: Ophthalmology;  Laterality: Right;  . Yag laser application  04/03/2012    Procedure: YAG LASER APPLICATION;  Surgeon: Vita Erm., MD;  Location: [facility] OR;  Service: Ophthalmology;  Laterality: N/A;    No family history on file. Social History:  reports that he has quit smoking. He does not have any smokeless tobacco history on file. He reports that he does not use illicit drugs. His alcohol history is not on file.  Allergies: No Known Allergies  Medications:                                                                                                                            Current Facility-Administered Medications  Medication Dose Route Frequency Provider Last Rate Last Dose  . acetaminophen (TYLENOL) tablet  650 mg  650 mg Oral Q6H PRN Jerald KiefStephen K Chiu, MD       Or  . acetaminophen (TYLENOL) suppository 650 mg  650 mg Rectal Q6H PRN Jerald KiefStephen K Chiu, MD      . aspirin EC tablet 81 mg  81 mg Oral Once Jerald KiefStephen K Chiu, MD      . atorvastatin (LIPITOR) tablet 10 mg  10 mg Oral Daily Jerald KiefStephen K Chiu, MD      . clopidogrel (PLAVIX) tablet 75 mg  75 mg Oral Daily Jerald KiefStephen K Chiu, MD      . famotidine (PEPCID) tablet 40 mg  40 mg Oral Daily Jerald KiefStephen K Chiu, MD      . finasteride (PROSCAR) tablet 5 mg  5 mg Oral Daily Jerald KiefStephen K Chiu, MD      . heparin injection 5,000 Units  5,000 Units Subcutaneous 3 times per day Jerald KiefStephen K Chiu, MD      . hydrochlorothiazide (HYDRODIURIL) tablet 25 mg  25 mg Oral Daily Jerald KiefStephen K Chiu, MD      . meclizine (ANTIVERT) tablet 25 mg  25 mg Oral TID PRN Jerald KiefStephen K Chiu, MD      . ondansetron [facility](ZOFRAN) tablet 4 mg  4 mg Oral Q6H PRN Jerald KiefStephen K Chiu, MD       Or  . ondansetron [facility](ZOFRAN) injection 4 mg  4 mg Intravenous Q6H PRN Jerald KiefStephen K Chiu, MD      . ramipril (ALTACE) capsule 10 mg  10 mg Oral Daily Jerald KiefStephen K Chiu, MD      . sodium chloride 0.9 % injection 3 mL  3 mL Intravenous Q12H Jerald KiefStephen K Chiu, MD      . TRIPLE OMEGA-3-6-9 CAPS 1 capsule  1 capsule Oral Daily Jerald KiefStephen K Chiu, MD      . Vitamin D CAPS 2,000 Units  2,000 Units Oral Daily Jerald KiefStephen K Chiu, MD       Current Outpatient Prescriptions  Medication Sig Dispense Refill  . aspirin EC 81 MG tablet Take 81 mg by mouth once.      Marland Kitchen. atorvastatin (LIPITOR) 20 MG tablet Take 10 mg by mouth daily.      . Cholecalciferol (VITAMIN D) 2000 UNITS CAPS Take 2,000 Units by mouth daily.       . clopidogrel (PLAVIX) 75 MG tablet Take 75 mg by mouth daily.      . famotidine (PEPCID) 40 MG tablet Take 40 mg  by mouth daily.      . finasteride (PROSCAR) 5 MG tablet Take 5 mg by mouth daily.      . hydrochlorothiazide (HYDRODIURIL) 25 MG tablet Take 25 mg by mouth daily.      . Omega 3-6-9 Fatty Acids (TRIPLE OMEGA-3-6-9) CAPS Take 1 capsule by mouth daily.      . Psyllium (METAMUCIL) 48.57 % POWD Take 1 scoop by mouth daily.       . ramipril (ALTACE) 10 MG capsule Take 10 mg by mouth daily.       . Saw Palmetto 450 MG CAPS Take 1 capsule by mouth daily.      . sildenafil (VIAGRA) 100 MG tablet Take 100 mg by mouth daily as needed for erectile dysfunction.      . vitamin C (ASCORBIC ACID) 500 MG tablet Take 500 mg by mouth daily.         ROS:  History obtained from the patient  General ROS: negative for - chills, fatigue, fever, night sweats, weight gain or weight loss Psychological ROS: negative for - behavioral disorder, hallucinations, memory difficulties, mood swings or suicidal ideation Ophthalmic ROS: negative for - blurry vision, double vision, eye pain or loss of vision ENT ROS: negative for - epistaxis, nasal discharge, oral lesions, sore throat, tinnitus or vertigo Allergy and Immunology ROS: negative for - hives or itchy/watery eyes Hematological and Lymphatic ROS: negative for - bleeding problems, bruising or swollen lymph nodes Endocrine ROS: negative for - galactorrhea, hair pattern changes, polydipsia/polyuria or temperature intolerance Respiratory ROS: negative for - cough, hemoptysis, shortness of breath or wheezing Cardiovascular ROS: negative for - chest pain, dyspnea on exertion, edema or irregular heartbeat Gastrointestinal ROS: negative for - abdominal pain, diarrhea, hematemesis, nausea/vomiting or stool incontinence Genito-Urinary ROS: negative for - dysuria, hematuria, incontinence or urinary frequency/urgency Musculoskeletal ROS: negative  for - joint swelling or muscular weakness Neurological ROS: as noted in HPI Dermatological ROS: negative for rash and skin lesion changes  Neurologic Examination:                                                                                                      Blood pressure 132/62, pulse 62, temperature 97.5 F (36.4 C), temperature source Oral, resp. rate 21, height 5\' 4"  (1.626 m), weight 72.576 kg (160 lb), SpO2 95.00%.  Mental Status: Alert, oriented, thought content appropriate.  Speech fluent without evidence of aphasia.  Able to follow 3 step commands without difficulty. Cranial Nerves: II: Discs flat bilaterally; Visual fields grossly normal, pupils right--6 dilated secondary to surgery  Left 2 mm round and reactive III,IV, VI: ptosis not present, extra-ocular motions intact bilaterally--they do not move conjugate but this may be baseline as patient has no diplopia.  V,VII: smile symmetric, facial light touch sensation normal bilaterally VIII: hearing normal bilaterally IX,X: gag reflex present XI: bilateral shoulder shrug XII: midline tongue extension without atrophy or fasciculations  Motor: Right : Upper extremity   5/5    Left:     Upper extremity   5/5  Lower extremity   5/5     Lower extremity   5/5 Tone and bulk:normal tone throughout; no atrophy noted Sensory: Pinprick and light touch decreased on the right (old) Deep Tendon Reflexes:  Right: Upper Extremity   Left: Upper extremity   biceps (C-5 to C-6) 2/4   biceps (C-5 to C-6) 2/4 tricep (C7) 2/4    triceps (C7) 2/4 Brachioradialis (C6) 2/4  Brachioradialis (C6) 2/4  Lower Extremity Lower Extremity  quadriceps (L-2 to L-4) 2/4   quadriceps (L-2 to L-4) 2/4 Achilles (S1) 2/4   Achilles (S1) 2/4  Plantars: Right: downgoing   Left: downgoing Cerebellar: normal finger-to-nose,  normal heel-to-shin test Gait: normal with no ataxia CV: pulses palpable throughout    Lab Results: Basic Metabolic  Panel:  Recent Labs Lab 07/05/13 0627  NA 140  K 3.8  CL 103  CO2 24  GLUCOSE 122*  BUN 16  CREATININE 0.90  CALCIUM 8.9  Liver Function Tests: No results found for this basename: AST, ALT, ALKPHOS, BILITOT, PROT, ALBUMIN,  in the last 168 hours No results found for this basename: LIPASE, AMYLASE,  in the last 168 hours No results found for this basename: AMMONIA,  in the last 168 hours  CBC:  Recent Labs Lab 07/05/13 0627  WBC 8.0  HGB 13.7  HCT 41.2  MCV 84.1  PLT 134*    Cardiac Enzymes:  Recent Labs Lab 07/05/13 0627  TROPONINI <0.30    Lipid Panel: No results found for this basename: CHOL, TRIG, HDL, CHOLHDL, VLDL, LDLCALC,  in the last 168 hours  CBG: No results found for this basename: GLUCAP,  in the last 168 hours  Microbiology: No results found for this or any previous visit.  Coagulation Studies: No results found for this basename: LABPROT, INR,  in the last 72 hours  Imaging: Ct Head Wo Contrast  07/05/2013   CLINICAL DATA:  Nausea, vomiting and vertigo. Weakness. History of stroke and hypertension.  EXAM: CT HEAD WITHOUT CONTRAST  TECHNIQUE: Contiguous axial images were obtained from the base of the skull through the vertex without intravenous contrast.  COMPARISON:  08/04/2010.  FINDINGS: No mass lesion, mass effect, midline shift, hydrocephalus, hemorrhage. No acute territorial cortical ischemia/infarct. Atrophy and chronic ischemic white matter disease is present. Right lens extraction. Scout images appear within normal limits. Mild ethmoid mucosal thickening.  IMPRESSION: Atrophy and chronic ischemic white matter disease without acute intracranial abnormality.   Electronically Signed   By: Andreas Newport M.D.   On: 07/05/2013 07:08   Mr Terry Gray Head Wo Contrast  07/05/2013   CLINICAL DATA:  80 year old male with vertigo. Nausea and vomiting.  EXAM: MRI HEAD WITHOUT AND WITH CONTRAST  MRA HEAD WITHOUT CONTRAST  MRA NECK WITHOUT AND WITH CONTRAST   TECHNIQUE: Multiplanar, multiecho pulse sequences of the brain and surrounding structures were obtained without and with intravenous contrast. Angiographic images of the Circle of Willis were obtained using MRA technique without intravenous contrast. Angiographic images of the neck were obtained using MRA technique without and with intravenous contrast. Carotid stenosis measurements (when applicable) are obtained utilizing NASCET criteria, using the distal internal carotid diameter as the denominator.  CONTRAST:  15mL MULTIHANCE GADOBENATE DIMEGLUMINE 529 MG/ML IV SOLN  COMPARISON:  Head CT without contrast 0647 hr the same day. Brain MRI, MRA and neck MRA 08/04/2010.  FINDINGS: MRI HEAD FINDINGS  Stable cerebral volume. Major intracranial vascular flow voids are stable.  Small chronic lacunar infarcts in the medial left thalamus, inferior right cerebellum. No restricted diffusion or evidence of acute infarction. Visible internal auditory structures appear normal.  No midline shift, mass effect, evidence of mass lesion, ventriculomegaly, extra-axial collection or acute intracranial hemorrhage. Cervicomedullary junction and pituitary are within normal limits. Negative visualized upper cervical spine except for degenerative ligamentous hypertrophy about the odontoid. Stable scattered and patchy nonspecific cerebral white matter T2 and FLAIR hyperintensity. No cerebral cortical encephalomalacia. No abnormal enhancement identified.  Visualized scalp soft tissues are within normal limits. Mastoids are clear. Minor paranasal sinus mucosal thickening is unchanged. Interval postoperative changes to the right globe. Visualized bone marrow signal is within normal limits.  MRA HEAD FINDINGS  Stable antegrade flow in the posterior circulation with dominant right vertebral artery. Normal PICA origins. Patent vertebrobasilar junction. No basilar stenosis. Normal SCA and left PCA origins. Fetal type right PCA origin. Left  posterior communicating artery diminutive or absent.  Bilateral PCA branches are normal.  Stable antegrade flow  in both ICA siphons. Cavernous segment irregularity compatible with atherosclerosis. Stable probable atherosclerotic pseudo lesion directed laterally just proximal to the anterior genu (series 5, image 79). Ophthalmic and right posterior communicating artery origins are stable and normal.  Patent carotid termini. Stable and normal MCA and ACA origins. Tortuous left A1 segment. Anterior communicating artery and visualized ACA branches are within normal limits. Visualized bilateral MCA branches are stable and within normal limits.  MRA NECK FINDINGS  Precontrast time-of-flight imaging of the neck demonstrates Stable antegrade flow in both carotid and vertebral arteries. Normal appearance of the carotid bifurcations. Antegrade flow signal continues in all 4 vessels to the skullbase.  Post-contrast MRA images reveal a 3 vessel arch configuration with no great vessel origin stenosis.  Proximal great vessels are mildly to moderately tortuous. Otherwise negative common carotid arteries. The right ICA origin and bulb are widely patent. Negative cervical right ICA.  On the left there is a focal filling defect at the posterior lateral left ICA origin and proximal bulb. Subsequent stenosis is up to 50 % with respect to the distal vessel. Beyond this level, the cervical left ICA is normal.  No proximal subclavian artery stenosis. There is mild stenosis at both vertebral artery origins. The right vertebral artery is mildly dominant throughout its course to the basilar. No other vertebral artery stenosis.  IMPRESSION: 1. No acute intracranial abnormality. Stable mild chronic small vessel ischemia since 2012. 2. Chronic left ICA origin atherosclerotic stenosis of 50% (not hemodynamically significant). 3. Mild stenosis at both vertebral artery origins. 4. Stable intracranial MRA, negative except for ICA siphon  atherosclerosis.   Electronically Signed   By: Augusto Gamble M.D.   On: 07/05/2013 09:55   Mr Angiogram Neck W Wo Contrast  07/05/2013   CLINICAL DATA:  80 year old male with vertigo. Nausea and vomiting.  EXAM: MRI HEAD WITHOUT AND WITH CONTRAST  MRA HEAD WITHOUT CONTRAST  MRA NECK WITHOUT AND WITH CONTRAST  TECHNIQUE: Multiplanar, multiecho pulse sequences of the brain and surrounding structures were obtained without and with intravenous contrast. Angiographic images of the Circle of Willis were obtained using MRA technique without intravenous contrast. Angiographic images of the neck were obtained using MRA technique without and with intravenous contrast. Carotid stenosis measurements (when applicable) are obtained utilizing NASCET criteria, using the distal internal carotid diameter as the denominator.  CONTRAST:  15mL MULTIHANCE GADOBENATE DIMEGLUMINE 529 MG/ML IV SOLN  COMPARISON:  Head CT without contrast 0647 hr the same day. Brain MRI, MRA and neck MRA 08/04/2010.  FINDINGS: MRI HEAD FINDINGS  Stable cerebral volume. Major intracranial vascular flow voids are stable.  Small chronic lacunar infarcts in the medial left thalamus, inferior right cerebellum. No restricted diffusion or evidence of acute infarction. Visible internal auditory structures appear normal.  No midline shift, mass effect, evidence of mass lesion, ventriculomegaly, extra-axial collection or acute intracranial hemorrhage. Cervicomedullary junction and pituitary are within normal limits. Negative visualized upper cervical spine except for degenerative ligamentous hypertrophy about the odontoid. Stable scattered and patchy nonspecific cerebral white matter T2 and FLAIR hyperintensity. No cerebral cortical encephalomalacia. No abnormal enhancement identified.  Visualized scalp soft tissues are within normal limits. Mastoids are clear. Minor paranasal sinus mucosal thickening is unchanged. Interval postoperative changes to the right globe.  Visualized bone marrow signal is within normal limits.  MRA HEAD FINDINGS  Stable antegrade flow in the posterior circulation with dominant right vertebral artery. Normal PICA origins. Patent vertebrobasilar junction. No basilar stenosis. Normal SCA and left PCA origins.  Fetal type right PCA origin. Left posterior communicating artery diminutive or absent.  Bilateral PCA branches are normal.  Stable antegrade flow in both ICA siphons. Cavernous segment irregularity compatible with atherosclerosis. Stable probable atherosclerotic pseudo lesion directed laterally just proximal to the anterior genu (series 5, image 79). Ophthalmic and right posterior communicating artery origins are stable and normal.  Patent carotid termini. Stable and normal MCA and ACA origins. Tortuous left A1 segment. Anterior communicating artery and visualized ACA branches are within normal limits. Visualized bilateral MCA branches are stable and within normal limits.  MRA NECK FINDINGS  Precontrast time-of-flight imaging of the neck demonstrates Stable antegrade flow in both carotid and vertebral arteries. Normal appearance of the carotid bifurcations. Antegrade flow signal continues in all 4 vessels to the skullbase.  Post-contrast MRA images reveal a 3 vessel arch configuration with no great vessel origin stenosis.  Proximal great vessels are mildly to moderately tortuous. Otherwise negative common carotid arteries. The right ICA origin and bulb are widely patent. Negative cervical right ICA.  On the left there is a focal filling defect at the posterior lateral left ICA origin and proximal bulb. Subsequent stenosis is up to 50 % with respect to the distal vessel. Beyond this level, the cervical left ICA is normal.  No proximal subclavian artery stenosis. There is mild stenosis at both vertebral artery origins. The right vertebral artery is mildly dominant throughout its course to the basilar. No other vertebral artery stenosis.  IMPRESSION:  1. No acute intracranial abnormality. Stable mild chronic small vessel ischemia since 2012. 2. Chronic left ICA origin atherosclerotic stenosis of 50% (not hemodynamically significant). 3. Mild stenosis at both vertebral artery origins. 4. Stable intracranial MRA, negative except for ICA siphon atherosclerosis.   Electronically Signed   By: Augusto Gamble M.D.   On: 07/05/2013 09:55   Mr Terry Gray ZO Contrast  07/05/2013   CLINICAL DATA:  80 year old male with vertigo. Nausea and vomiting.  EXAM: MRI HEAD WITHOUT AND WITH CONTRAST  MRA HEAD WITHOUT CONTRAST  MRA NECK WITHOUT AND WITH CONTRAST  TECHNIQUE: Multiplanar, multiecho pulse sequences of the brain and surrounding structures were obtained without and with intravenous contrast. Angiographic images of the Circle of Willis were obtained using MRA technique without intravenous contrast. Angiographic images of the neck were obtained using MRA technique without and with intravenous contrast. Carotid stenosis measurements (when applicable) are obtained utilizing NASCET criteria, using the distal internal carotid diameter as the denominator.  CONTRAST:  15mL MULTIHANCE GADOBENATE DIMEGLUMINE 529 MG/ML IV SOLN  COMPARISON:  Head CT without contrast 0647 hr the same day. Brain MRI, MRA and neck MRA 08/04/2010.  FINDINGS: MRI HEAD FINDINGS  Stable cerebral volume. Major intracranial vascular flow voids are stable.  Small chronic lacunar infarcts in the medial left thalamus, inferior right cerebellum. No restricted diffusion or evidence of acute infarction. Visible internal auditory structures appear normal.  No midline shift, mass effect, evidence of mass lesion, ventriculomegaly, extra-axial collection or acute intracranial hemorrhage. Cervicomedullary junction and pituitary are within normal limits. Negative visualized upper cervical spine except for degenerative ligamentous hypertrophy about the odontoid. Stable scattered and patchy nonspecific cerebral white matter T2  and FLAIR hyperintensity. No cerebral cortical encephalomalacia. No abnormal enhancement identified.  Visualized scalp soft tissues are within normal limits. Mastoids are clear. Minor paranasal sinus mucosal thickening is unchanged. Interval postoperative changes to the right globe. Visualized bone marrow signal is within normal limits.  MRA HEAD FINDINGS  Stable antegrade flow in the posterior  circulation with dominant right vertebral artery. Normal PICA origins. Patent vertebrobasilar junction. No basilar stenosis. Normal SCA and left PCA origins. Fetal type right PCA origin. Left posterior communicating artery diminutive or absent.  Bilateral PCA branches are normal.  Stable antegrade flow in both ICA siphons. Cavernous segment irregularity compatible with atherosclerosis. Stable probable atherosclerotic pseudo lesion directed laterally just proximal to the anterior genu (series 5, image 79). Ophthalmic and right posterior communicating artery origins are stable and normal.  Patent carotid termini. Stable and normal MCA and ACA origins. Tortuous left A1 segment. Anterior communicating artery and visualized ACA branches are within normal limits. Visualized bilateral MCA branches are stable and within normal limits.  MRA NECK FINDINGS  Precontrast time-of-flight imaging of the neck demonstrates Stable antegrade flow in both carotid and vertebral arteries. Normal appearance of the carotid bifurcations. Antegrade flow signal continues in all 4 vessels to the skullbase.  Post-contrast MRA images reveal a 3 vessel arch configuration with no great vessel origin stenosis.  Proximal great vessels are mildly to moderately tortuous. Otherwise negative common carotid arteries. The right ICA origin and bulb are widely patent. Negative cervical right ICA.  On the left there is a focal filling defect at the posterior lateral left ICA origin and proximal bulb. Subsequent stenosis is up to 50 % with respect to the distal vessel.  Beyond this level, the cervical left ICA is normal.  No proximal subclavian artery stenosis. There is mild stenosis at both vertebral artery origins. The right vertebral artery is mildly dominant throughout its course to the basilar. No other vertebral artery stenosis.  IMPRESSION: 1. No acute intracranial abnormality. Stable mild chronic small vessel ischemia since 2012. 2. Chronic left ICA origin atherosclerotic stenosis of 50% (not hemodynamically significant). 3. Mild stenosis at both vertebral artery origins. 4. Stable intracranial MRA, negative except for ICA siphon atherosclerosis.   Electronically Signed   By: Augusto Gamble M.D.   On: 07/05/2013 09:55   Dg Chest Portable 1 View  07/05/2013   CLINICAL DATA:  Shortness of breath, chest pain, nausea and weakness. Vomiting.  EXAM: PORTABLE CHEST - 1 VIEW  COMPARISON:  Chest radiograph performed 01/23/2012  FINDINGS: The lungs are well-aerated and clear. There is no evidence of focal opacification, pleural effusion or pneumothorax.  The cardiomediastinal silhouette is within normal limits. No acute osseous abnormalities are seen.  IMPRESSION: No acute cardiopulmonary process seen.   Electronically Signed   By: Roanna Raider M.D.   On: 07/05/2013 06:47       Assessment and plan discussed with with attending physician and they are in agreement.    Felicie Morn PA-C [facility] [phone]  07/05/2013, 11:55 AM   Assessment: 80 y.o. male with acute onset of vertigo, diaphoresis, N/V and word finding difficulty.  Symptoms have fully resolved at this time.  Given history of previous CVA and possible run of Afib while in ED, cannot exclude posterior circulation TIA. MRI brain has been done and showed no acute infarct and stable intracranial vasculature. MRA neck showed no significant stenosis.   Stroke Risk Factors - hypertension  Recommend: 1. HgbA1c, fasting lipid panel 2. PT consult, OT consult, Speech consult 3. Echocardiogram 4.  Prophylactic therapy-Antiplatelet med: Aspirin - dose 81mg  and Antiplatelet med: Plavix - dose 75 mg daily 5. Risk  Factor modification  I personally participated in this patient's evaluation and management, including formulating the above clinical assessment and management recommendations.   Venetia Maxon M.D. [facility] [phone]

## 2013-07-06 DIAGNOSIS — E785 Hyperlipidemia, unspecified: Secondary | ICD-10-CM

## 2013-07-06 DIAGNOSIS — G459 Transient cerebral ischemic attack, unspecified: Secondary | ICD-10-CM

## 2013-07-06 DIAGNOSIS — I517 Cardiomegaly: Secondary | ICD-10-CM

## 2013-07-06 LAB — CBC
HEMATOCRIT: 40.6 % (ref 39.0–52.0)
Hemoglobin: 13.2 g/dL (ref 13.0–17.0)
MCH: 27.6 pg (ref 26.0–34.0)
MCHC: 32.5 g/dL (ref 30.0–36.0)
MCV: 84.9 fL (ref 78.0–100.0)
PLATELETS: 148 10*3/uL — AB (ref 150–400)
RBC: 4.78 MIL/uL (ref 4.22–5.81)
RDW: 13.4 % (ref 11.5–15.5)
WBC: 7.9 10*3/uL (ref 4.0–10.5)

## 2013-07-06 LAB — COMPREHENSIVE METABOLIC PANEL
ALBUMIN: 3 g/dL — AB (ref 3.5–5.2)
ALK PHOS: 42 U/L (ref 39–117)
ALT: 18 U/L (ref 0–53)
AST: 17 U/L (ref 0–37)
BUN: 19 mg/dL (ref 6–23)
CO2: 27 mEq/L (ref 19–32)
Calcium: 9 mg/dL (ref 8.4–10.5)
Chloride: 103 mEq/L (ref 96–112)
Creatinine, Ser: 1.15 mg/dL (ref 0.50–1.35)
GFR calc Af Amer: 69 mL/min — ABNORMAL LOW (ref >90)
GFR calc non Af Amer: 60 mL/min — ABNORMAL LOW (ref >90)
Glucose, Bld: 94 mg/dL (ref 70–99)
POTASSIUM: 3.8 meq/L (ref 3.7–5.3)
Sodium: 140 mEq/L (ref 137–147)
TOTAL PROTEIN: 6.1 g/dL (ref 6.0–8.3)
Total Bilirubin: 0.4 mg/dL (ref 0.3–1.2)

## 2013-07-06 LAB — TROPONIN I: Troponin I: <0.3 ng/mL (ref <0.30)

## 2013-07-06 MED ORDER — MECLIZINE HCL 25 MG PO TABS: 25.0000 mg | ORAL_TABLET | Freq: Three times a day (TID) | ORAL | Status: DC | PRN

## 2013-07-06 MED ORDER — RIVAROXABAN 20 MG PO TABS: 20.0000 mg | ORAL_TABLET | Freq: Every day | ORAL | Status: DC

## 2013-07-06 NOTE — Evaluation (Signed)
Physical Therapy Evaluation Patient Details Name: Terry Gray MRN: [id number] DOB: 21-Mar-1933 Today's Date: 07/06/2013   History of Present Illness  Pt admit with vertigo and TIA.  Full resolution of symptoms.   Clinical Impression  Pt admitted with above. Pt currently with functional limitations due to the deficits listed below (see PT Problem List).  Pt will benefit from skilled PT to increase their independence and safety with mobility to allow discharge to the venue listed below.     Follow Up Recommendations Home health PT for balance training;Supervision/Assistance - 24 hour    Equipment Recommendations  None recommended by PT    Recommendations for Other Services       Precautions / Restrictions Precautions Precautions: Fall Restrictions Weight Bearing Restrictions: No      Mobility  Bed Mobility Overal bed mobility: Independent                Transfers Overall transfer level: Independent                  Ambulation/Gait Ambulation/Gait assistance: Min guard;Min assist Ambulation Distance (Feet): 300 Feet Assistive device: None;Straight cane Gait Pattern/deviations: Step-through pattern;Ataxic   Gait velocity interpretation: at or above normal speed for age/gender General Gait Details: Pt with some uncoordination noted right occasional tendency for pt to drift to right with ambulation.  Pt reports right LE feels heavier even though strength tests pretty well. No significant LOB.  Pt ambulating more steady with cane. Pt has a cane at home.     Stairs            Wheelchair Mobility    Modified Rankin (Stroke Patients Only) Modified Rankin (Stroke Patients Only) Pre-Morbid Rankin Score: Slight disability Modified Rankin: Moderate disability     Balance Overall balance assessment: Needs assistance;History of Falls         Standing balance support: Single extremity supported;During functional activity Standing balance-Leahy Scale:  Fair Standing balance comment: Pt able to stabilize self in static stance without UE support or physical support of therapist.  Pt cannot tolerate more than min challenges to balance without cane and can tolerate moderate challenges to balance with cane.               High level balance activites: Direction changes;Turns;Sudden stops High Level Balance Comments: Can do above with cane with min guard asssit only.               Pertinent Vitals/Pain VSS, no pain    Home Living Family/patient expects to be discharged to:: Private residence Living Arrangements: Spouse/significant other;Children;Other relatives Available Help at Discharge: Family;Available 24 hours/day Type of Home: House Home Access: Stairs to enter Entrance Stairs-Rails: Right;Left;Can reach both Entrance Stairs-Number of Steps: 5 Home Layout: Two level;Able to live on main level with bedroom/bathroom Home Equipment: Gilmer Morane - single point      Prior Function Level of Independence: Independent               Hand Dominance        Extremity/Trunk Assessment   Upper Extremity Assessment: Defer to OT evaluation           Lower Extremity Assessment: RLE deficits/detail;LLE deficits/detail RLE Deficits / Details: grossly 4/5 LLE Deficits / Details: grossly 5/5      Communication   Communication: No difficulties  Cognition Arousal/Alertness: Awake/alert Behavior During Therapy: WFL for tasks assessed/performed Overall Cognitive Status: Within Functional Limits for tasks assessed  General Comments General comments (skin integrity, edema, etc.): Discussed TIA and symptoms and risk factors for stroke and gave pt handouts.      Exercises        Assessment/Plan    PT Assessment Patient needs continued PT services  PT Diagnosis Generalized weakness   PT Problem List Decreased activity tolerance;Decreased balance;Decreased mobility;Decreased knowledge of use of  DME;Decreased safety awareness;Decreased knowledge of precautions  PT Treatment Interventions DME instruction;Gait training;Therapeutic activities;Functional mobility training;Therapeutic exercise;Balance training;Patient/family education   PT Goals (Current goals can be found in the Care Plan section) Acute Rehab PT Goals Patient Stated Goal: to go home PT Goal Formulation: With patient Time For Goal Achievement: 07/13/13 Potential to Achieve Goals: Good    Frequency Min 3X/week   Barriers to discharge        Co-evaluation               End of Session Equipment Utilized During Treatment: Gait belt Activity Tolerance: Patient limited by fatigue Patient left: in bed;with call bell/phone within reach;with family/visitor present Nurse Communication: Mobility status    Functional Assessment Tool Used: clinical judgment Functional Limitation: Mobility: Walking and moving around Mobility: Walking and Moving Around Current Status [phone]([id number]): At least 1 percent but less than 20 percent impaired, limited or restricted Mobility: Walking and Moving Around Goal Status [phone](G8979): 0 percent impaired, limited or restricted    Time: [id number] PT Time Calculation (min): 26 min   Charges:   PT Evaluation $Initial PT Evaluation Tier I: 1 Procedure PT Treatments $Gait Training: 8-22 mins   PT G Codes:   Functional Assessment Tool Used: clinical judgment Functional Limitation: Mobility: Walking and moving around    [facility] For RehabilitationDawn Ingold 07/06/2013, 2:03 PM Colgate PalmoliveDawn Ingold,PT Acute Rehabilitation [phone] (pager)

## 2013-07-06 NOTE — Progress Notes (Signed)
Utilization review complete 

## 2013-07-06 NOTE — Progress Notes (Signed)
Stroke Team Progress Note  HISTORY  Terry Gray is an 80 y.o. male with history of CVA leaving him with right arm and leg decreased sensation. Patient was last normal prior to going to bed last night. He woke up this morning feeling vertigo moving from right to left, nausea, vomiting, diaphoresis and per daughter some word finding difficulty. His family was concerned this may be another CVA and brought him to the ED. Currently his symptoms of vertigo have resolved, speech is clear and his main complaint is a HA.  Patient had questionable run of Afib while in ED.   Patient was not administered TPA secondary to resolution of symptoms. He was admitted to [facility] for further evaluation and treatment.  SUBJECTIVE He is currently resting comfortably. No overnight events. He describes a vertigo sensation which was worse with head movement and improved with keeping his head still. Symptoms have fully resolved and he is at his baseline.   OBJECTIVE Most recent Vital Signs: Filed Vitals:   07/05/13 1222 07/05/13 1650 07/05/13 2222 07/06/13 0543  BP: 132/68 110/61 96/52 108/59  Pulse: 69 58 52 64  Temp: 97.8 F (36.6 C)  98.4 F (36.9 C) 98.5 F (36.9 C)  TempSrc: Oral  Oral Oral  Resp: 16  18 16   Height:      Weight:    158 lb (71.668 kg)  SpO2: 95%  95% 98%   CBG (last 3)  No results found for this basename: GLUCAP,  in the last 72 hours  IV Fluid Intake:     MEDICATIONS  . atorvastatin  10 mg Oral q1800  . cholecalciferol  2,000 Units Oral Daily  . clopidogrel  75 mg Oral Daily  . famotidine  40 mg Oral Daily  . finasteride  5 mg Oral Daily  . heparin  5,000 Units Subcutaneous 3 times per day  . hydrochlorothiazide  25 mg Oral Daily  . omega-3 acid ethyl esters  1 g Oral Daily  . ramipril  10 mg Oral Daily  . sodium chloride  3 mL Intravenous Q12H   PRN:  acetaminophen, acetaminophen, meclizine, ondansetron (ZOFRAN) IV, ondansetron  Diet:  Cardiac  Activity:  Up with assistance DVT  Prophylaxis:  heparin  CLINICALLY SIGNIFICANT STUDIES Basic Metabolic Panel:  Recent Labs Lab 07/05/13 0627 07/05/13 1150 07/06/13 0317  NA 140  --  140  K 3.8  --  3.8  CL 103  --  103  CO2 24  --  27  GLUCOSE 122*  --  94  BUN 16  --  19  CREATININE 0.90 0.84 1.15  CALCIUM 8.9  --  9.0   Liver Function Tests:  Recent Labs Lab 07/06/13 0317  AST 17  ALT 18  ALKPHOS 42  BILITOT 0.4  PROT 6.1  ALBUMIN 3.0*   CBC:  Recent Labs Lab 07/05/13 1150 07/06/13 0317  WBC 7.7 7.9  HGB 13.9 13.2  HCT 41.2 40.6  MCV 84.3 84.9  PLT 149* 148*   Coagulation: No results found for this basename: LABPROT, INR,  in the last 168 hours Cardiac Enzymes:  Recent Labs Lab 07/05/13 1530 07/05/13 2202 07/06/13 0317  TROPONINI <0.30 <0.30 <0.30   Urinalysis:  Recent Labs Lab 07/05/13 0935  COLORURINE YELLOW  LABSPEC 1.024  PHURINE 7.5  GLUCOSEU NEGATIVE  HGBUR NEGATIVE  BILIRUBINUR NEGATIVE  KETONESUR NEGATIVE  PROTEINUR NEGATIVE  UROBILINOGEN 0.2  NITRITE NEGATIVE  LEUKOCYTESUR NEGATIVE   Lipid Panel    Component Value Date/Time  CHOL 153 08/04/2010 0515   TRIG 85 08/04/2010 0515   HDL 45 08/04/2010 0515   CHOLHDL 3.4 08/04/2010 0515   VLDL 17 08/04/2010 0515   LDLCALC  Value: 91        Total Cholesterol/HDL:CHD Risk Coronary Heart Disease Risk Table                     Men   Women  1/2 Average Risk   3.4   3.3  Average Risk       5.0   4.4  2 X Average Risk   9.6   7.1  3 X Average Risk  23.4   11.0        Use the calculated Patient Ratio above and the CHD Risk Table to determine the patient's CHD Risk.        ATP III CLASSIFICATION (LDL):  <100     mg/dL   Optimal  811-914  mg/dL   Near or Above                    Optimal  130-159  mg/dL   Borderline  782-956  mg/dL   High  >213     mg/dL   Very High 0/86/5784 6962   HgbA1C  Lab Results  Component Value Date   HGBA1C  Value: 5.6 (NOTE)                                                                       According to  the ADA Clinical Practice Recommendations for 2011, when HbA1c is used as a screening test:   >=6.5%   Diagnostic of Diabetes Mellitus           (if abnormal result  is confirmed)  5.7-6.4%   Increased risk of developing Diabetes Mellitus  References:Diagnosis and Classification of Diabetes Mellitus,Diabetes Care,2011,34(Suppl 1):S62-S69 and Standards of Medical Care in         Diabetes - 2011,Diabetes Care,2011,34  (Suppl 1):S11-S61. 08/04/2010    Urine Drug Screen:   No results found for this basename: labopia, cocainscrnur, labbenz, amphetmu, thcu, labbarb    Alcohol Level: No results found for this basename: ETH,  in the last 168 hours  Ct Head Wo Contrast  07/05/2013   CLINICAL DATA:  Nausea, vomiting and vertigo. Weakness. History of stroke and hypertension.  EXAM: CT HEAD WITHOUT CONTRAST  TECHNIQUE: Contiguous axial images were obtained from the base of the skull through the vertex without intravenous contrast.  COMPARISON:  08/04/2010.  FINDINGS: No mass lesion, mass effect, midline shift, hydrocephalus, hemorrhage. No acute territorial cortical ischemia/infarct. Atrophy and chronic ischemic white matter disease is present. Right lens extraction. Scout images appear within normal limits. Mild ethmoid mucosal thickening.  IMPRESSION: Atrophy and chronic ischemic white matter disease without acute intracranial abnormality.   Electronically Signed   By: Andreas Newport M.D.   On: 07/05/2013 07:08   Mr Maxine Glenn Head Wo Contrast  07/05/2013   CLINICAL DATA:  80 year old male with vertigo. Nausea and vomiting.  EXAM: MRI HEAD WITHOUT AND WITH CONTRAST  MRA HEAD WITHOUT CONTRAST  MRA NECK WITHOUT AND WITH CONTRAST  TECHNIQUE: Multiplanar, multiecho pulse sequences of the brain and surrounding structures were obtained  without and with intravenous contrast. Angiographic images of the Circle of Willis were obtained using MRA technique without intravenous contrast. Angiographic images of the neck were obtained  using MRA technique without and with intravenous contrast. Carotid stenosis measurements (when applicable) are obtained utilizing NASCET criteria, using the distal internal carotid diameter as the denominator.  CONTRAST:  15mL MULTIHANCE GADOBENATE DIMEGLUMINE 529 MG/ML IV SOLN  COMPARISON:  Head CT without contrast 0647 hr the same day. Brain MRI, MRA and neck MRA 08/04/2010.  FINDINGS: MRI HEAD FINDINGS  Stable cerebral volume. Major intracranial vascular flow voids are stable.  Small chronic lacunar infarcts in the medial left thalamus, inferior right cerebellum. No restricted diffusion or evidence of acute infarction. Visible internal auditory structures appear normal.  No midline shift, mass effect, evidence of mass lesion, ventriculomegaly, extra-axial collection or acute intracranial hemorrhage. Cervicomedullary junction and pituitary are within normal limits. Negative visualized upper cervical spine except for degenerative ligamentous hypertrophy about the odontoid. Stable scattered and patchy nonspecific cerebral white matter T2 and FLAIR hyperintensity. No cerebral cortical encephalomalacia. No abnormal enhancement identified.  Visualized scalp soft tissues are within normal limits. Mastoids are clear. Minor paranasal sinus mucosal thickening is unchanged. Interval postoperative changes to the right globe. Visualized bone marrow signal is within normal limits.  MRA HEAD FINDINGS  Stable antegrade flow in the posterior circulation with dominant right vertebral artery. Normal PICA origins. Patent vertebrobasilar junction. No basilar stenosis. Normal SCA and left PCA origins. Fetal type right PCA origin. Left posterior communicating artery diminutive or absent.  Bilateral PCA branches are normal.  Stable antegrade flow in both ICA siphons. Cavernous segment irregularity compatible with atherosclerosis. Stable probable atherosclerotic pseudo lesion directed laterally just proximal to the anterior genu (series  5, image 79). Ophthalmic and right posterior communicating artery origins are stable and normal.  Patent carotid termini. Stable and normal MCA and ACA origins. Tortuous left A1 segment. Anterior communicating artery and visualized ACA branches are within normal limits. Visualized bilateral MCA branches are stable and within normal limits.  MRA NECK FINDINGS  Precontrast time-of-flight imaging of the neck demonstrates Stable antegrade flow in both carotid and vertebral arteries. Normal appearance of the carotid bifurcations. Antegrade flow signal continues in all 4 vessels to the skullbase.  Post-contrast MRA images reveal a 3 vessel arch configuration with no great vessel origin stenosis.  Proximal great vessels are mildly to moderately tortuous. Otherwise negative common carotid arteries. The right ICA origin and bulb are widely patent. Negative cervical right ICA.  On the left there is a focal filling defect at the posterior lateral left ICA origin and proximal bulb. Subsequent stenosis is up to 50 % with respect to the distal vessel. Beyond this level, the cervical left ICA is normal.  No proximal subclavian artery stenosis. There is mild stenosis at both vertebral artery origins. The right vertebral artery is mildly dominant throughout its course to the basilar. No other vertebral artery stenosis.  IMPRESSION: 1. No acute intracranial abnormality. Stable mild chronic small vessel ischemia since 2012. 2. Chronic left ICA origin atherosclerotic stenosis of 50% (not hemodynamically significant). 3. Mild stenosis at both vertebral artery origins. 4. Stable intracranial MRA, negative except for ICA siphon atherosclerosis.   Electronically Signed   By: Augusto Gamble M.D.   On: 07/05/2013 09:55   Mr Angiogram Neck W Wo Contrast  07/05/2013   CLINICAL DATA:  81 year old male with vertigo. Nausea and vomiting.  EXAM: MRI HEAD WITHOUT AND WITH CONTRAST  MRA HEAD WITHOUT  CONTRAST  MRA NECK WITHOUT AND WITH CONTRAST   TECHNIQUE: Multiplanar, multiecho pulse sequences of the brain and surrounding structures were obtained without and with intravenous contrast. Angiographic images of the Circle of Willis were obtained using MRA technique without intravenous contrast. Angiographic images of the neck were obtained using MRA technique without and with intravenous contrast. Carotid stenosis measurements (when applicable) are obtained utilizing NASCET criteria, using the distal internal carotid diameter as the denominator.  CONTRAST:  15mL MULTIHANCE GADOBENATE DIMEGLUMINE 529 MG/ML IV SOLN  COMPARISON:  Head CT without contrast 0647 hr the same day. Brain MRI, MRA and neck MRA 08/04/2010.  FINDINGS: MRI HEAD FINDINGS  Stable cerebral volume. Major intracranial vascular flow voids are stable.  Small chronic lacunar infarcts in the medial left thalamus, inferior right cerebellum. No restricted diffusion or evidence of acute infarction. Visible internal auditory structures appear normal.  No midline shift, mass effect, evidence of mass lesion, ventriculomegaly, extra-axial collection or acute intracranial hemorrhage. Cervicomedullary junction and pituitary are within normal limits. Negative visualized upper cervical spine except for degenerative ligamentous hypertrophy about the odontoid. Stable scattered and patchy nonspecific cerebral white matter T2 and FLAIR hyperintensity. No cerebral cortical encephalomalacia. No abnormal enhancement identified.  Visualized scalp soft tissues are within normal limits. Mastoids are clear. Minor paranasal sinus mucosal thickening is unchanged. Interval postoperative changes to the right globe. Visualized bone marrow signal is within normal limits.  MRA HEAD FINDINGS  Stable antegrade flow in the posterior circulation with dominant right vertebral artery. Normal PICA origins. Patent vertebrobasilar junction. No basilar stenosis. Normal SCA and left PCA origins. Fetal type right PCA origin. Left  posterior communicating artery diminutive or absent.  Bilateral PCA branches are normal.  Stable antegrade flow in both ICA siphons. Cavernous segment irregularity compatible with atherosclerosis. Stable probable atherosclerotic pseudo lesion directed laterally just proximal to the anterior genu (series 5, image 79). Ophthalmic and right posterior communicating artery origins are stable and normal.  Patent carotid termini. Stable and normal MCA and ACA origins. Tortuous left A1 segment. Anterior communicating artery and visualized ACA branches are within normal limits. Visualized bilateral MCA branches are stable and within normal limits.  MRA NECK FINDINGS  Precontrast time-of-flight imaging of the neck demonstrates Stable antegrade flow in both carotid and vertebral arteries. Normal appearance of the carotid bifurcations. Antegrade flow signal continues in all 4 vessels to the skullbase.  Post-contrast MRA images reveal a 3 vessel arch configuration with no great vessel origin stenosis.  Proximal great vessels are mildly to moderately tortuous. Otherwise negative common carotid arteries. The right ICA origin and bulb are widely patent. Negative cervical right ICA.  On the left there is a focal filling defect at the posterior lateral left ICA origin and proximal bulb. Subsequent stenosis is up to 50 % with respect to the distal vessel. Beyond this level, the cervical left ICA is normal.  No proximal subclavian artery stenosis. There is mild stenosis at both vertebral artery origins. The right vertebral artery is mildly dominant throughout its course to the basilar. No other vertebral artery stenosis.  IMPRESSION: 1. No acute intracranial abnormality. Stable mild chronic small vessel ischemia since 2012. 2. Chronic left ICA origin atherosclerotic stenosis of 50% (not hemodynamically significant). 3. Mild stenosis at both vertebral artery origins. 4. Stable intracranial MRA, negative except for ICA siphon  atherosclerosis.   Electronically Signed   By: Augusto GambleLee  Hall M.D.   On: 07/05/2013 09:55   Mr Laqueta JeanBrain W ZOWo Contrast  07/05/2013  CLINICAL DATA:  80 year old male with vertigo. Nausea and vomiting.  EXAM: MRI HEAD WITHOUT AND WITH CONTRAST  MRA HEAD WITHOUT CONTRAST  MRA NECK WITHOUT AND WITH CONTRAST  TECHNIQUE: Multiplanar, multiecho pulse sequences of the brain and surrounding structures were obtained without and with intravenous contrast. Angiographic images of the Circle of Willis were obtained using MRA technique without intravenous contrast. Angiographic images of the neck were obtained using MRA technique without and with intravenous contrast. Carotid stenosis measurements (when applicable) are obtained utilizing NASCET criteria, using the distal internal carotid diameter as the denominator.  CONTRAST:  15mL MULTIHANCE GADOBENATE DIMEGLUMINE 529 MG/ML IV SOLN  COMPARISON:  Head CT without contrast 0647 hr the same day. Brain MRI, MRA and neck MRA 08/04/2010.  FINDINGS: MRI HEAD FINDINGS  Stable cerebral volume. Major intracranial vascular flow voids are stable.  Small chronic lacunar infarcts in the medial left thalamus, inferior right cerebellum. No restricted diffusion or evidence of acute infarction. Visible internal auditory structures appear normal.  No midline shift, mass effect, evidence of mass lesion, ventriculomegaly, extra-axial collection or acute intracranial hemorrhage. Cervicomedullary junction and pituitary are within normal limits. Negative visualized upper cervical spine except for degenerative ligamentous hypertrophy about the odontoid. Stable scattered and patchy nonspecific cerebral white matter T2 and FLAIR hyperintensity. No cerebral cortical encephalomalacia. No abnormal enhancement identified.  Visualized scalp soft tissues are within normal limits. Mastoids are clear. Minor paranasal sinus mucosal thickening is unchanged. Interval postoperative changes to the right globe. Visualized  bone marrow signal is within normal limits.  MRA HEAD FINDINGS  Stable antegrade flow in the posterior circulation with dominant right vertebral artery. Normal PICA origins. Patent vertebrobasilar junction. No basilar stenosis. Normal SCA and left PCA origins. Fetal type right PCA origin. Left posterior communicating artery diminutive or absent.  Bilateral PCA branches are normal.  Stable antegrade flow in both ICA siphons. Cavernous segment irregularity compatible with atherosclerosis. Stable probable atherosclerotic pseudo lesion directed laterally just proximal to the anterior genu (series 5, image 79). Ophthalmic and right posterior communicating artery origins are stable and normal.  Patent carotid termini. Stable and normal MCA and ACA origins. Tortuous left A1 segment. Anterior communicating artery and visualized ACA branches are within normal limits. Visualized bilateral MCA branches are stable and within normal limits.  MRA NECK FINDINGS  Precontrast time-of-flight imaging of the neck demonstrates Stable antegrade flow in both carotid and vertebral arteries. Normal appearance of the carotid bifurcations. Antegrade flow signal continues in all 4 vessels to the skullbase.  Post-contrast MRA images reveal a 3 vessel arch configuration with no great vessel origin stenosis.  Proximal great vessels are mildly to moderately tortuous. Otherwise negative common carotid arteries. The right ICA origin and bulb are widely patent. Negative cervical right ICA.  On the left there is a focal filling defect at the posterior lateral left ICA origin and proximal bulb. Subsequent stenosis is up to 50 % with respect to the distal vessel. Beyond this level, the cervical left ICA is normal.  No proximal subclavian artery stenosis. There is mild stenosis at both vertebral artery origins. The right vertebral artery is mildly dominant throughout its course to the basilar. No other vertebral artery stenosis.  IMPRESSION: 1. No acute  intracranial abnormality. Stable mild chronic small vessel ischemia since 2012. 2. Chronic left ICA origin atherosclerotic stenosis of 50% (not hemodynamically significant). 3. Mild stenosis at both vertebral artery origins. 4. Stable intracranial MRA, negative except for ICA siphon atherosclerosis.   Electronically Signed  By: Augusto Gamble M.D.   On: 07/05/2013 09:55   Dg Chest Portable 1 View  07/05/2013   CLINICAL DATA:  Shortness of breath, chest pain, nausea and weakness. Vomiting.  EXAM: PORTABLE CHEST - 1 VIEW  COMPARISON:  Chest radiograph performed 01/23/2012  FINDINGS: The lungs are well-aerated and clear. There is no evidence of focal opacification, pleural effusion or pneumothorax.  The cardiomediastinal silhouette is within normal limits. No acute osseous abnormalities are seen.  IMPRESSION: No acute cardiopulmonary process seen.   Electronically Signed   By: Roanna Raider M.D.   On: 07/05/2013 06:47    CT of the brain    2D Echocardiogram    Carotid Doppler    CXR    EKG   Therapy Recommendations   Physical Exam   Mental Status:  Alert, oriented, thought content appropriate. Speech fluent without evidence of aphasia. Able to follow 3 step commands without difficulty.  Cranial Nerves:  II: Discs flat bilaterally; Visual fields grossly normal, pupils right--6 dilated secondary to surgery Left 2 mm round and reactive  III,IV, VI: ptosis not present, extra-ocular motions intact bilaterally--they do not move conjugate but this may be baseline as patient has no diplopia.  V,VII: smile symmetric, facial light touch sensation normal bilaterally  VIII: hearing normal bilaterally  IX,X: gag reflex present  XI: bilateral shoulder shrug  XII: midline tongue extension without atrophy or fasciculations  Motor:  Right : Upper extremity 5/5 Left: Upper extremity 5/5  Lower extremity 5/5 Lower extremity 5/5  Tone and bulk:normal tone throughout; no atrophy noted  Sensory: Pinprick and  light touch decreased on the right (old)  Deep Tendon Reflexes:  Right: Upper Extremity Left: Upper extremity  biceps (C-5 to C-6) 2/4 biceps (C-5 to C-6) 2/4  tricep (C7) 2/4 triceps (C7) 2/4  Brachioradialis (C6) 2/4 Brachioradialis (C6) 2/4  Lower Extremity Lower Extremity  quadriceps (L-2 to L-4) 2/4 quadriceps (L-2 to L-4) 2/4  Achilles (S1) 2/4 Achilles (S1) 2/4  Plantars:  Right: downgoing Left: downgoing  Cerebellar:  normal finger-to-nose, normal heel-to-shin test  Gait: normal with no ataxia   ASSESSMENT Mr. Hussain Maimone is a 80 y.o. male presenting with vertigo, nausea with subsequent resolution of symptoms. MRI/A brain and MRA neck overall unremarkable with no acute infarct.  No TPA given due to resolution of symptoms. Based on clinical history would question if vertigo secondary to a peripheral source.  Atrial fibrilation  LDL   HTN  Prior CVA   Hospital day # 1  TREATMENT/PLAN  With history of A fib and CHADS-Vasc score of 4 would likely benefit from full dose anticoagulation in this patient. Consider NOAC. Continue on ASA and Plavix for now pending decision  2D echo  Rehab consult. May benefit from vestibular rehab as an outpatient    Elspeth Cho, DO Neurology-Stroke  To contact Stroke Continuity provider, please refer to [email or web address]. After hours, contact General Neurology

## 2013-07-06 NOTE — Discharge Summary (Addendum)
Physician Discharge Summary  Terry Gray ZOX:[id number] DOB: [birth year] DOA: 07/05/2013  PCP: Georgann HousekeeperHUSAIN,KARRAR, MD  Admit date: 07/05/2013 Discharge date: 07/07/2013  Time spent: 35 minutes  Recommendations for Outpatient Follow-up:  1. Follow up with PCP in 1-2 weeks 2. May consider outpt Holter monitor testing  Discharge Diagnoses:  Principal Problem:   Vertigo Active Problems:   Chest pain   Dyspnea   Essential hypertension   Elevated lipids   TIA (transient ischemic attack)   Discharge Condition: Stable  Diet recommendation: Heart healthy  Filed Weights   07/05/13 0601 07/06/13 0543  Weight: 72.576 kg (160 lb) 71.668 kg (158 lb)    History of present illness:  Terry Bowlierre Dick is a 80 y.o. male  With a hx of htn who presents with acute vertigo symptoms associated with n/v upon awakening on the AM of admission. Pt was transported to ED via EMS where pt was noted to have new onset afib. Vertiginous symptoms reportedly improved with holding head still, worse with movement. Given concerns for possible CVA/TIA, Neurology was consulted. Hospitalist was consulted for consideration for admission.  Hospital Course:  1. Vertigo  1. Presenting sx noted to be improved with holding head still and antivert and worse with head movement - suspected peripheral source for vertigo 2. MRI ordered, unremarkable for acute pathology 3. Neurology consulted - discussed with Neurologist on call. Recs for transition of asa and plavix to xarelto 4. PT/OT consulted - no needs 5. Cont antivert for symptoms 2. Afib  1. Noted briefly while in the ED 2. Presently regular rhythm 3. Will check 2d echo 4. Serial cardiac enzymes negative 5. Already on asa and plavix 6. Per family, pt has hx of "light stroke" 7. Per neurology, recs for xarelto on discharge 3. HTN  1. BP stable 2. Cont home meds 4. DVT prophylaxis  1. Heparin subq while inpt  Consultations:  Neurology  Discharge Exam: Filed Vitals:    07/05/13 1650 07/05/13 2222 07/06/13 0543 07/06/13 1428  BP: 110/61 96/52 108/59 99/53  Pulse: 58 52 64 56  Temp:  98.4 F (36.9 C) 98.5 F (36.9 C) 98.2 F (36.8 C)  TempSrc:  Oral Oral Oral  Resp:  18 16 17   Height:      Weight:   71.668 kg (158 lb)   SpO2:  95% 98% 95%    General: awake, in nad Cardiovascular: regular, s1, s2 Respiratory: normal resp effort, no wheezing  Discharge Instructions     Medication List    STOP taking these medications       aspirin EC 81 MG tablet     clopidogrel 75 MG tablet  Commonly known as:  PLAVIX      TAKE these medications       atorvastatin 20 MG tablet  Commonly known as:  LIPITOR  Take 10 mg by mouth daily.     famotidine 40 MG tablet  Commonly known as:  PEPCID  Take 40 mg by mouth daily.     finasteride 5 MG tablet  Commonly known as:  PROSCAR  Take 5 mg by mouth daily.     hydrochlorothiazide 25 MG tablet  Commonly known as:  HYDRODIURIL  Take 25 mg by mouth daily.     meclizine 25 MG tablet  Commonly known as:  ANTIVERT  Take 1 tablet (25 mg total) by mouth 3 (three) times daily as needed for dizziness or nausea.     METAMUCIL 48.57 % Powd  Generic drug:  Psyllium  Take 1 scoop by mouth daily.     ramipril 10 MG capsule  Commonly known as:  ALTACE  Take 10 mg by mouth daily.     rivaroxaban 20 MG Tabs tablet  Commonly known as:  XARELTO  Take 1 tablet (20 mg total) by mouth daily with supper.     Saw Palmetto 450 MG Caps  Take 1 capsule by mouth daily.     sildenafil 100 MG tablet  Commonly known as:  VIAGRA  Take 100 mg by mouth daily as needed for erectile dysfunction.     TRIPLE OMEGA-3-6-9 Caps  Take 1 capsule by mouth daily.     vitamin C 500 MG tablet  Commonly known as:  ASCORBIC ACID  Take 500 mg by mouth daily.     Vitamin D 2000 UNITS Caps  Take 2,000 Units by mouth daily.       No Known Allergies Follow-up Information   Follow up with HUSAIN,KARRAR, MD. Schedule an  appointment as soon as possible for a visit in 1 week.   Specialty:  Internal Medicine   Contact information:   [address] [place] [address] [phone]        The results of significant diagnostics from this hospitalization (including imaging, microbiology, ancillary and laboratory) are listed below for reference.    Significant Diagnostic Studies: Ct Head Wo Contrast  07/05/2013   CLINICAL DATA:  Nausea, vomiting and vertigo. Weakness. History of stroke and hypertension.  EXAM: CT HEAD WITHOUT CONTRAST  TECHNIQUE: Contiguous axial images were obtained from the base of the skull through the vertex without intravenous contrast.  COMPARISON:  08/04/2010.  FINDINGS: No mass lesion, mass effect, midline shift, hydrocephalus, hemorrhage. No acute territorial cortical ischemia/infarct. Atrophy and chronic ischemic white matter disease is present. Right lens extraction. Scout images appear within normal limits. Mild ethmoid mucosal thickening.  IMPRESSION: Atrophy and chronic ischemic white matter disease without acute intracranial abnormality.   Electronically Signed   By: Andreas Newport M.D.   On: 07/05/2013 07:08   Mr Maxine Glenn Head Wo Contrast  07/05/2013   CLINICAL DATA:  80 year old male with vertigo. Nausea and vomiting.  EXAM: MRI HEAD WITHOUT AND WITH CONTRAST  MRA HEAD WITHOUT CONTRAST  MRA NECK WITHOUT AND WITH CONTRAST  TECHNIQUE: Multiplanar, multiecho pulse sequences of the brain and surrounding structures were obtained without and with intravenous contrast. Angiographic images of the Circle of Willis were obtained using MRA technique without intravenous contrast. Angiographic images of the neck were obtained using MRA technique without and with intravenous contrast. Carotid stenosis measurements (when applicable) are obtained utilizing NASCET criteria, using the distal internal carotid diameter as the denominator.  CONTRAST:  15mL MULTIHANCE GADOBENATE DIMEGLUMINE 529  MG/ML IV SOLN  COMPARISON:  Head CT without contrast 0647 hr the same day. Brain MRI, MRA and neck MRA 08/04/2010.  FINDINGS: MRI HEAD FINDINGS  Stable cerebral volume. Major intracranial vascular flow voids are stable.  Small chronic lacunar infarcts in the medial left thalamus, inferior right cerebellum. No restricted diffusion or evidence of acute infarction. Visible internal auditory structures appear normal.  No midline shift, mass effect, evidence of mass lesion, ventriculomegaly, extra-axial collection or acute intracranial hemorrhage. Cervicomedullary junction and pituitary are within normal limits. Negative visualized upper cervical spine except for degenerative ligamentous hypertrophy about the odontoid. Stable scattered and patchy nonspecific cerebral white matter T2 and FLAIR hyperintensity. No cerebral cortical encephalomalacia. No abnormal enhancement identified.  Visualized scalp soft tissues are within  normal limits. Mastoids are clear. Minor paranasal sinus mucosal thickening is unchanged. Interval postoperative changes to the right globe. Visualized bone marrow signal is within normal limits.  MRA HEAD FINDINGS  Stable antegrade flow in the posterior circulation with dominant right vertebral artery. Normal PICA origins. Patent vertebrobasilar junction. No basilar stenosis. Normal SCA and left PCA origins. Fetal type right PCA origin. Left posterior communicating artery diminutive or absent.  Bilateral PCA branches are normal.  Stable antegrade flow in both ICA siphons. Cavernous segment irregularity compatible with atherosclerosis. Stable probable atherosclerotic pseudo lesion directed laterally just proximal to the anterior genu (series 5, image 79). Ophthalmic and right posterior communicating artery origins are stable and normal.  Patent carotid termini. Stable and normal MCA and ACA origins. Tortuous left A1 segment. Anterior communicating artery and visualized ACA branches are within normal  limits. Visualized bilateral MCA branches are stable and within normal limits.  MRA NECK FINDINGS  Precontrast time-of-flight imaging of the neck demonstrates Stable antegrade flow in both carotid and vertebral arteries. Normal appearance of the carotid bifurcations. Antegrade flow signal continues in all 4 vessels to the skullbase.  Post-contrast MRA images reveal a 3 vessel arch configuration with no great vessel origin stenosis.  Proximal great vessels are mildly to moderately tortuous. Otherwise negative common carotid arteries. The right ICA origin and bulb are widely patent. Negative cervical right ICA.  On the left there is a focal filling defect at the posterior lateral left ICA origin and proximal bulb. Subsequent stenosis is up to 50 % with respect to the distal vessel. Beyond this level, the cervical left ICA is normal.  No proximal subclavian artery stenosis. There is mild stenosis at both vertebral artery origins. The right vertebral artery is mildly dominant throughout its course to the basilar. No other vertebral artery stenosis.  IMPRESSION: 1. No acute intracranial abnormality. Stable mild chronic small vessel ischemia since 2012. 2. Chronic left ICA origin atherosclerotic stenosis of 50% (not hemodynamically significant). 3. Mild stenosis at both vertebral artery origins. 4. Stable intracranial MRA, negative except for ICA siphon atherosclerosis.   Electronically Signed   By: Augusto Gamble M.D.   On: 07/05/2013 09:55   Mr Angiogram Neck W Wo Contrast  07/05/2013   CLINICAL DATA:  80 year old male with vertigo. Nausea and vomiting.  EXAM: MRI HEAD WITHOUT AND WITH CONTRAST  MRA HEAD WITHOUT CONTRAST  MRA NECK WITHOUT AND WITH CONTRAST  TECHNIQUE: Multiplanar, multiecho pulse sequences of the brain and surrounding structures were obtained without and with intravenous contrast. Angiographic images of the Circle of Willis were obtained using MRA technique without intravenous contrast. Angiographic images  of the neck were obtained using MRA technique without and with intravenous contrast. Carotid stenosis measurements (when applicable) are obtained utilizing NASCET criteria, using the distal internal carotid diameter as the denominator.  CONTRAST:  15mL MULTIHANCE GADOBENATE DIMEGLUMINE 529 MG/ML IV SOLN  COMPARISON:  Head CT without contrast 0647 hr the same day. Brain MRI, MRA and neck MRA 08/04/2010.  FINDINGS: MRI HEAD FINDINGS  Stable cerebral volume. Major intracranial vascular flow voids are stable.  Small chronic lacunar infarcts in the medial left thalamus, inferior right cerebellum. No restricted diffusion or evidence of acute infarction. Visible internal auditory structures appear normal.  No midline shift, mass effect, evidence of mass lesion, ventriculomegaly, extra-axial collection or acute intracranial hemorrhage. Cervicomedullary junction and pituitary are within normal limits. Negative visualized upper cervical spine except for degenerative ligamentous hypertrophy about the odontoid. Stable scattered and patchy nonspecific  cerebral white matter T2 and FLAIR hyperintensity. No cerebral cortical encephalomalacia. No abnormal enhancement identified.  Visualized scalp soft tissues are within normal limits. Mastoids are clear. Minor paranasal sinus mucosal thickening is unchanged. Interval postoperative changes to the right globe. Visualized bone marrow signal is within normal limits.  MRA HEAD FINDINGS  Stable antegrade flow in the posterior circulation with dominant right vertebral artery. Normal PICA origins. Patent vertebrobasilar junction. No basilar stenosis. Normal SCA and left PCA origins. Fetal type right PCA origin. Left posterior communicating artery diminutive or absent.  Bilateral PCA branches are normal.  Stable antegrade flow in both ICA siphons. Cavernous segment irregularity compatible with atherosclerosis. Stable probable atherosclerotic pseudo lesion directed laterally just proximal to  the anterior genu (series 5, image 79). Ophthalmic and right posterior communicating artery origins are stable and normal.  Patent carotid termini. Stable and normal MCA and ACA origins. Tortuous left A1 segment. Anterior communicating artery and visualized ACA branches are within normal limits. Visualized bilateral MCA branches are stable and within normal limits.  MRA NECK FINDINGS  Precontrast time-of-flight imaging of the neck demonstrates Stable antegrade flow in both carotid and vertebral arteries. Normal appearance of the carotid bifurcations. Antegrade flow signal continues in all 4 vessels to the skullbase.  Post-contrast MRA images reveal a 3 vessel arch configuration with no great vessel origin stenosis.  Proximal great vessels are mildly to moderately tortuous. Otherwise negative common carotid arteries. The right ICA origin and bulb are widely patent. Negative cervical right ICA.  On the left there is a focal filling defect at the posterior lateral left ICA origin and proximal bulb. Subsequent stenosis is up to 50 % with respect to the distal vessel. Beyond this level, the cervical left ICA is normal.  No proximal subclavian artery stenosis. There is mild stenosis at both vertebral artery origins. The right vertebral artery is mildly dominant throughout its course to the basilar. No other vertebral artery stenosis.  IMPRESSION: 1. No acute intracranial abnormality. Stable mild chronic small vessel ischemia since 2012. 2. Chronic left ICA origin atherosclerotic stenosis of 50% (not hemodynamically significant). 3. Mild stenosis at both vertebral artery origins. 4. Stable intracranial MRA, negative except for ICA siphon atherosclerosis.   Electronically Signed   By: Augusto Gamble M.D.   On: 07/05/2013 09:55   Mr Laqueta Jean ZO Contrast  07/05/2013   CLINICAL DATA:  80 year old male with vertigo. Nausea and vomiting.  EXAM: MRI HEAD WITHOUT AND WITH CONTRAST  MRA HEAD WITHOUT CONTRAST  MRA NECK WITHOUT AND WITH  CONTRAST  TECHNIQUE: Multiplanar, multiecho pulse sequences of the brain and surrounding structures were obtained without and with intravenous contrast. Angiographic images of the Circle of Willis were obtained using MRA technique without intravenous contrast. Angiographic images of the neck were obtained using MRA technique without and with intravenous contrast. Carotid stenosis measurements (when applicable) are obtained utilizing NASCET criteria, using the distal internal carotid diameter as the denominator.  CONTRAST:  15mL MULTIHANCE GADOBENATE DIMEGLUMINE 529 MG/ML IV SOLN  COMPARISON:  Head CT without contrast 0647 hr the same day. Brain MRI, MRA and neck MRA 08/04/2010.  FINDINGS: MRI HEAD FINDINGS  Stable cerebral volume. Major intracranial vascular flow voids are stable.  Small chronic lacunar infarcts in the medial left thalamus, inferior right cerebellum. No restricted diffusion or evidence of acute infarction. Visible internal auditory structures appear normal.  No midline shift, mass effect, evidence of mass lesion, ventriculomegaly, extra-axial collection or acute intracranial hemorrhage. Cervicomedullary junction and pituitary are  within normal limits. Negative visualized upper cervical spine except for degenerative ligamentous hypertrophy about the odontoid. Stable scattered and patchy nonspecific cerebral white matter T2 and FLAIR hyperintensity. No cerebral cortical encephalomalacia. No abnormal enhancement identified.  Visualized scalp soft tissues are within normal limits. Mastoids are clear. Minor paranasal sinus mucosal thickening is unchanged. Interval postoperative changes to the right globe. Visualized bone marrow signal is within normal limits.  MRA HEAD FINDINGS  Stable antegrade flow in the posterior circulation with dominant right vertebral artery. Normal PICA origins. Patent vertebrobasilar junction. No basilar stenosis. Normal SCA and left PCA origins. Fetal type right PCA origin.  Left posterior communicating artery diminutive or absent.  Bilateral PCA branches are normal.  Stable antegrade flow in both ICA siphons. Cavernous segment irregularity compatible with atherosclerosis. Stable probable atherosclerotic pseudo lesion directed laterally just proximal to the anterior genu (series 5, image 79). Ophthalmic and right posterior communicating artery origins are stable and normal.  Patent carotid termini. Stable and normal MCA and ACA origins. Tortuous left A1 segment. Anterior communicating artery and visualized ACA branches are within normal limits. Visualized bilateral MCA branches are stable and within normal limits.  MRA NECK FINDINGS  Precontrast time-of-flight imaging of the neck demonstrates Stable antegrade flow in both carotid and vertebral arteries. Normal appearance of the carotid bifurcations. Antegrade flow signal continues in all 4 vessels to the skullbase.  Post-contrast MRA images reveal a 3 vessel arch configuration with no great vessel origin stenosis.  Proximal great vessels are mildly to moderately tortuous. Otherwise negative common carotid arteries. The right ICA origin and bulb are widely patent. Negative cervical right ICA.  On the left there is a focal filling defect at the posterior lateral left ICA origin and proximal bulb. Subsequent stenosis is up to 50 % with respect to the distal vessel. Beyond this level, the cervical left ICA is normal.  No proximal subclavian artery stenosis. There is mild stenosis at both vertebral artery origins. The right vertebral artery is mildly dominant throughout its course to the basilar. No other vertebral artery stenosis.  IMPRESSION: 1. No acute intracranial abnormality. Stable mild chronic small vessel ischemia since 2012. 2. Chronic left ICA origin atherosclerotic stenosis of 50% (not hemodynamically significant). 3. Mild stenosis at both vertebral artery origins. 4. Stable intracranial MRA, negative except for ICA siphon  atherosclerosis.   Electronically Signed   By: Augusto Gamble M.D.   On: 07/05/2013 09:55   Dg Chest Portable 1 View  07/05/2013   CLINICAL DATA:  Shortness of breath, chest pain, nausea and weakness. Vomiting.  EXAM: PORTABLE CHEST - 1 VIEW  COMPARISON:  Chest radiograph performed 01/23/2012  FINDINGS: The lungs are well-aerated and clear. There is no evidence of focal opacification, pleural effusion or pneumothorax.  The cardiomediastinal silhouette is within normal limits. No acute osseous abnormalities are seen.  IMPRESSION: No acute cardiopulmonary process seen.   Electronically Signed   By: Roanna Raider M.D.   On: 07/05/2013 06:47    Microbiology: No results found for this or any previous visit (from the past 240 hour(s)).   Labs: Basic Metabolic Panel:  Recent Labs Lab 07/05/13 0627 07/05/13 1150 07/06/13 0317  NA 140  --  140  K 3.8  --  3.8  CL 103  --  103  CO2 24  --  27  GLUCOSE 122*  --  94  BUN 16  --  19  CREATININE 0.90 0.84 1.15  CALCIUM 8.9  --  9.0  Liver Function Tests:  Recent Labs Lab 07/06/13 0317  AST 17  ALT 18  ALKPHOS 42  BILITOT 0.4  PROT 6.1  ALBUMIN 3.0*   No results found for this basename: LIPASE, AMYLASE,  in the last 168 hours No results found for this basename: AMMONIA,  in the last 168 hours CBC:  Recent Labs Lab 07/05/13 0627 07/05/13 1150 07/06/13 0317  WBC 8.0 7.7 7.9  HGB 13.7 13.9 13.2  HCT 41.2 41.2 40.6  MCV 84.1 84.3 84.9  PLT 134* 149* 148*   Cardiac Enzymes:  Recent Labs Lab 07/05/13 0627 07/05/13 1530 07/05/13 2202 07/06/13 0317  TROPONINI <0.30 <0.30 <0.30 <0.30   BNP: BNP (last 3 results) No results found for this basename: PROBNP,  in the last 8760 hours CBG: No results found for this basename: GLUCAP,  in the last 168 hours  Signed:  Jerald KiefStephen K Shayde Gervacio  [facility] 07/06/2013, 3:16 PM

## 2013-07-06 NOTE — Progress Notes (Signed)
  Echocardiogram 2D Echocardiogram has been performed.  Real ConsChristy F Ciaran Begay 07/06/2013, 8:54 AM

## 2013-07-08 ENCOUNTER — Telehealth: Payer: Self-pay | Admitting: Cardiovascular Disease

## 2013-07-08 ENCOUNTER — Ambulatory Visit (HOSPITAL_COMMUNITY): Payer: Medicare HMO

## 2013-07-08 ENCOUNTER — Other Ambulatory Visit (HOSPITAL_COMMUNITY): Payer: Commercial Managed Care - HMO

## 2013-07-08 NOTE — ED Provider Notes (Signed)
Medical screening examination/treatment/procedure(s) were conducted as a shared visit with non-physician practitioner(s) and myself.  I personally evaluated the patient during the encounter  Please see my separate respective documentation pertaining to this patient encounter   Vida RollerBrian D Angela Vazguez, MD 07/08/13 1731

## 2013-07-08 NOTE — Telephone Encounter (Signed)
Returned call to patient stress echo and instructions explained.He stated he was still weak.Advised to let tech know.Stress Echo scheduled for today 07/08/13 at 3:00 pm.

## 2013-07-08 NOTE — Progress Notes (Signed)
Stress echo not performed. Patient unable to walk on treadmill.

## 2013-07-08 NOTE — Telephone Encounter (Signed)
New Message:  Pt is requesting a call back to explain to him what exactly a stress echo test is.Marland Kitchen..Marland Kitchen

## 2013-07-10 ENCOUNTER — Ambulatory Visit (HOSPITAL_COMMUNITY): Payer: Medicare HMO | Attending: Cardiology | Admitting: Radiology

## 2013-07-10 ENCOUNTER — Encounter (INDEPENDENT_AMBULATORY_CARE_PROVIDER_SITE_OTHER): Payer: Self-pay

## 2013-07-10 VITALS — BP 112/68 | HR 53 | Ht 60.0 in | Wt 152.0 lb

## 2013-07-10 DIAGNOSIS — R0602 Shortness of breath: Secondary | ICD-10-CM | POA: Diagnosis not present

## 2013-07-10 DIAGNOSIS — R079 Chest pain, unspecified: Secondary | ICD-10-CM | POA: Insufficient documentation

## 2013-07-10 MED ORDER — REGADENOSON 0.4 MG/5ML IV SOLN
0.4000 mg | Freq: Once | INTRAVENOUS | Status: AC
  Administered 2013-07-10: 0.4 mg via INTRAVENOUS

## 2013-07-10 MED ORDER — TECHNETIUM TC 99M SESTAMIBI GENERIC - CARDIOLITE
33.0000 | Freq: Once | INTRAVENOUS | Status: AC | PRN
  Administered 2013-07-10: 33 via INTRAVENOUS

## 2013-07-10 MED ORDER — TECHNETIUM TC 99M SESTAMIBI GENERIC - CARDIOLITE
11.0000 | Freq: Once | INTRAVENOUS | Status: AC | PRN
  Administered 2013-07-10: 11 via INTRAVENOUS

## 2013-07-10 NOTE — Progress Notes (Signed)
[facility] SITE 3 NUCLEAR MED [address] [place], [place] [address] [phone]    Cardiology Nuclear Med Study  Terry Gray is a 80 y.o. male     MRN : [id number]     DOB: 1934-02-01  Procedure Date: 07/10/2013  Nuclear Med Background Indication for Stress Test:  Evaluation for Ischemia History:  No known CAD, Echo 2012 EF normal Cardiac Risk Factors: CVA, Family History - CAD, History of Smoking, Hypertension and Lipids  Symptoms:  Chest Pain, DOE, Fatigue and SOB   Nuclear Pre-Procedure Caffeine/Decaff Intake:  None NPO After: 8:00pm   Lungs:  clear O2 Sat: 95% on room air. IV 0.9% NS with Angio Cath:  22g  IV Site: R Antecubital  IV Started by:  Bonnita LevanJackie Smith, RN  Chest Size (in):  44 Cup Size: n/a  Height: 5' (1.524 m)  Weight:  152 lb (68.947 kg)  BMI:  Body mass index is 29.69 kg/(m^2). Tech Comments:  N/A    Nuclear Med Study 1 or 2 day study: 1 day  Stress Test Type:  Lexiscan  Reading MD: N/A  Order Authorizing Provider:  Charlton HawsPeter Nishan, MD  Resting Radionuclide: Technetium 374m Sestamibi  Resting Radionuclide Dose: 11.0 mCi   Stress Radionuclide:  Technetium 844m Sestamibi  Stress Radionuclide Dose:33.0 mCi           Stress Protocol Rest HR: 53 Stress HR: 71  Rest BP: 112/68 Stress BP: 115/57  Exercise Time (min): n/a METS: n/a           Dose of Adenosine (mg):  n/a Dose of Lexiscan: 0.4 mg  Dose of Atropine (mg): n/a Dose of Dobutamine: n/a mcg/kg/min (at max HR)  Stress Test Technologist: Nelson ChimesSharon Brooks, BS-ES  Nuclear Technologist:  Domenic PoliteStephen Carbone, CNMT     Rest Procedure:  Myocardial perfusion imaging was performed at rest 45 minutes following the intravenous administration of Technetium 314m Sestamibi. Rest ECG: NSR - Normal EKG  Stress Procedure:  The patient received IV Lexiscan 0.4 mg over 15-seconds.  Technetium 324m Sestamibi injected at 30-seconds.  Quantitative spect images were obtained after a 45 minute delay.  During the  infusion of Lexiscan, the patient complained of a funny sensation all over, headache, SOB and stomach cramps.  These symptoms except for the headache resolved in recovery.  Stress ECG: No significant change from baseline ECG  QPS Raw Data Images:  Normal; no motion artifact; normal heart/lung ratio. Stress Images:  Normal homogeneous uptake in all areas of the myocardium. Rest Images:  Normal homogeneous uptake in all areas of the myocardium. Subtraction (SDS):  Normal Transient Ischemic Dilatation (Normal <1.22):  1.00 Lung/Heart Ratio (Normal <0.45):  0.30  Quantitative Gated Spect Images QGS EDV:  71 ml QGS ESV:  27 ml  Impression Exercise Capacity:  Lexiscan with no exercise. BP Response:  Normal blood pressure response. Clinical Symptoms:  There is dyspnea. ECG Impression:  No significant ST segment change suggestive of ischemia. Comparison with Prior Nuclear Study: No images to compare  Overall Impression:  Normal stress nuclear study.  LV Ejection Fraction: 62%.  LV Wall Motion:  NL LV Function; NL Wall Motion  Signed: Armanda Magicraci Suzzane Quilter, MD [facility]

## 2013-07-12 ENCOUNTER — Encounter (INDEPENDENT_AMBULATORY_CARE_PROVIDER_SITE_OTHER): Payer: Commercial Managed Care - HMO

## 2013-07-12 ENCOUNTER — Other Ambulatory Visit: Payer: Self-pay | Admitting: *Deleted

## 2013-07-12 ENCOUNTER — Encounter: Payer: Self-pay | Admitting: Radiology

## 2013-07-12 DIAGNOSIS — R42 Dizziness and giddiness: Secondary | ICD-10-CM

## 2013-07-12 DIAGNOSIS — R9431 Abnormal electrocardiogram [ECG] [EKG]: Secondary | ICD-10-CM

## 2013-07-12 DIAGNOSIS — I4891 Unspecified atrial fibrillation: Secondary | ICD-10-CM

## 2013-07-12 NOTE — Progress Notes (Signed)
Patient ID: Terry Gray, male   DOB: Jan 15, 1934, 80 y.o.   MRN: [id number] 30 day lifewatch monitor applied

## 2013-07-18 ENCOUNTER — Encounter: Payer: Commercial Managed Care - HMO | Admitting: Physician Assistant

## 2013-08-08 ENCOUNTER — Telehealth: Payer: Self-pay | Admitting: *Deleted

## 2013-08-08 NOTE — Telephone Encounter (Signed)
         [facility] ','<More Detail >>       Wendall Stade, MD       Sent: Caleen Essex Jul 26, 2013 3:22 PM    To: Alois Cliche, LPN                   Message     Overall pretty normal Normal EF normal valves just mild RV enlargement     ----- Message -----    From: Alois Cliche, LPN    Sent: 06/29/9561 10:56 AM    To: Wendall Stade, MD                                  PT  AWARE OF ECHO RESULTS./CY

## 2013-08-23 ENCOUNTER — Telehealth: Payer: Self-pay | Admitting: *Deleted

## 2013-08-23 NOTE — Telephone Encounter (Signed)
PT  AWARE  OF MONITOR  RESULTS PER  DR NISHAN NSR PAC/PVC  NO  SIG  ARRHYTHMIAS./CY

## 2013-08-23 NOTE — Telephone Encounter (Signed)
PT  NOTIFIED ./CY 

## 2013-11-18 ENCOUNTER — Other Ambulatory Visit: Payer: Self-pay | Admitting: Gastroenterology

## 2013-11-19 ENCOUNTER — Encounter (HOSPITAL_COMMUNITY): Payer: Self-pay | Admitting: *Deleted

## 2013-11-19 ENCOUNTER — Encounter (HOSPITAL_COMMUNITY): Payer: Self-pay | Admitting: Pharmacy Technician

## 2013-11-26 ENCOUNTER — Other Ambulatory Visit: Payer: Self-pay | Admitting: Gastroenterology

## 2013-11-28 ENCOUNTER — Ambulatory Visit (HOSPITAL_COMMUNITY)
Admission: RE | Admit: 2013-11-28 | Discharge: 2013-11-28 | Disposition: A | Discharge status: Home / Self Care | Payer: Medicare HMO | Source: Ambulatory Visit | Attending: Gastroenterology | Admitting: Gastroenterology

## 2013-11-28 ENCOUNTER — Encounter (HOSPITAL_COMMUNITY)
Admission: RE | Disposition: A | Discharge status: Home / Self Care | Payer: Self-pay | Source: Ambulatory Visit | Attending: Gastroenterology

## 2013-11-28 ENCOUNTER — Encounter (HOSPITAL_COMMUNITY): Payer: Medicare HMO | Admitting: Certified Registered Nurse Anesthetist

## 2013-11-28 ENCOUNTER — Ambulatory Visit (HOSPITAL_COMMUNITY): Payer: Medicare HMO | Admitting: Certified Registered Nurse Anesthetist

## 2013-11-28 ENCOUNTER — Encounter (HOSPITAL_COMMUNITY): Payer: Self-pay

## 2013-11-28 DIAGNOSIS — R29898 Other symptoms and signs involving the musculoskeletal system: Secondary | ICD-10-CM | POA: Insufficient documentation

## 2013-11-28 DIAGNOSIS — K921 Melena: Secondary | ICD-10-CM | POA: Insufficient documentation

## 2013-11-28 DIAGNOSIS — I69998 Other sequelae following unspecified cerebrovascular disease: Secondary | ICD-10-CM | POA: Diagnosis not present

## 2013-11-28 DIAGNOSIS — Z87891 Personal history of nicotine dependence: Secondary | ICD-10-CM | POA: Insufficient documentation

## 2013-11-28 DIAGNOSIS — Z7982 Long term (current) use of aspirin: Secondary | ICD-10-CM | POA: Insufficient documentation

## 2013-11-28 DIAGNOSIS — K219 Gastro-esophageal reflux disease without esophagitis: Secondary | ICD-10-CM | POA: Diagnosis not present

## 2013-11-28 DIAGNOSIS — Z7902 Long term (current) use of antithrombotics/antiplatelets: Secondary | ICD-10-CM | POA: Diagnosis not present

## 2013-11-28 DIAGNOSIS — K5900 Constipation, unspecified: Secondary | ICD-10-CM | POA: Insufficient documentation

## 2013-11-28 DIAGNOSIS — K5289 Other specified noninfective gastroenteritis and colitis: Secondary | ICD-10-CM | POA: Diagnosis not present

## 2013-11-28 DIAGNOSIS — K633 Ulcer of intestine: Secondary | ICD-10-CM | POA: Insufficient documentation

## 2013-11-28 DIAGNOSIS — N400 Enlarged prostate without lower urinary tract symptoms: Secondary | ICD-10-CM | POA: Diagnosis not present

## 2013-11-28 DIAGNOSIS — I498 Other specified cardiac arrhythmias: Secondary | ICD-10-CM | POA: Insufficient documentation

## 2013-11-28 DIAGNOSIS — I4891 Unspecified atrial fibrillation: Secondary | ICD-10-CM | POA: Diagnosis not present

## 2013-11-28 DIAGNOSIS — IMO0002 Reserved for concepts with insufficient information to code with codable children: Secondary | ICD-10-CM | POA: Diagnosis not present

## 2013-11-28 DIAGNOSIS — E7800 Pure hypercholesterolemia, unspecified: Secondary | ICD-10-CM | POA: Insufficient documentation

## 2013-11-28 DIAGNOSIS — I10 Essential (primary) hypertension: Secondary | ICD-10-CM | POA: Diagnosis not present

## 2013-11-28 HISTORY — PX: COLONOSCOPY WITH PROPOFOL: SHX5780

## 2013-11-28 HISTORY — DX: Cardiac arrhythmia, unspecified: I49.9

## 2013-11-28 HISTORY — PX: ESOPHAGOGASTRODUODENOSCOPY (EGD) WITH PROPOFOL: SHX5813

## 2013-11-28 SURGERY — COLONOSCOPY WITH PROPOFOL
Anesthesia: Monitor Anesthesia Care

## 2013-11-28 MED ORDER — PROPOFOL 10 MG/ML IV BOLUS
INTRAVENOUS | Status: AC
  Filled 2013-11-28: qty 40

## 2013-11-28 MED ORDER — BUTAMBEN-TETRACAINE-BENZOCAINE 2-2-14 % EX AERO: INHALATION_SPRAY | CUTANEOUS | Status: DC | PRN

## 2013-11-28 MED ORDER — PROPOFOL INFUSION 10 MG/ML OPTIME
INTRAVENOUS | Status: DC | PRN
  Administered 2013-11-28: 100 ug/kg/min via INTRAVENOUS

## 2013-11-28 MED ORDER — BUTAMBEN-TETRACAINE-BENZOCAINE 2-2-14 % EX AERO
INHALATION_SPRAY | CUTANEOUS | Status: DC | PRN
  Administered 2013-11-28: 1 via TOPICAL

## 2013-11-28 MED ORDER — LACTATED RINGERS IV SOLN
INTRAVENOUS | Status: DC
  Administered 2013-11-28: 1000 mL via INTRAVENOUS

## 2013-11-28 MED ORDER — SODIUM CHLORIDE 0.9 % IV SOLN: INTRAVENOUS | Status: DC

## 2013-11-28 SURGICAL SUPPLY — 25 items

## 2013-11-28 NOTE — Anesthesia Preprocedure Evaluation (Signed)
Anesthesia Evaluation  Patient identified by MRN, date of birth, ID band Patient awake    Reviewed: Allergy & Precautions, H&P , NPO status , Patient's Chart, lab work & pertinent test results  History of Anesthesia Complications Negative for: history of anesthetic complications  Airway Mallampati: II TM Distance: >3 FB Neck ROM: Full    Dental  (+) Caps, Dental Advisory Given, Teeth Intact   Pulmonary neg pulmonary ROS, shortness of breath and with exertion, former smoker,  breath sounds clear to auscultation  Pulmonary exam normal       Cardiovascular hypertension, Pt. 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, Residual Symptoms    GI/Hepatic negative GI ROS, Neg liver ROS, GERD-  Controlled and Medicated,  Endo/Other  negative endocrine ROS  Renal/GU negative Renal ROS     Musculoskeletal   Abdominal   Peds  Hematology   Anesthesia Other Findings   Reproductive/Obstetrics                           Anesthesia Physical  Anesthesia Plan  ASA: III  Anesthesia Plan: MAC   Post-op Pain Management:    Induction: Intravenous  Airway Management Planned: Natural Airway  Additional Equipment:   Intra-op Plan:   Post-operative Plan:   Informed Consent: I have reviewed the patients History and Physical, chart, labs and discussed the procedure including the risks, benefits and alternatives for the proposed anesthesia with the patient or authorized representative who has indicated his/her understanding and acceptance.   Dental advisory given  Plan Discussed with: CRNA, Anesthesiologist and Surgeon  Anesthesia Plan Comments:         Anesthesia Quick Evaluation

## 2013-11-28 NOTE — H&P (Signed)
  Problem: Heme positive stool by digital rectal exam. Normal hemoglobin. Chronic aspirin and Plavix therapy. Normal colonoscopy performed on 03/28/2006. Normal abdominal ultrasound performed on 10/18/2012.  History: The patient is a 80 year old male born 03/18/1933. He chronically takes aspirin 81 mg plus Plavix on a daily basis. He underwent a normal screening colonoscopy in January 2008 and normal abdominal ultrasound in August 2014.  The patient has developed constipation manifested by the passage of hard stools with straining and abdominal cramps. He has started taking Metamucil. He reports no gastrointestinal bleeding but did pass a dark stool recently which has not recurred. There is no past history of peptic ulcer disease.  On 11/18/2013 patient underwent his health maintenance physical exam. His stool was heme positive by digital rectal exam. His hemoglobin was normal.  The patient is scheduled to undergo diagnostic esophagogastroduodenoscopy and colonoscopy.  Medication: None  Past medical history: Right cataract surgery. Hypertension. Hypercholesterolemia. Seasonal allergic rhinitis. Transient ischemic attack in may 2012. Lumbar radiculopathy with bulging disc. Benign prostatic hypertrophy.  Exam: The patient is alert and lying comfortably on the endoscopy stretcher. Lungs are clear to auscultation. Cardiac exam reveals a regular rhythm. Abdomen is soft and nontender to palpation.  Plan: Proceed with diagnostic esophagogastroduodenoscopy and colonoscopy

## 2013-11-28 NOTE — Discharge Instructions (Addendum)
Gastrointestinal Endoscopy, Care After °Refer to this sheet in the next few weeks. These instructions provide you with information on caring for yourself after your procedure. Your caregiver may also give you more specific instructions. Your treatment has been planned according to current medical practices, but problems sometimes occur. Call your caregiver if you have any problems or questions after your procedure. °HOME CARE INSTRUCTIONS °· If you were given medicine to help you relax (sedative), do not drive, operate machinery, or sign important documents for 24 hours. °· Avoid alcohol and hot or warm beverages for the first 24 hours after the procedure. °· Only take over-the-counter or prescription medicines for pain, discomfort, or fever as directed by your caregiver. You may resume taking your normal medicines unless your caregiver tells you otherwise. Ask your caregiver when you may resume taking medicines that may cause bleeding, such as aspirin, clopidogrel, or warfarin. °· You may return to your normal diet and activities on the day after your procedure, or as directed by your caregiver. Walking may help to reduce any bloated feeling in your abdomen. °· Drink enough fluids to keep your urine clear or pale yellow. °· You may gargle with salt water if you have a sore throat. °SEEK IMMEDIATE MEDICAL CARE IF: °· You have severe nausea or vomiting. °· You have severe abdominal pain, abdominal cramps that last longer than 6 hours, or abdominal swelling (distention). °· You have severe shoulder or back pain. °· You have trouble swallowing. °· You have shortness of breath, your breathing is shallow, or you are breathing faster than normal. °· You have a fever or a rapid heartbeat. °· You vomit blood or material that looks like coffee grounds. °· You have bloody, black, or tarry stools. °MAKE SURE YOU: °· Understand these instructions. °· Will watch your condition. °· Will get help right away if you are not doing  well or get worse. °Document Released: 10/06/2003 Document Revised: 07/08/2013 Document Reviewed: 05/24/2011 °[employer]® Patient Information ©2015 [employer]. This information is not intended to replace advice given to you by your health care provider. Make sure you discuss any questions you have with your health care provider. °Colonoscopy, Care After °Refer to this sheet in the next few weeks. These instructions provide you with information on caring for yourself after your procedure. Your health care provider may also give you more specific instructions. Your treatment has been planned according to current medical practices, but problems sometimes occur. Call your health care provider if you have any problems or questions after your procedure. °WHAT TO EXPECT AFTER THE PROCEDURE  °After your procedure, it is typical to have the following: °· A small amount of blood in your stool. °· Moderate amounts of gas and mild abdominal cramping or bloating. °HOME CARE INSTRUCTIONS °· Do not drive, operate machinery, or sign important documents for 24 hours. °· You may shower and resume your regular physical activities, but move at a slower pace for the first 24 hours. °· Take frequent rest periods for the first 24 hours. °· Walk around or put a warm pack on your abdomen to help reduce abdominal cramping and bloating. °· Drink enough fluids to keep your urine clear or pale yellow. °· You may resume your normal diet as instructed by your health care provider. Avoid heavy or fried foods that are hard to digest. °· Avoid drinking alcohol for 24 hours or as instructed by your health care provider. °· Only take over-the-counter or prescription medicines as directed by your   health care provider. °· If a tissue sample (biopsy) was taken during your procedure: °· Do not take aspirin or blood thinners for 7 days, or as instructed by your health care provider. °· Do not drink alcohol for 7 days, or as instructed by your health care  provider. °· Eat soft foods for the first 24 hours. °SEEK MEDICAL CARE IF: °You have persistent spotting of blood in your stool 2-3 days after the procedure. °SEEK IMMEDIATE MEDICAL CARE IF: °· You have more than a small spotting of blood in your stool. °· You pass large blood clots in your stool. °· Your abdomen is swollen (distended). °· You have nausea or vomiting. °· You have a fever. °· You have increasing abdominal pain that is not relieved with medicine. °Document Released: 10/06/2003 Document Revised: 12/12/2012 Document Reviewed: 10/29/2012 °[employer]® Patient Information ©2015 [employer]. This information is not intended to replace advice given to you by your health care provider. Make sure you discuss any questions you have with your health care provider. ° °

## 2013-11-28 NOTE — Op Note (Signed)
Problem: Heme positive stool by digital rectal exam. Normal hemoglobin. Chronic aspirin and Plavix therapy. Normal colonoscopy performed on 03/28/2006. Normal abdominal ultrasound performed on 10/18/2012.  Endoscopist: Danise Edge  Premedication: Propofol administered by anesthesia  Procedure: Diagnostic esophagogastroduodenoscopy The patient was placed in the left lateral decubitus position. The Pentax gastroscope was passed through the posterior hypopharynx into the proximal esophagus without difficulty. The hypopharynx, larynx, and vocal cords appeared normal.  Esophagoscopy: The proximal, mid, and lower segments of the esophageal mucosa appeared normal. The squamocolumnar junction appeared regular and was noted at 40 cm from the incisor teeth.  Gastroscopy: Retroflex view of the gastric cardia and fundus was normal. The gastric body, antrum, and pylorus appeared normal.  Duodenoscopy: The duodenal bulb, second portion of duodenum, and third portion of duodenum appeared normal.  Assessment: Normal esophagogastroduodenoscopy  Procedure: Diagnostic colonoscopy The patient was placed in the left lateral decubitus position. Anal inspection and digital rectal exam were normal. The Pentax pediatric colonoscope was introduced into the rectum and advanced to the cecum. A normal-appearing appendiceal orifice was identified. An ulcerated ileocecal valve was intubated and the terminal ileum inspected. Colonic preparation for the exam today was good  Rectum. Normal. Retroflexed view of the distal rectum normal  Sigmoid colon and descending colon. Normal  Splenic flexure. Normal  Transverse colon. Normal  Hepatic flexure. Normal  Descending colon. Normal  Cecum and ileocecal valve. The cecum appeared normal. On the surface of the ileocecal valve was a shallow ulcer approximately 4 mm in diameter. The ulcer was biopsied stimulating bleeding. The bleeding site was hemostasis using 2  endoclips.  Terminal ileum. Normal  Assessment: Shallow ulceration on the surface of the ileocecal valve biopsied. Endoclips applied to the biopsy site to stop bleeding. Otherwise normal colonoscopy.  Plan: Await ileocecal valve ulcer biopsy

## 2013-11-28 NOTE — Transfer of Care (Signed)
Immediate Anesthesia Transfer of Care Note  Patient: Terry Gray  Procedure(s) Performed: Procedure(s): COLONOSCOPY WITH PROPOFOL (N/A) ESOPHAGOGASTRODUODENOSCOPY (EGD) WITH PROPOFOL (N/A)  Patient Location: PACU  Anesthesia Type:MAC  Level of Consciousness: sedated  Airway & Oxygen Therapy: Patient Spontanous Breathing and Patient connected to nasal cannula oxygen  Post-op Assessment: Report given to PACU RN and Post -op Vital signs reviewed and stable  Post vital signs: Reviewed and stable  Complications: No apparent anesthesia complications

## 2013-11-29 ENCOUNTER — Encounter (HOSPITAL_COMMUNITY): Payer: Self-pay | Admitting: Gastroenterology

## 2013-11-29 NOTE — Anesthesia Postprocedure Evaluation (Signed)
  Anesthesia Post-op Note  Patient: Terry Gray  Procedure(s) Performed: Procedure(s) (LRB): COLONOSCOPY WITH PROPOFOL (N/A) ESOPHAGOGASTRODUODENOSCOPY (EGD) WITH PROPOFOL (N/A)  Patient Location: PACU  Anesthesia Type: MAC  Level of Consciousness: awake and alert   Airway and Oxygen Therapy: Patient Spontanous Breathing  Post-op Pain: mild  Post-op Assessment: Post-op Vital signs reviewed, Patient's Cardiovascular Status Stable, Respiratory Function Stable, Patent Airway and No signs of Nausea or vomiting  Last Vitals:  Filed Vitals:   11/28/13 1423  BP: 141/65  Pulse: 69  Temp:   Resp: 15    Post-op Vital Signs: stable   Complications: No apparent anesthesia complications

## 2015-05-11 IMAGING — US US ABDOMEN COMPLETE
1 series · 14 of 25 positions shown · non-contrast
Comparison: February 12, 2007.

CLINICAL DATA: Abdominal pain

ABDOMINAL ULTRASOUND COMPLETE

[Series 1: us abdomen complete · 0.26mm/px · 14 of 61 slices shown]
[im 1/61]
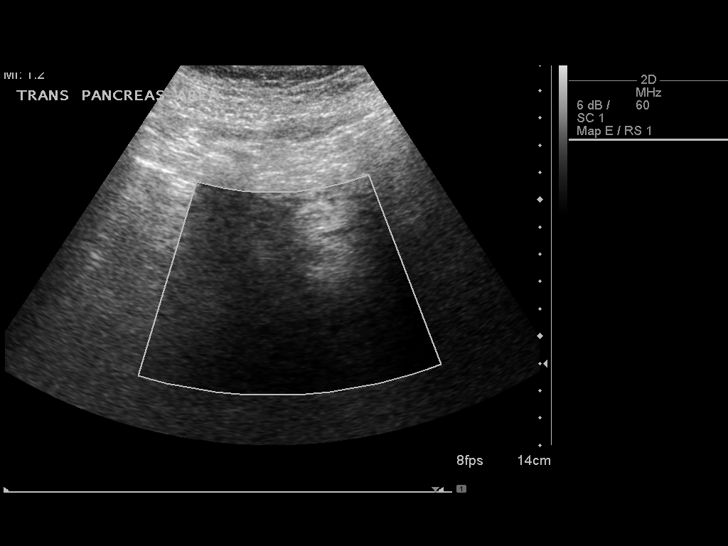
[im 6/61]
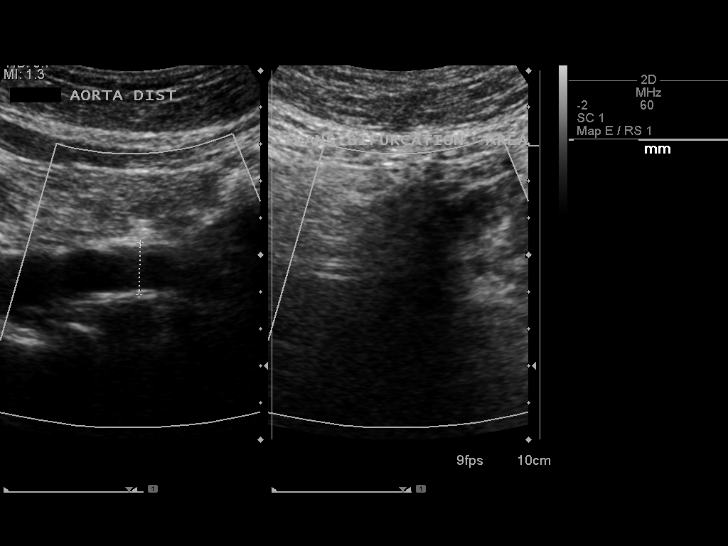
[im 11/61]
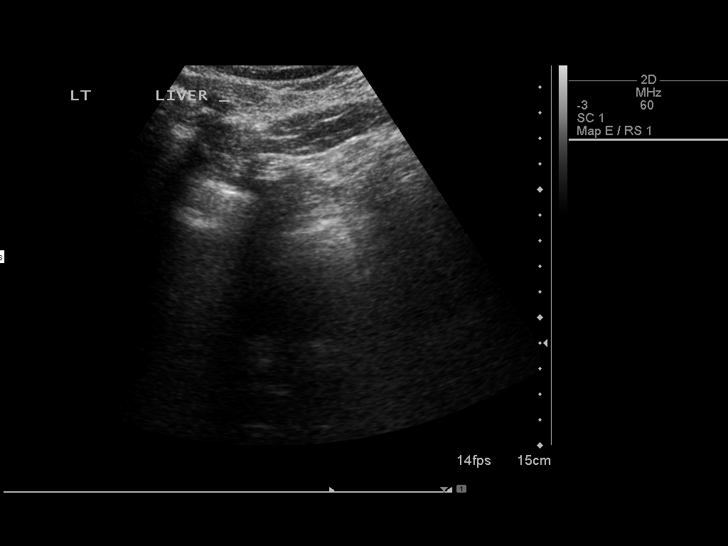
[im 16/61]
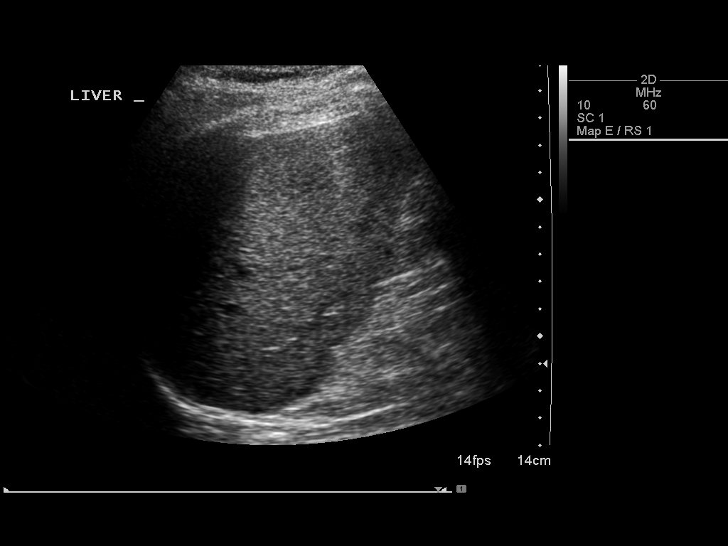
[im 21/61]
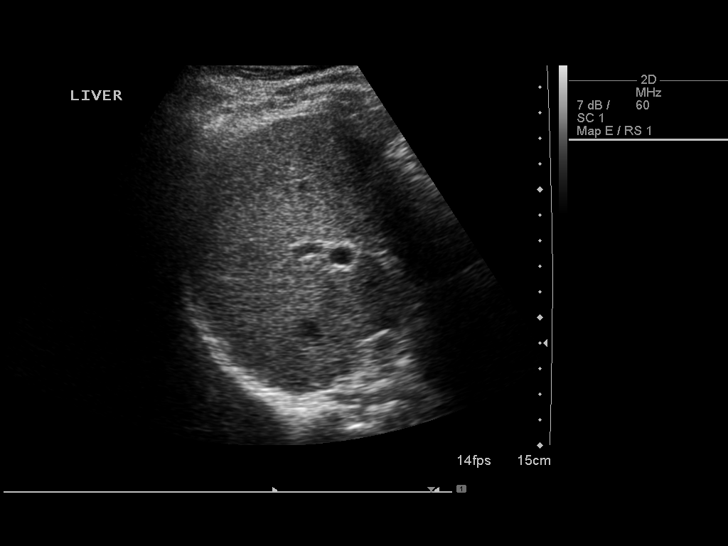
[im 23/61]
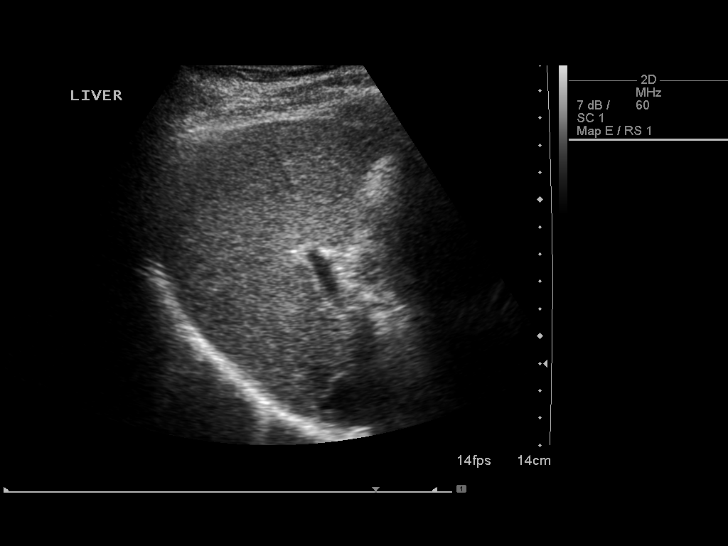
[im 28/61]
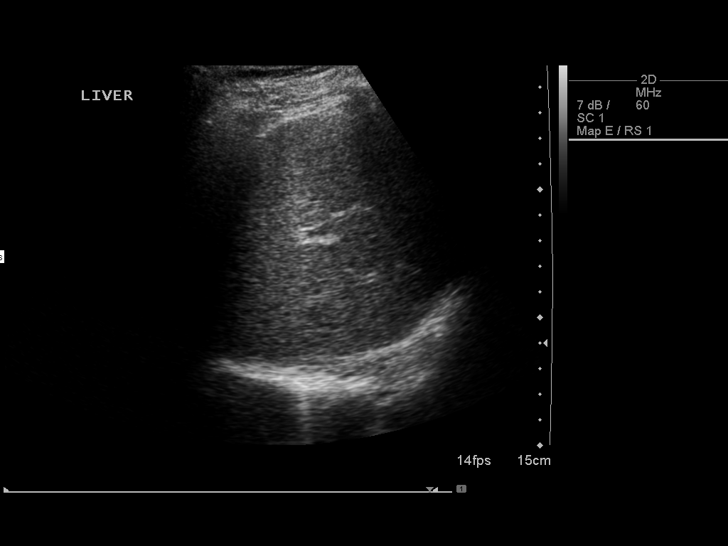
[im 33/61]
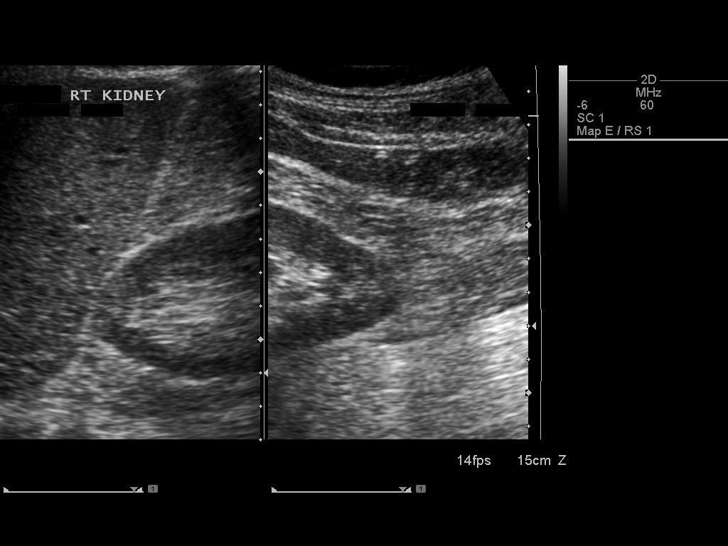
[im 38/61]
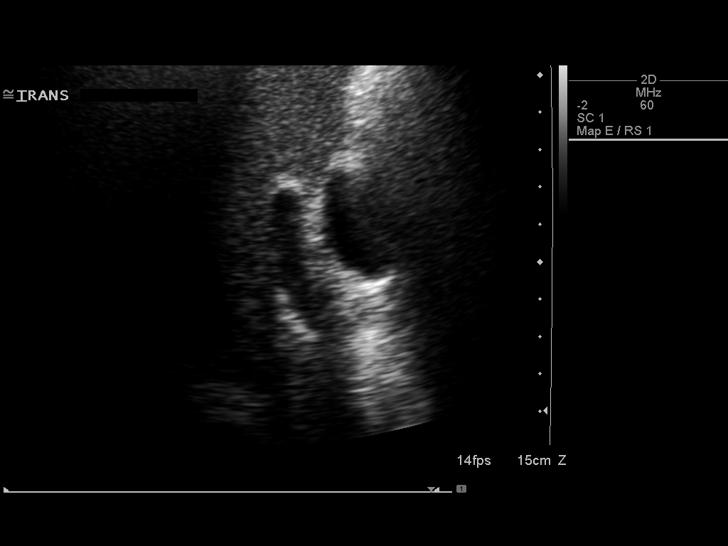
[im 41/61]
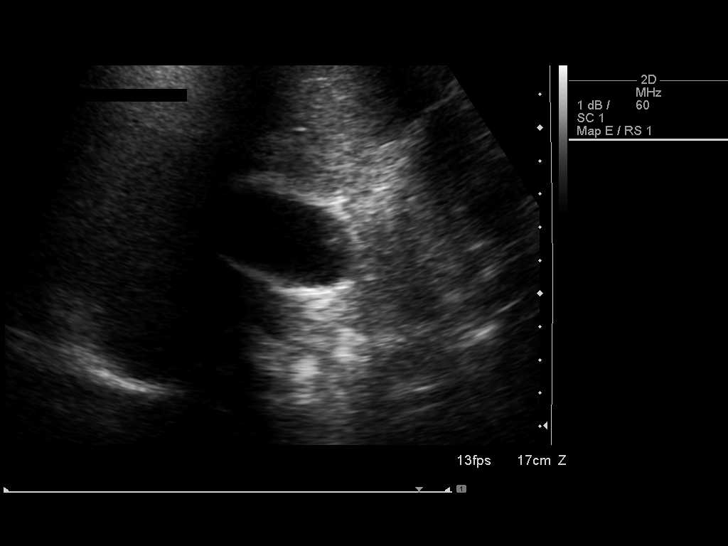
[im 46/61]
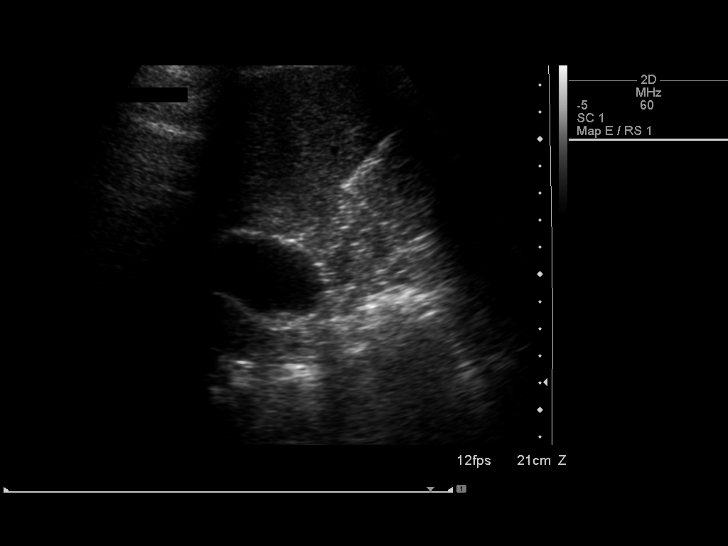
[im 51/61]
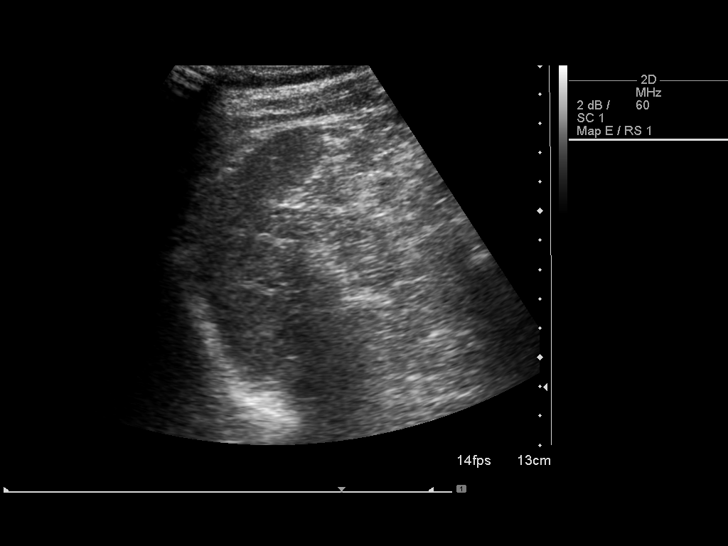
[im 56/61]
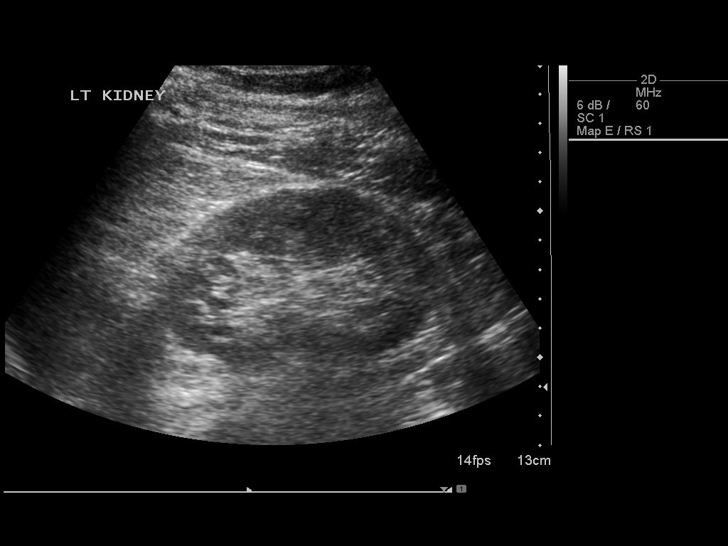
[im 61/61]
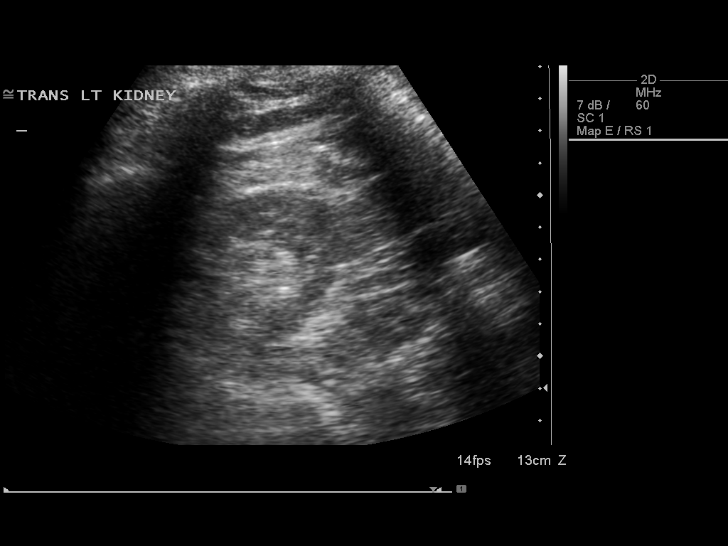

[14 of 25 positions shown; findings below may reference images not displayed]

FINDINGS: Gallbladder:  No gallstones, gallbladder wall thickening, or
pericholecystic fluid.

Common Bile Duct:  Measures 1.3 mm which is within normal limits in
caliber.

Liver: No focal mass lesion identified.  Within normal limits in
parenchymal echogenicity.

IVC:  Appears normal.

Pancreas:  Not well visualized due to overlying bowel gas.

Spleen:  Within normal limits in size and echotexture.

Right kidney:  Measures 10 cm in length. Normal in size and
parenchymal echogenicity.  No evidence of mass or hydronephrosis.

Left kidney:  Measures 10 cm in length. Normal in size and
parenchymal echogenicity.  No evidence of mass or hydronephrosis.

Abdominal Aorta:  No aneurysm identified.
IMPRESSION: Negative abdominal ultrasound.

## 2015-05-21 DIAGNOSIS — I10 Essential (primary) hypertension: Secondary | ICD-10-CM | POA: Diagnosis not present

## 2015-05-21 DIAGNOSIS — K219 Gastro-esophageal reflux disease without esophagitis: Secondary | ICD-10-CM | POA: Diagnosis not present

## 2015-05-21 DIAGNOSIS — E782 Mixed hyperlipidemia: Secondary | ICD-10-CM | POA: Diagnosis not present

## 2015-05-21 DIAGNOSIS — D696 Thrombocytopenia, unspecified: Secondary | ICD-10-CM | POA: Diagnosis not present

## 2015-05-21 DIAGNOSIS — G459 Transient cerebral ischemic attack, unspecified: Secondary | ICD-10-CM | POA: Diagnosis not present

## 2015-07-06 DIAGNOSIS — H40033 Anatomical narrow angle, bilateral: Secondary | ICD-10-CM | POA: Diagnosis not present

## 2015-07-06 DIAGNOSIS — H2513 Age-related nuclear cataract, bilateral: Secondary | ICD-10-CM | POA: Diagnosis not present

## 2015-10-26 DIAGNOSIS — H2512 Age-related nuclear cataract, left eye: Secondary | ICD-10-CM | POA: Diagnosis not present

## 2015-10-26 DIAGNOSIS — H2513 Age-related nuclear cataract, bilateral: Secondary | ICD-10-CM | POA: Diagnosis not present

## 2015-11-23 DIAGNOSIS — I10 Essential (primary) hypertension: Secondary | ICD-10-CM | POA: Diagnosis not present

## 2015-11-23 DIAGNOSIS — Z0000 Encounter for general adult medical examination without abnormal findings: Secondary | ICD-10-CM | POA: Diagnosis not present

## 2015-11-23 DIAGNOSIS — K219 Gastro-esophageal reflux disease without esophagitis: Secondary | ICD-10-CM | POA: Diagnosis not present

## 2015-11-23 DIAGNOSIS — J309 Allergic rhinitis, unspecified: Secondary | ICD-10-CM | POA: Diagnosis not present

## 2015-11-23 DIAGNOSIS — Z23 Encounter for immunization: Secondary | ICD-10-CM | POA: Diagnosis not present

## 2015-11-23 DIAGNOSIS — G459 Transient cerebral ischemic attack, unspecified: Secondary | ICD-10-CM | POA: Diagnosis not present

## 2015-11-23 DIAGNOSIS — M519 Unspecified thoracic, thoracolumbar and lumbosacral intervertebral disc disorder: Secondary | ICD-10-CM | POA: Diagnosis not present

## 2015-11-23 DIAGNOSIS — E782 Mixed hyperlipidemia: Secondary | ICD-10-CM | POA: Diagnosis not present

## 2015-11-23 DIAGNOSIS — Z1389 Encounter for screening for other disorder: Secondary | ICD-10-CM | POA: Diagnosis not present

## 2015-11-23 DIAGNOSIS — D696 Thrombocytopenia, unspecified: Secondary | ICD-10-CM | POA: Diagnosis not present

## 2015-11-23 DIAGNOSIS — N400 Enlarged prostate without lower urinary tract symptoms: Secondary | ICD-10-CM | POA: Diagnosis not present

## 2015-12-08 DIAGNOSIS — H2513 Age-related nuclear cataract, bilateral: Secondary | ICD-10-CM | POA: Diagnosis not present

## 2015-12-08 DIAGNOSIS — Z961 Presence of intraocular lens: Secondary | ICD-10-CM | POA: Diagnosis not present

## 2015-12-08 DIAGNOSIS — H2512 Age-related nuclear cataract, left eye: Secondary | ICD-10-CM | POA: Diagnosis not present

## 2016-01-05 DIAGNOSIS — H04123 Dry eye syndrome of bilateral lacrimal glands: Secondary | ICD-10-CM | POA: Diagnosis not present

## 2016-01-26 IMAGING — CR DG CHEST 1V PORT
1 series · 1 of 1 positions shown · non-contrast
Comparison: Chest radiograph performed 01/23/2012

CLINICAL DATA: Shortness of breath, chest pain, nausea and
weakness. Vomiting.

EXAM:
PORTABLE CHEST - 1 VIEW

[AP]
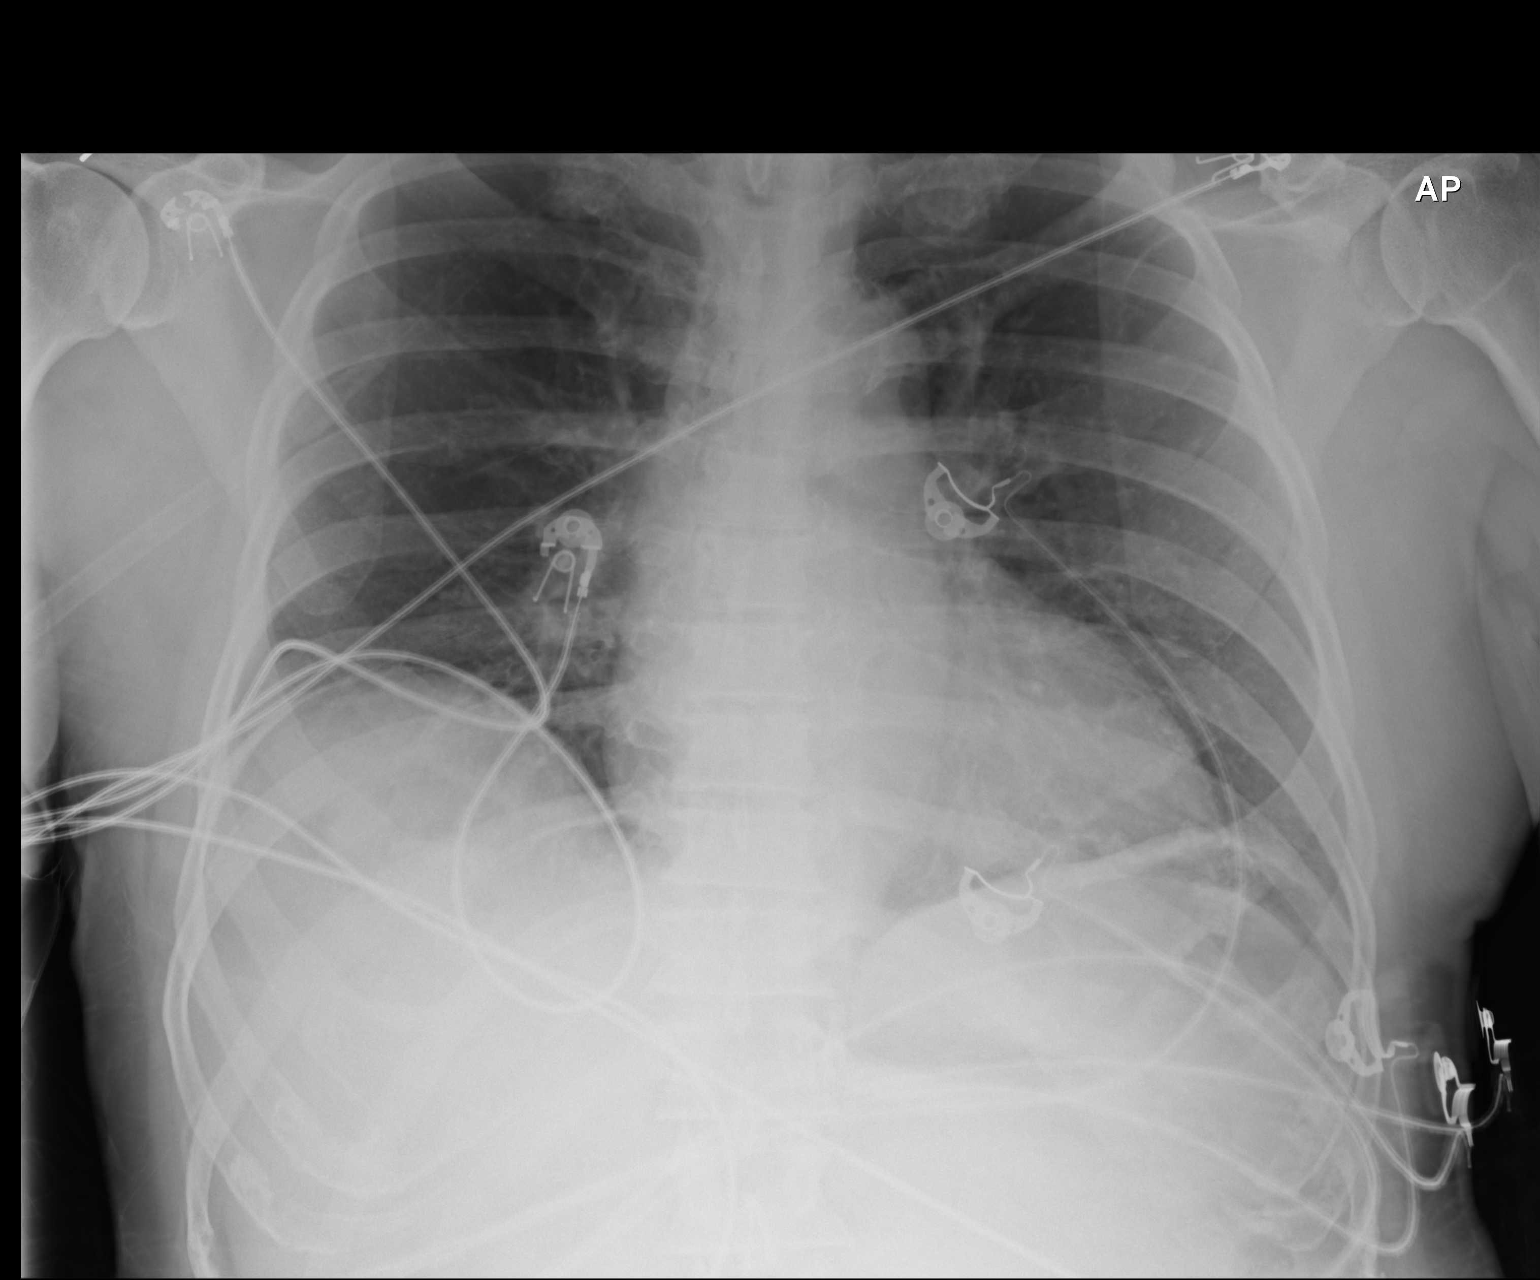

[1 of 1 positions shown; findings below may reference images not displayed]

FINDINGS: The lungs are well-aerated and clear. There is no evidence of focal
opacification, pleural effusion or pneumothorax.

The cardiomediastinal silhouette is within normal limits. No acute
osseous abnormalities are seen.
IMPRESSION: No acute cardiopulmonary process seen.

## 2016-01-26 IMAGING — CT CT HEAD W/O CM
2 series · 16 of 30 positions shown, 20 images · non-contrast
Comparison: 08/04/2010.

CLINICAL DATA: Nausea, vomiting and vertigo. Weakness. History of
stroke and hypertension.

EXAM:
CT HEAD WITHOUT CONTRAST
TECHNIQUE: Contiguous axial images were obtained from the base of the skull
through the vertex without intravenous contrast.

[Series 201: head w/o, idose (1) · axial · non-contrast · 0.44mm/px · z∈[+83,+213]mm · 13 of 32 slices shown, 17 images]
[im 3/32  brain]
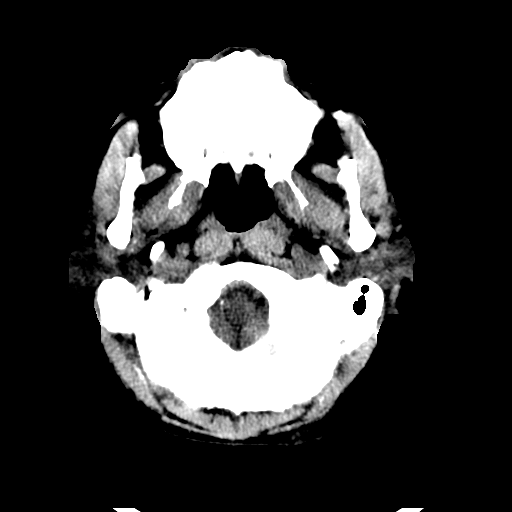
[im 3/32  bone]
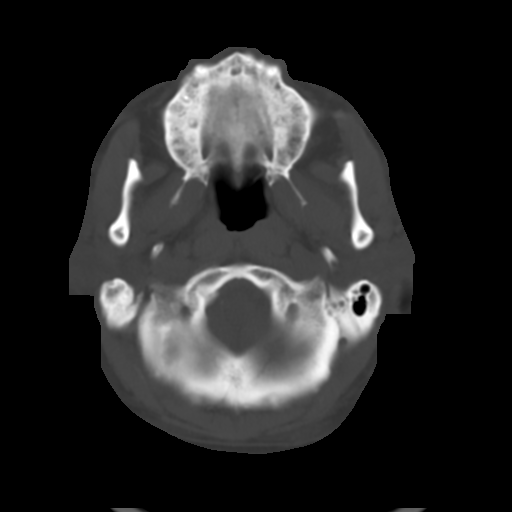
[im 5/32  brain]
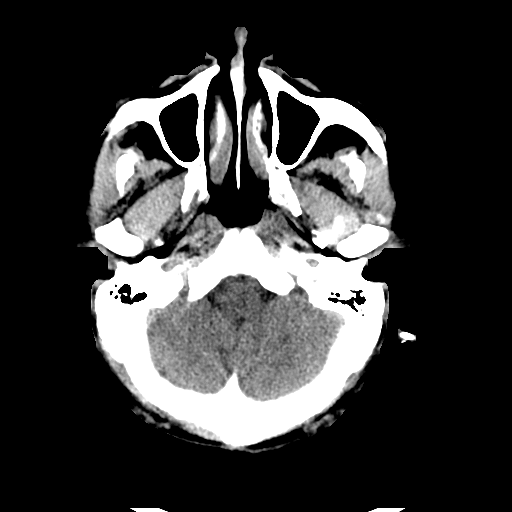
[im 7/32  brain]
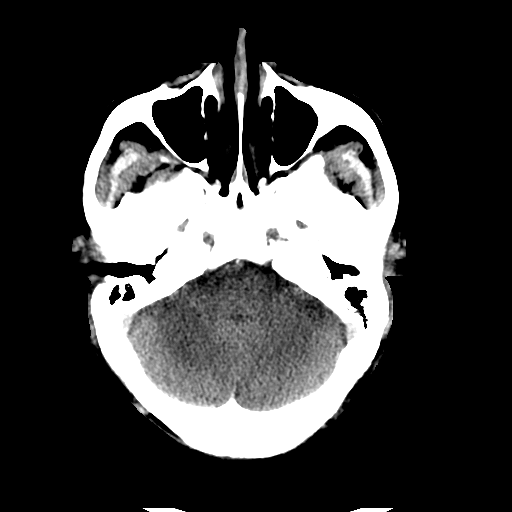
[im 9/32  brain]
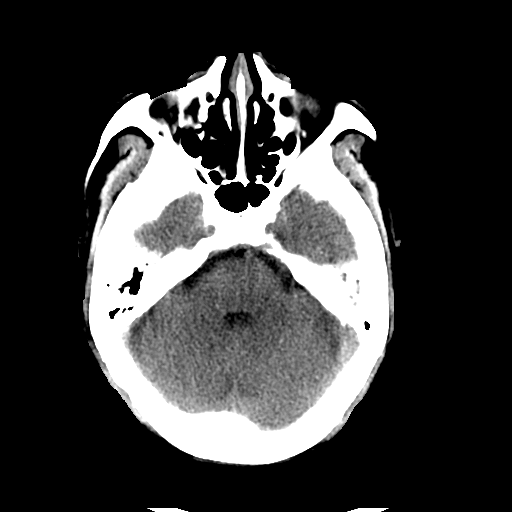
[im 12/32  brain]
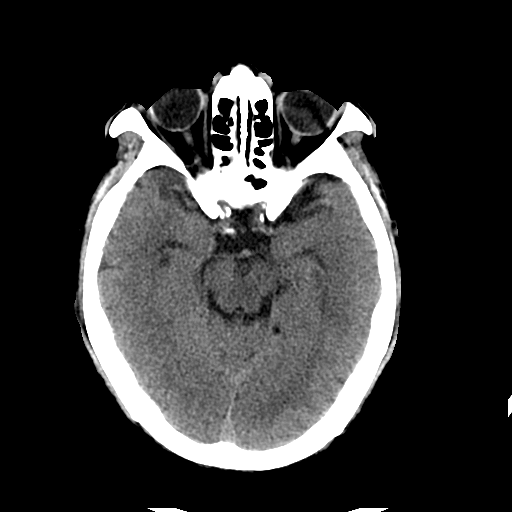
[im 12/32  bone]
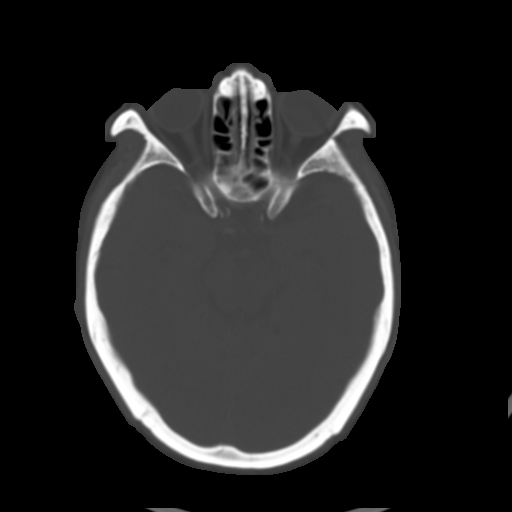
[im 14/32  brain]
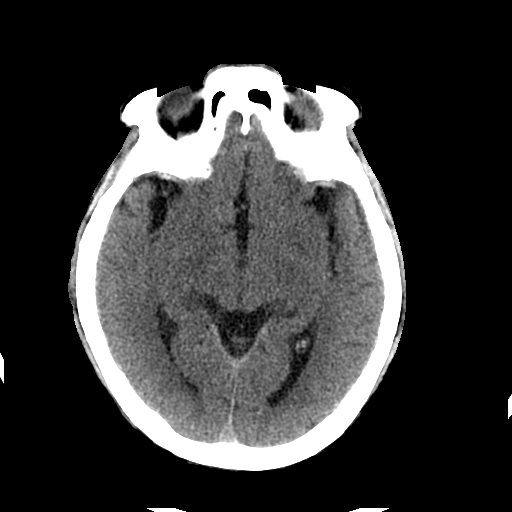
[im 16/32  brain]
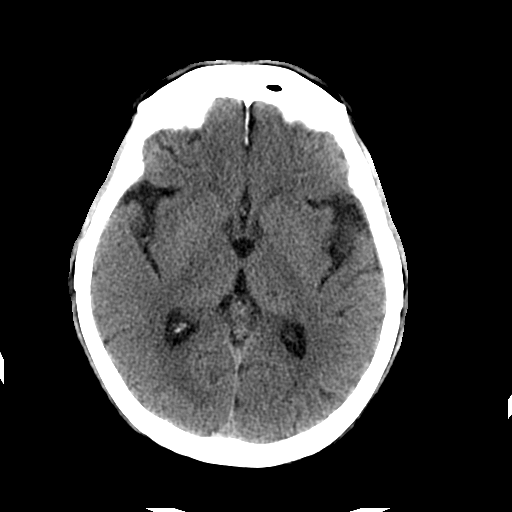
[im 18/32  brain]
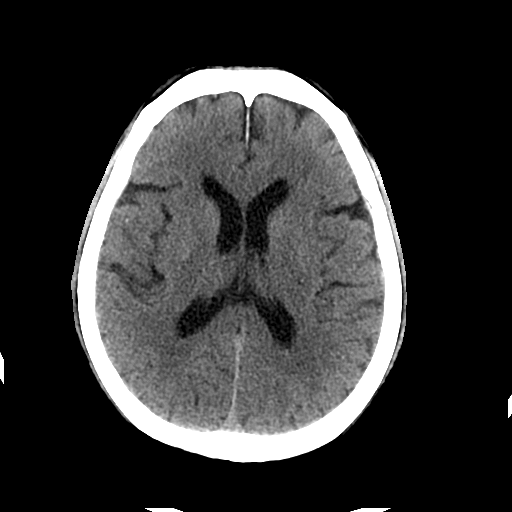
[im 20/32  brain]
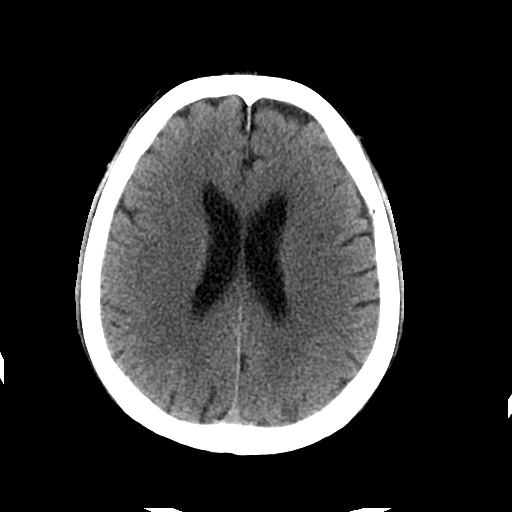
[im 20/32  bone]
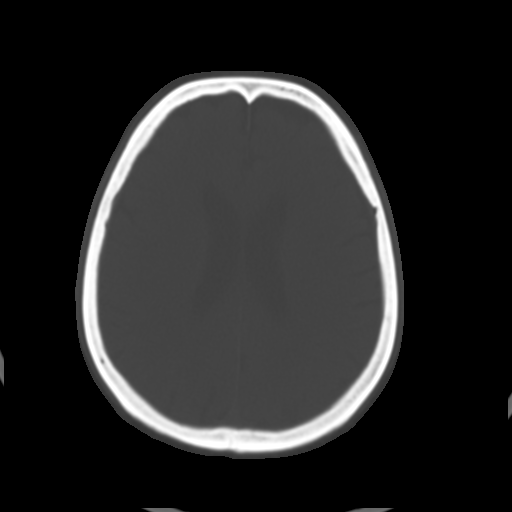
[im 23/32  brain]
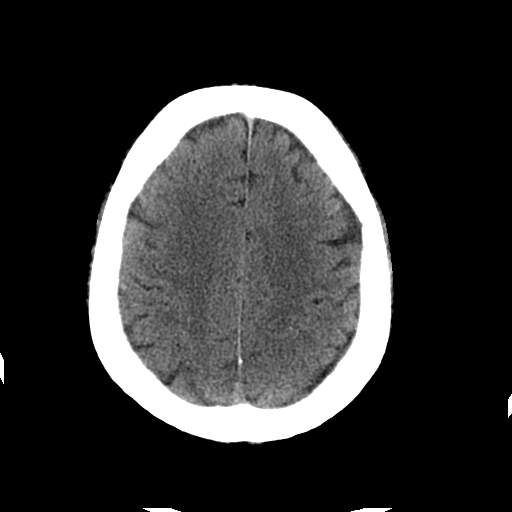
[im 25/32  brain]
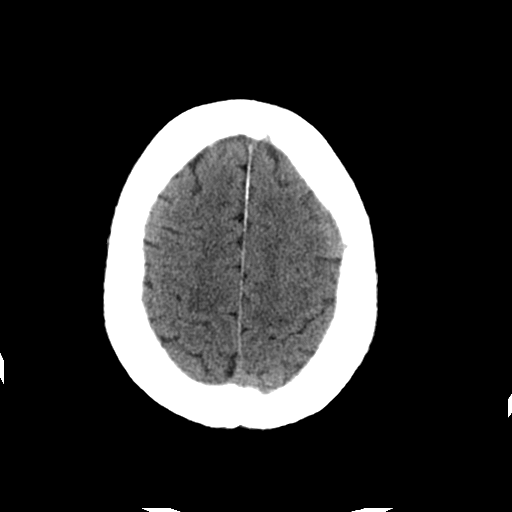
[im 27/32  brain]
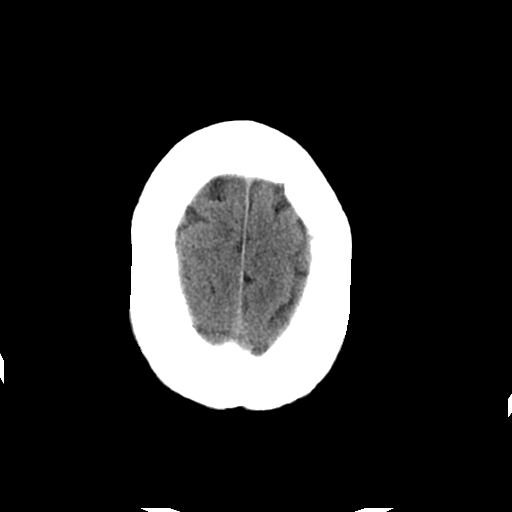
[im 29/32  brain]
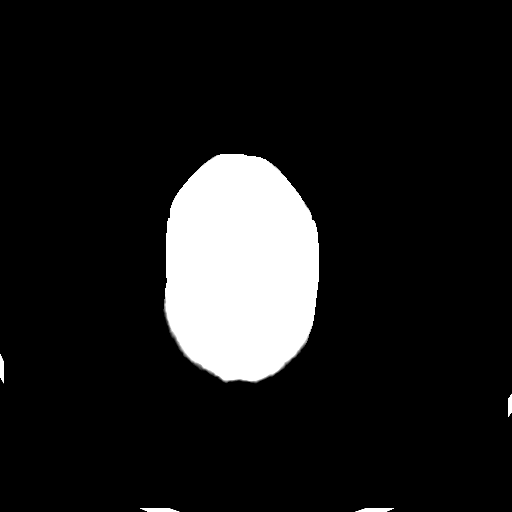
[im 29/32  bone]
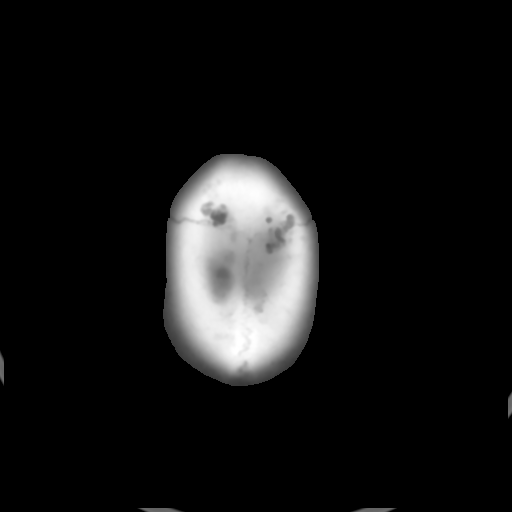

[Series 202: head w/o bone, idose (1) · axial · non-contrast · 0.44mm/px · z∈[+83,+128]mm · 3 of 32 slices shown]
[im 3/32  bone]
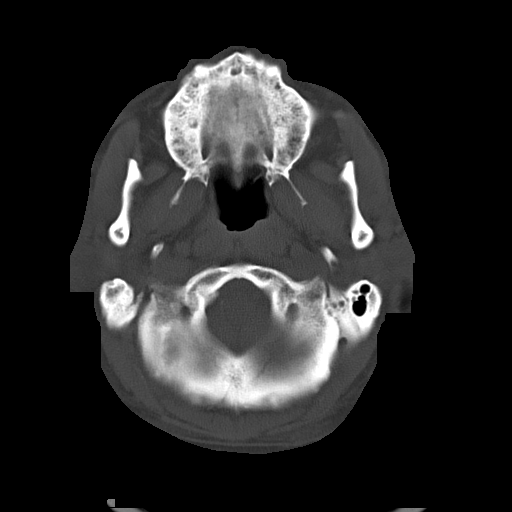
[im 7/32  bone]
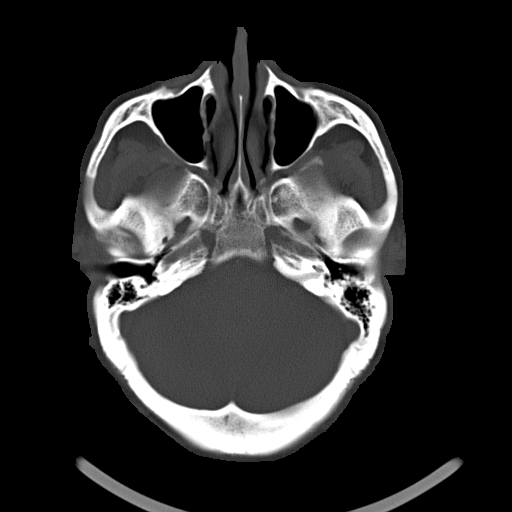
[im 12/32  bone]
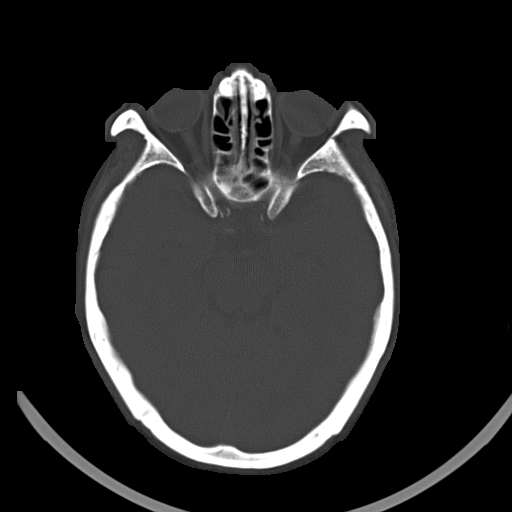

[16 of 30 positions shown; findings below may reference images not displayed]

FINDINGS: No mass lesion, mass effect, midline shift, hydrocephalus,
hemorrhage. No acute territorial cortical ischemia/infarct. Atrophy
and chronic ischemic white matter disease is present. Right lens
extraction. Scout images appear within normal limits. Mild ethmoid
mucosal thickening.
IMPRESSION: Atrophy and chronic ischemic white matter disease without acute
intracranial abnormality.

## 2016-03-11 DIAGNOSIS — H43393 Other vitreous opacities, bilateral: Secondary | ICD-10-CM | POA: Diagnosis not present

## 2016-05-11 DIAGNOSIS — D696 Thrombocytopenia, unspecified: Secondary | ICD-10-CM | POA: Diagnosis not present

## 2016-05-11 DIAGNOSIS — E782 Mixed hyperlipidemia: Secondary | ICD-10-CM | POA: Diagnosis not present

## 2016-05-11 DIAGNOSIS — N400 Enlarged prostate without lower urinary tract symptoms: Secondary | ICD-10-CM | POA: Diagnosis not present

## 2016-05-11 DIAGNOSIS — I10 Essential (primary) hypertension: Secondary | ICD-10-CM | POA: Diagnosis not present

## 2016-07-04 DIAGNOSIS — H43393 Other vitreous opacities, bilateral: Secondary | ICD-10-CM | POA: Diagnosis not present

## 2016-07-04 DIAGNOSIS — H40033 Anatomical narrow angle, bilateral: Secondary | ICD-10-CM | POA: Diagnosis not present

## 2016-11-14 DIAGNOSIS — R251 Tremor, unspecified: Secondary | ICD-10-CM | POA: Diagnosis not present

## 2016-11-29 DIAGNOSIS — E782 Mixed hyperlipidemia: Secondary | ICD-10-CM | POA: Diagnosis not present

## 2016-11-29 DIAGNOSIS — I10 Essential (primary) hypertension: Secondary | ICD-10-CM | POA: Diagnosis not present

## 2016-11-29 DIAGNOSIS — D696 Thrombocytopenia, unspecified: Secondary | ICD-10-CM | POA: Diagnosis not present

## 2016-11-29 DIAGNOSIS — G459 Transient cerebral ischemic attack, unspecified: Secondary | ICD-10-CM | POA: Diagnosis not present

## 2016-11-29 DIAGNOSIS — N400 Enlarged prostate without lower urinary tract symptoms: Secondary | ICD-10-CM | POA: Diagnosis not present

## 2016-11-29 DIAGNOSIS — J309 Allergic rhinitis, unspecified: Secondary | ICD-10-CM | POA: Diagnosis not present

## 2016-11-29 DIAGNOSIS — Z23 Encounter for immunization: Secondary | ICD-10-CM | POA: Diagnosis not present

## 2016-11-29 DIAGNOSIS — M519 Unspecified thoracic, thoracolumbar and lumbosacral intervertebral disc disorder: Secondary | ICD-10-CM | POA: Diagnosis not present

## 2016-11-29 DIAGNOSIS — Z1389 Encounter for screening for other disorder: Secondary | ICD-10-CM | POA: Diagnosis not present

## 2016-11-29 DIAGNOSIS — Z0000 Encounter for general adult medical examination without abnormal findings: Secondary | ICD-10-CM | POA: Diagnosis not present

## 2017-01-31 DIAGNOSIS — N400 Enlarged prostate without lower urinary tract symptoms: Secondary | ICD-10-CM | POA: Diagnosis not present

## 2017-01-31 DIAGNOSIS — I10 Essential (primary) hypertension: Secondary | ICD-10-CM | POA: Diagnosis not present

## 2017-01-31 DIAGNOSIS — G459 Transient cerebral ischemic attack, unspecified: Secondary | ICD-10-CM | POA: Diagnosis not present

## 2017-01-31 DIAGNOSIS — E782 Mixed hyperlipidemia: Secondary | ICD-10-CM | POA: Diagnosis not present

## 2017-02-06 DIAGNOSIS — I10 Essential (primary) hypertension: Secondary | ICD-10-CM | POA: Diagnosis not present

## 2017-02-06 DIAGNOSIS — E782 Mixed hyperlipidemia: Secondary | ICD-10-CM | POA: Diagnosis not present

## 2017-02-06 DIAGNOSIS — G459 Transient cerebral ischemic attack, unspecified: Secondary | ICD-10-CM | POA: Diagnosis not present

## 2017-02-06 DIAGNOSIS — D696 Thrombocytopenia, unspecified: Secondary | ICD-10-CM | POA: Diagnosis not present

## 2017-02-06 DIAGNOSIS — N400 Enlarged prostate without lower urinary tract symptoms: Secondary | ICD-10-CM | POA: Diagnosis not present

## 2017-05-26 DIAGNOSIS — M1990 Unspecified osteoarthritis, unspecified site: Secondary | ICD-10-CM | POA: Diagnosis not present

## 2017-05-26 DIAGNOSIS — G459 Transient cerebral ischemic attack, unspecified: Secondary | ICD-10-CM | POA: Diagnosis not present

## 2017-05-26 DIAGNOSIS — I10 Essential (primary) hypertension: Secondary | ICD-10-CM | POA: Diagnosis not present

## 2017-05-26 DIAGNOSIS — N400 Enlarged prostate without lower urinary tract symptoms: Secondary | ICD-10-CM | POA: Diagnosis not present

## 2017-05-26 DIAGNOSIS — E782 Mixed hyperlipidemia: Secondary | ICD-10-CM | POA: Diagnosis not present

## 2017-06-07 DIAGNOSIS — I10 Essential (primary) hypertension: Secondary | ICD-10-CM | POA: Diagnosis not present

## 2017-06-07 DIAGNOSIS — M1990 Unspecified osteoarthritis, unspecified site: Secondary | ICD-10-CM | POA: Diagnosis not present

## 2017-06-07 DIAGNOSIS — M519 Unspecified thoracic, thoracolumbar and lumbosacral intervertebral disc disorder: Secondary | ICD-10-CM | POA: Diagnosis not present

## 2017-06-07 DIAGNOSIS — Z1389 Encounter for screening for other disorder: Secondary | ICD-10-CM | POA: Diagnosis not present

## 2017-06-07 DIAGNOSIS — G459 Transient cerebral ischemic attack, unspecified: Secondary | ICD-10-CM | POA: Diagnosis not present

## 2017-06-07 DIAGNOSIS — E782 Mixed hyperlipidemia: Secondary | ICD-10-CM | POA: Diagnosis not present

## 2017-06-07 DIAGNOSIS — N400 Enlarged prostate without lower urinary tract symptoms: Secondary | ICD-10-CM | POA: Diagnosis not present

## 2017-06-07 DIAGNOSIS — D696 Thrombocytopenia, unspecified: Secondary | ICD-10-CM | POA: Diagnosis not present

## 2017-07-27 DIAGNOSIS — E782 Mixed hyperlipidemia: Secondary | ICD-10-CM | POA: Diagnosis not present

## 2017-07-27 DIAGNOSIS — I10 Essential (primary) hypertension: Secondary | ICD-10-CM | POA: Diagnosis not present

## 2017-07-27 DIAGNOSIS — G459 Transient cerebral ischemic attack, unspecified: Secondary | ICD-10-CM | POA: Diagnosis not present

## 2017-07-27 DIAGNOSIS — N400 Enlarged prostate without lower urinary tract symptoms: Secondary | ICD-10-CM | POA: Diagnosis not present

## 2017-07-27 DIAGNOSIS — M1990 Unspecified osteoarthritis, unspecified site: Secondary | ICD-10-CM | POA: Diagnosis not present

## 2017-08-08 DIAGNOSIS — J302 Other seasonal allergic rhinitis: Secondary | ICD-10-CM | POA: Diagnosis not present

## 2017-08-08 DIAGNOSIS — R05 Cough: Secondary | ICD-10-CM | POA: Diagnosis not present

## 2017-08-08 DIAGNOSIS — J209 Acute bronchitis, unspecified: Secondary | ICD-10-CM | POA: Diagnosis not present

## 2017-08-30 DIAGNOSIS — M1990 Unspecified osteoarthritis, unspecified site: Secondary | ICD-10-CM | POA: Diagnosis not present

## 2017-08-30 DIAGNOSIS — N400 Enlarged prostate without lower urinary tract symptoms: Secondary | ICD-10-CM | POA: Diagnosis not present

## 2017-08-30 DIAGNOSIS — G459 Transient cerebral ischemic attack, unspecified: Secondary | ICD-10-CM | POA: Diagnosis not present

## 2017-08-30 DIAGNOSIS — I10 Essential (primary) hypertension: Secondary | ICD-10-CM | POA: Diagnosis not present

## 2017-08-30 DIAGNOSIS — E782 Mixed hyperlipidemia: Secondary | ICD-10-CM | POA: Diagnosis not present

## 2017-10-24 DIAGNOSIS — H40033 Anatomical narrow angle, bilateral: Secondary | ICD-10-CM | POA: Diagnosis not present

## 2017-10-24 DIAGNOSIS — H43393 Other vitreous opacities, bilateral: Secondary | ICD-10-CM | POA: Diagnosis not present

## 2018-01-16 DIAGNOSIS — G459 Transient cerebral ischemic attack, unspecified: Secondary | ICD-10-CM | POA: Diagnosis not present

## 2018-01-16 DIAGNOSIS — Z1389 Encounter for screening for other disorder: Secondary | ICD-10-CM | POA: Diagnosis not present

## 2018-01-16 DIAGNOSIS — E782 Mixed hyperlipidemia: Secondary | ICD-10-CM | POA: Diagnosis not present

## 2018-01-16 DIAGNOSIS — Z0000 Encounter for general adult medical examination without abnormal findings: Secondary | ICD-10-CM | POA: Diagnosis not present

## 2018-01-16 DIAGNOSIS — D696 Thrombocytopenia, unspecified: Secondary | ICD-10-CM | POA: Diagnosis not present

## 2018-01-16 DIAGNOSIS — M519 Unspecified thoracic, thoracolumbar and lumbosacral intervertebral disc disorder: Secondary | ICD-10-CM | POA: Diagnosis not present

## 2018-01-16 DIAGNOSIS — J309 Allergic rhinitis, unspecified: Secondary | ICD-10-CM | POA: Diagnosis not present

## 2018-01-16 DIAGNOSIS — N400 Enlarged prostate without lower urinary tract symptoms: Secondary | ICD-10-CM | POA: Diagnosis not present

## 2018-01-16 DIAGNOSIS — I10 Essential (primary) hypertension: Secondary | ICD-10-CM | POA: Diagnosis not present

## 2018-02-13 DIAGNOSIS — J209 Acute bronchitis, unspecified: Secondary | ICD-10-CM | POA: Diagnosis not present

## 2018-04-03 DIAGNOSIS — R51 Headache: Secondary | ICD-10-CM | POA: Diagnosis not present

## 2018-04-03 DIAGNOSIS — I10 Essential (primary) hypertension: Secondary | ICD-10-CM | POA: Diagnosis not present

## 2018-04-03 DIAGNOSIS — M542 Cervicalgia: Secondary | ICD-10-CM | POA: Diagnosis not present

## 2018-07-25 DIAGNOSIS — D696 Thrombocytopenia, unspecified: Secondary | ICD-10-CM | POA: Diagnosis not present

## 2018-07-25 DIAGNOSIS — N400 Enlarged prostate without lower urinary tract symptoms: Secondary | ICD-10-CM | POA: Diagnosis not present

## 2018-07-25 DIAGNOSIS — I10 Essential (primary) hypertension: Secondary | ICD-10-CM | POA: Diagnosis not present

## 2018-07-25 DIAGNOSIS — M519 Unspecified thoracic, thoracolumbar and lumbosacral intervertebral disc disorder: Secondary | ICD-10-CM | POA: Diagnosis not present

## 2018-07-25 DIAGNOSIS — E782 Mixed hyperlipidemia: Secondary | ICD-10-CM | POA: Diagnosis not present

## 2018-09-04 DIAGNOSIS — I10 Essential (primary) hypertension: Secondary | ICD-10-CM | POA: Diagnosis not present

## 2018-09-04 DIAGNOSIS — N400 Enlarged prostate without lower urinary tract symptoms: Secondary | ICD-10-CM | POA: Diagnosis not present

## 2018-09-04 DIAGNOSIS — M1990 Unspecified osteoarthritis, unspecified site: Secondary | ICD-10-CM | POA: Diagnosis not present

## 2018-09-04 DIAGNOSIS — G459 Transient cerebral ischemic attack, unspecified: Secondary | ICD-10-CM | POA: Diagnosis not present

## 2018-09-04 DIAGNOSIS — E782 Mixed hyperlipidemia: Secondary | ICD-10-CM | POA: Diagnosis not present

## 2018-09-20 DIAGNOSIS — I10 Essential (primary) hypertension: Secondary | ICD-10-CM | POA: Diagnosis not present

## 2018-09-20 DIAGNOSIS — M1990 Unspecified osteoarthritis, unspecified site: Secondary | ICD-10-CM | POA: Diagnosis not present

## 2018-09-20 DIAGNOSIS — E782 Mixed hyperlipidemia: Secondary | ICD-10-CM | POA: Diagnosis not present

## 2018-09-20 DIAGNOSIS — G459 Transient cerebral ischemic attack, unspecified: Secondary | ICD-10-CM | POA: Diagnosis not present

## 2018-09-20 DIAGNOSIS — N400 Enlarged prostate without lower urinary tract symptoms: Secondary | ICD-10-CM | POA: Diagnosis not present

## 2018-11-01 DIAGNOSIS — E782 Mixed hyperlipidemia: Secondary | ICD-10-CM | POA: Diagnosis not present

## 2018-11-01 DIAGNOSIS — I10 Essential (primary) hypertension: Secondary | ICD-10-CM | POA: Diagnosis not present

## 2018-11-01 DIAGNOSIS — M1990 Unspecified osteoarthritis, unspecified site: Secondary | ICD-10-CM | POA: Diagnosis not present

## 2018-11-01 DIAGNOSIS — G459 Transient cerebral ischemic attack, unspecified: Secondary | ICD-10-CM | POA: Diagnosis not present

## 2018-11-01 DIAGNOSIS — N400 Enlarged prostate without lower urinary tract symptoms: Secondary | ICD-10-CM | POA: Diagnosis not present

## 2018-12-03 DIAGNOSIS — E782 Mixed hyperlipidemia: Secondary | ICD-10-CM | POA: Diagnosis not present

## 2018-12-03 DIAGNOSIS — G459 Transient cerebral ischemic attack, unspecified: Secondary | ICD-10-CM | POA: Diagnosis not present

## 2018-12-03 DIAGNOSIS — I10 Essential (primary) hypertension: Secondary | ICD-10-CM | POA: Diagnosis not present

## 2018-12-03 DIAGNOSIS — M1990 Unspecified osteoarthritis, unspecified site: Secondary | ICD-10-CM | POA: Diagnosis not present

## 2018-12-03 DIAGNOSIS — N400 Enlarged prostate without lower urinary tract symptoms: Secondary | ICD-10-CM | POA: Diagnosis not present

## 2019-01-01 DIAGNOSIS — N400 Enlarged prostate without lower urinary tract symptoms: Secondary | ICD-10-CM | POA: Diagnosis not present

## 2019-01-01 DIAGNOSIS — M1990 Unspecified osteoarthritis, unspecified site: Secondary | ICD-10-CM | POA: Diagnosis not present

## 2019-01-01 DIAGNOSIS — G459 Transient cerebral ischemic attack, unspecified: Secondary | ICD-10-CM | POA: Diagnosis not present

## 2019-01-01 DIAGNOSIS — E782 Mixed hyperlipidemia: Secondary | ICD-10-CM | POA: Diagnosis not present

## 2019-01-01 DIAGNOSIS — I10 Essential (primary) hypertension: Secondary | ICD-10-CM | POA: Diagnosis not present

## 2019-01-15 DIAGNOSIS — H40033 Anatomical narrow angle, bilateral: Secondary | ICD-10-CM | POA: Diagnosis not present

## 2019-01-15 DIAGNOSIS — H1013 Acute atopic conjunctivitis, bilateral: Secondary | ICD-10-CM | POA: Diagnosis not present

## 2019-01-23 DIAGNOSIS — I10 Essential (primary) hypertension: Secondary | ICD-10-CM | POA: Diagnosis not present

## 2019-01-23 DIAGNOSIS — M1990 Unspecified osteoarthritis, unspecified site: Secondary | ICD-10-CM | POA: Diagnosis not present

## 2019-01-23 DIAGNOSIS — N400 Enlarged prostate without lower urinary tract symptoms: Secondary | ICD-10-CM | POA: Diagnosis not present

## 2019-01-23 DIAGNOSIS — E782 Mixed hyperlipidemia: Secondary | ICD-10-CM | POA: Diagnosis not present

## 2019-01-23 DIAGNOSIS — G459 Transient cerebral ischemic attack, unspecified: Secondary | ICD-10-CM | POA: Diagnosis not present

## 2019-01-29 DIAGNOSIS — K5900 Constipation, unspecified: Secondary | ICD-10-CM | POA: Diagnosis not present

## 2019-01-29 DIAGNOSIS — I10 Essential (primary) hypertension: Secondary | ICD-10-CM | POA: Diagnosis not present

## 2019-01-29 DIAGNOSIS — N400 Enlarged prostate without lower urinary tract symptoms: Secondary | ICD-10-CM | POA: Diagnosis not present

## 2019-01-29 DIAGNOSIS — Z0000 Encounter for general adult medical examination without abnormal findings: Secondary | ICD-10-CM | POA: Diagnosis not present

## 2019-01-29 DIAGNOSIS — J309 Allergic rhinitis, unspecified: Secondary | ICD-10-CM | POA: Diagnosis not present

## 2019-01-29 DIAGNOSIS — G459 Transient cerebral ischemic attack, unspecified: Secondary | ICD-10-CM | POA: Diagnosis not present

## 2019-01-29 DIAGNOSIS — M519 Unspecified thoracic, thoracolumbar and lumbosacral intervertebral disc disorder: Secondary | ICD-10-CM | POA: Diagnosis not present

## 2019-01-29 DIAGNOSIS — D696 Thrombocytopenia, unspecified: Secondary | ICD-10-CM | POA: Diagnosis not present

## 2019-01-29 DIAGNOSIS — R269 Unspecified abnormalities of gait and mobility: Secondary | ICD-10-CM | POA: Diagnosis not present

## 2019-01-29 DIAGNOSIS — Z1389 Encounter for screening for other disorder: Secondary | ICD-10-CM | POA: Diagnosis not present

## 2019-01-29 DIAGNOSIS — E782 Mixed hyperlipidemia: Secondary | ICD-10-CM | POA: Diagnosis not present

## 2019-02-25 DIAGNOSIS — I10 Essential (primary) hypertension: Secondary | ICD-10-CM | POA: Diagnosis not present

## 2019-02-25 DIAGNOSIS — E782 Mixed hyperlipidemia: Secondary | ICD-10-CM | POA: Diagnosis not present

## 2019-02-25 DIAGNOSIS — M1990 Unspecified osteoarthritis, unspecified site: Secondary | ICD-10-CM | POA: Diagnosis not present

## 2019-02-25 DIAGNOSIS — G459 Transient cerebral ischemic attack, unspecified: Secondary | ICD-10-CM | POA: Diagnosis not present

## 2019-02-25 DIAGNOSIS — N400 Enlarged prostate without lower urinary tract symptoms: Secondary | ICD-10-CM | POA: Diagnosis not present

## 2019-02-25 DIAGNOSIS — R269 Unspecified abnormalities of gait and mobility: Secondary | ICD-10-CM | POA: Diagnosis not present

## 2019-03-22 DIAGNOSIS — M1990 Unspecified osteoarthritis, unspecified site: Secondary | ICD-10-CM | POA: Diagnosis not present

## 2019-03-22 DIAGNOSIS — G459 Transient cerebral ischemic attack, unspecified: Secondary | ICD-10-CM | POA: Diagnosis not present

## 2019-03-22 DIAGNOSIS — I10 Essential (primary) hypertension: Secondary | ICD-10-CM | POA: Diagnosis not present

## 2019-03-22 DIAGNOSIS — E782 Mixed hyperlipidemia: Secondary | ICD-10-CM | POA: Diagnosis not present

## 2019-03-22 DIAGNOSIS — N400 Enlarged prostate without lower urinary tract symptoms: Secondary | ICD-10-CM | POA: Diagnosis not present

## 2019-04-26 DIAGNOSIS — E782 Mixed hyperlipidemia: Secondary | ICD-10-CM | POA: Diagnosis not present

## 2019-04-26 DIAGNOSIS — N400 Enlarged prostate without lower urinary tract symptoms: Secondary | ICD-10-CM | POA: Diagnosis not present

## 2019-04-26 DIAGNOSIS — I10 Essential (primary) hypertension: Secondary | ICD-10-CM | POA: Diagnosis not present

## 2019-04-26 DIAGNOSIS — G459 Transient cerebral ischemic attack, unspecified: Secondary | ICD-10-CM | POA: Diagnosis not present

## 2019-04-26 DIAGNOSIS — M1990 Unspecified osteoarthritis, unspecified site: Secondary | ICD-10-CM | POA: Diagnosis not present

## 2019-05-30 DIAGNOSIS — E782 Mixed hyperlipidemia: Secondary | ICD-10-CM | POA: Diagnosis not present

## 2019-05-30 DIAGNOSIS — I10 Essential (primary) hypertension: Secondary | ICD-10-CM | POA: Diagnosis not present

## 2019-05-30 DIAGNOSIS — G459 Transient cerebral ischemic attack, unspecified: Secondary | ICD-10-CM | POA: Diagnosis not present

## 2019-05-30 DIAGNOSIS — M1990 Unspecified osteoarthritis, unspecified site: Secondary | ICD-10-CM | POA: Diagnosis not present

## 2019-05-30 DIAGNOSIS — N400 Enlarged prostate without lower urinary tract symptoms: Secondary | ICD-10-CM | POA: Diagnosis not present

## 2019-06-26 DIAGNOSIS — G459 Transient cerebral ischemic attack, unspecified: Secondary | ICD-10-CM | POA: Diagnosis not present

## 2019-06-26 DIAGNOSIS — E782 Mixed hyperlipidemia: Secondary | ICD-10-CM | POA: Diagnosis not present

## 2019-06-26 DIAGNOSIS — I10 Essential (primary) hypertension: Secondary | ICD-10-CM | POA: Diagnosis not present

## 2019-06-26 DIAGNOSIS — N400 Enlarged prostate without lower urinary tract symptoms: Secondary | ICD-10-CM | POA: Diagnosis not present

## 2019-06-26 DIAGNOSIS — M1990 Unspecified osteoarthritis, unspecified site: Secondary | ICD-10-CM | POA: Diagnosis not present

## 2019-07-26 DIAGNOSIS — G459 Transient cerebral ischemic attack, unspecified: Secondary | ICD-10-CM | POA: Diagnosis not present

## 2019-07-26 DIAGNOSIS — N400 Enlarged prostate without lower urinary tract symptoms: Secondary | ICD-10-CM | POA: Diagnosis not present

## 2019-07-26 DIAGNOSIS — M1990 Unspecified osteoarthritis, unspecified site: Secondary | ICD-10-CM | POA: Diagnosis not present

## 2019-07-26 DIAGNOSIS — E782 Mixed hyperlipidemia: Secondary | ICD-10-CM | POA: Diagnosis not present

## 2019-07-26 DIAGNOSIS — I10 Essential (primary) hypertension: Secondary | ICD-10-CM | POA: Diagnosis not present

## 2019-08-26 DIAGNOSIS — M1990 Unspecified osteoarthritis, unspecified site: Secondary | ICD-10-CM | POA: Diagnosis not present

## 2019-08-26 DIAGNOSIS — I10 Essential (primary) hypertension: Secondary | ICD-10-CM | POA: Diagnosis not present

## 2019-08-26 DIAGNOSIS — N400 Enlarged prostate without lower urinary tract symptoms: Secondary | ICD-10-CM | POA: Diagnosis not present

## 2019-08-26 DIAGNOSIS — G459 Transient cerebral ischemic attack, unspecified: Secondary | ICD-10-CM | POA: Diagnosis not present

## 2019-08-26 DIAGNOSIS — E782 Mixed hyperlipidemia: Secondary | ICD-10-CM | POA: Diagnosis not present

## 2019-08-27 DIAGNOSIS — G459 Transient cerebral ischemic attack, unspecified: Secondary | ICD-10-CM | POA: Diagnosis not present

## 2019-08-27 DIAGNOSIS — I10 Essential (primary) hypertension: Secondary | ICD-10-CM | POA: Diagnosis not present

## 2019-08-27 DIAGNOSIS — M1990 Unspecified osteoarthritis, unspecified site: Secondary | ICD-10-CM | POA: Diagnosis not present

## 2019-08-27 DIAGNOSIS — M519 Unspecified thoracic, thoracolumbar and lumbosacral intervertebral disc disorder: Secondary | ICD-10-CM | POA: Diagnosis not present

## 2019-08-27 DIAGNOSIS — E782 Mixed hyperlipidemia: Secondary | ICD-10-CM | POA: Diagnosis not present

## 2019-08-27 DIAGNOSIS — N400 Enlarged prostate without lower urinary tract symptoms: Secondary | ICD-10-CM | POA: Diagnosis not present

## 2019-08-27 DIAGNOSIS — J302 Other seasonal allergic rhinitis: Secondary | ICD-10-CM | POA: Diagnosis not present

## 2019-08-27 DIAGNOSIS — R269 Unspecified abnormalities of gait and mobility: Secondary | ICD-10-CM | POA: Diagnosis not present

## 2019-08-27 DIAGNOSIS — D696 Thrombocytopenia, unspecified: Secondary | ICD-10-CM | POA: Diagnosis not present

## 2019-09-19 DIAGNOSIS — M1990 Unspecified osteoarthritis, unspecified site: Secondary | ICD-10-CM | POA: Diagnosis not present

## 2019-09-19 DIAGNOSIS — E782 Mixed hyperlipidemia: Secondary | ICD-10-CM | POA: Diagnosis not present

## 2019-09-19 DIAGNOSIS — I10 Essential (primary) hypertension: Secondary | ICD-10-CM | POA: Diagnosis not present

## 2019-09-19 DIAGNOSIS — G459 Transient cerebral ischemic attack, unspecified: Secondary | ICD-10-CM | POA: Diagnosis not present

## 2019-09-19 DIAGNOSIS — N400 Enlarged prostate without lower urinary tract symptoms: Secondary | ICD-10-CM | POA: Diagnosis not present

## 2019-10-28 DIAGNOSIS — E782 Mixed hyperlipidemia: Secondary | ICD-10-CM | POA: Diagnosis not present

## 2019-10-28 DIAGNOSIS — M1990 Unspecified osteoarthritis, unspecified site: Secondary | ICD-10-CM | POA: Diagnosis not present

## 2019-10-28 DIAGNOSIS — I10 Essential (primary) hypertension: Secondary | ICD-10-CM | POA: Diagnosis not present

## 2019-10-28 DIAGNOSIS — G459 Transient cerebral ischemic attack, unspecified: Secondary | ICD-10-CM | POA: Diagnosis not present

## 2019-10-28 DIAGNOSIS — N400 Enlarged prostate without lower urinary tract symptoms: Secondary | ICD-10-CM | POA: Diagnosis not present

## 2019-11-27 DIAGNOSIS — M1990 Unspecified osteoarthritis, unspecified site: Secondary | ICD-10-CM | POA: Diagnosis not present

## 2019-11-27 DIAGNOSIS — N400 Enlarged prostate without lower urinary tract symptoms: Secondary | ICD-10-CM | POA: Diagnosis not present

## 2019-11-27 DIAGNOSIS — G459 Transient cerebral ischemic attack, unspecified: Secondary | ICD-10-CM | POA: Diagnosis not present

## 2019-11-27 DIAGNOSIS — I10 Essential (primary) hypertension: Secondary | ICD-10-CM | POA: Diagnosis not present

## 2019-11-27 DIAGNOSIS — E782 Mixed hyperlipidemia: Secondary | ICD-10-CM | POA: Diagnosis not present

## 2019-12-01 DIAGNOSIS — I10 Essential (primary) hypertension: Secondary | ICD-10-CM | POA: Diagnosis not present

## 2019-12-05 DIAGNOSIS — I10 Essential (primary) hypertension: Secondary | ICD-10-CM | POA: Diagnosis not present

## 2019-12-26 DIAGNOSIS — M1990 Unspecified osteoarthritis, unspecified site: Secondary | ICD-10-CM | POA: Diagnosis not present

## 2019-12-26 DIAGNOSIS — I10 Essential (primary) hypertension: Secondary | ICD-10-CM | POA: Diagnosis not present

## 2019-12-26 DIAGNOSIS — E782 Mixed hyperlipidemia: Secondary | ICD-10-CM | POA: Diagnosis not present

## 2019-12-26 DIAGNOSIS — N400 Enlarged prostate without lower urinary tract symptoms: Secondary | ICD-10-CM | POA: Diagnosis not present

## 2019-12-26 DIAGNOSIS — G459 Transient cerebral ischemic attack, unspecified: Secondary | ICD-10-CM | POA: Diagnosis not present

## 2020-01-03 DIAGNOSIS — I10 Essential (primary) hypertension: Secondary | ICD-10-CM | POA: Diagnosis not present

## 2020-01-14 DIAGNOSIS — H04123 Dry eye syndrome of bilateral lacrimal glands: Secondary | ICD-10-CM | POA: Diagnosis not present

## 2020-01-14 DIAGNOSIS — H40033 Anatomical narrow angle, bilateral: Secondary | ICD-10-CM | POA: Diagnosis not present

## 2020-01-26 DIAGNOSIS — G459 Transient cerebral ischemic attack, unspecified: Secondary | ICD-10-CM | POA: Diagnosis not present

## 2020-01-26 DIAGNOSIS — M1990 Unspecified osteoarthritis, unspecified site: Secondary | ICD-10-CM | POA: Diagnosis not present

## 2020-01-26 DIAGNOSIS — E782 Mixed hyperlipidemia: Secondary | ICD-10-CM | POA: Diagnosis not present

## 2020-01-26 DIAGNOSIS — I10 Essential (primary) hypertension: Secondary | ICD-10-CM | POA: Diagnosis not present

## 2020-01-26 DIAGNOSIS — N400 Enlarged prostate without lower urinary tract symptoms: Secondary | ICD-10-CM | POA: Diagnosis not present

## 2020-02-04 DIAGNOSIS — I10 Essential (primary) hypertension: Secondary | ICD-10-CM | POA: Diagnosis not present

## 2020-02-05 DIAGNOSIS — Z0000 Encounter for general adult medical examination without abnormal findings: Secondary | ICD-10-CM | POA: Diagnosis not present

## 2020-02-05 DIAGNOSIS — D696 Thrombocytopenia, unspecified: Secondary | ICD-10-CM | POA: Diagnosis not present

## 2020-02-05 DIAGNOSIS — Z1389 Encounter for screening for other disorder: Secondary | ICD-10-CM | POA: Diagnosis not present

## 2020-02-05 DIAGNOSIS — R269 Unspecified abnormalities of gait and mobility: Secondary | ICD-10-CM | POA: Diagnosis not present

## 2020-02-05 DIAGNOSIS — K5900 Constipation, unspecified: Secondary | ICD-10-CM | POA: Diagnosis not present

## 2020-02-05 DIAGNOSIS — M1990 Unspecified osteoarthritis, unspecified site: Secondary | ICD-10-CM | POA: Diagnosis not present

## 2020-02-05 DIAGNOSIS — J302 Other seasonal allergic rhinitis: Secondary | ICD-10-CM | POA: Diagnosis not present

## 2020-02-05 DIAGNOSIS — I10 Essential (primary) hypertension: Secondary | ICD-10-CM | POA: Diagnosis not present

## 2020-02-05 DIAGNOSIS — M519 Unspecified thoracic, thoracolumbar and lumbosacral intervertebral disc disorder: Secondary | ICD-10-CM | POA: Diagnosis not present

## 2020-02-05 DIAGNOSIS — E782 Mixed hyperlipidemia: Secondary | ICD-10-CM | POA: Diagnosis not present

## 2020-02-05 DIAGNOSIS — N400 Enlarged prostate without lower urinary tract symptoms: Secondary | ICD-10-CM | POA: Diagnosis not present

## 2020-02-05 DIAGNOSIS — G459 Transient cerebral ischemic attack, unspecified: Secondary | ICD-10-CM | POA: Diagnosis not present

## 2020-02-20 DIAGNOSIS — M1990 Unspecified osteoarthritis, unspecified site: Secondary | ICD-10-CM | POA: Diagnosis not present

## 2020-02-20 DIAGNOSIS — E782 Mixed hyperlipidemia: Secondary | ICD-10-CM | POA: Diagnosis not present

## 2020-02-20 DIAGNOSIS — I10 Essential (primary) hypertension: Secondary | ICD-10-CM | POA: Diagnosis not present

## 2020-02-20 DIAGNOSIS — N400 Enlarged prostate without lower urinary tract symptoms: Secondary | ICD-10-CM | POA: Diagnosis not present

## 2020-02-20 DIAGNOSIS — G459 Transient cerebral ischemic attack, unspecified: Secondary | ICD-10-CM | POA: Diagnosis not present

## 2020-03-04 DIAGNOSIS — I10 Essential (primary) hypertension: Secondary | ICD-10-CM | POA: Diagnosis not present

## 2020-03-06 DIAGNOSIS — I10 Essential (primary) hypertension: Secondary | ICD-10-CM | POA: Diagnosis not present

## 2020-03-24 DIAGNOSIS — G459 Transient cerebral ischemic attack, unspecified: Secondary | ICD-10-CM | POA: Diagnosis not present

## 2020-03-24 DIAGNOSIS — N400 Enlarged prostate without lower urinary tract symptoms: Secondary | ICD-10-CM | POA: Diagnosis not present

## 2020-03-24 DIAGNOSIS — E782 Mixed hyperlipidemia: Secondary | ICD-10-CM | POA: Diagnosis not present

## 2020-03-24 DIAGNOSIS — I10 Essential (primary) hypertension: Secondary | ICD-10-CM | POA: Diagnosis not present

## 2020-03-24 DIAGNOSIS — M1990 Unspecified osteoarthritis, unspecified site: Secondary | ICD-10-CM | POA: Diagnosis not present

## 2020-04-06 DIAGNOSIS — I10 Essential (primary) hypertension: Secondary | ICD-10-CM | POA: Diagnosis not present

## 2020-05-01 DIAGNOSIS — M1990 Unspecified osteoarthritis, unspecified site: Secondary | ICD-10-CM | POA: Diagnosis not present

## 2020-05-01 DIAGNOSIS — G459 Transient cerebral ischemic attack, unspecified: Secondary | ICD-10-CM | POA: Diagnosis not present

## 2020-05-01 DIAGNOSIS — I10 Essential (primary) hypertension: Secondary | ICD-10-CM | POA: Diagnosis not present

## 2020-05-01 DIAGNOSIS — E782 Mixed hyperlipidemia: Secondary | ICD-10-CM | POA: Diagnosis not present

## 2020-05-01 DIAGNOSIS — N400 Enlarged prostate without lower urinary tract symptoms: Secondary | ICD-10-CM | POA: Diagnosis not present

## 2020-05-04 DIAGNOSIS — I10 Essential (primary) hypertension: Secondary | ICD-10-CM | POA: Diagnosis not present

## 2020-05-17 DIAGNOSIS — N529 Male erectile dysfunction, unspecified: Secondary | ICD-10-CM | POA: Diagnosis not present

## 2020-05-17 DIAGNOSIS — K5900 Constipation, unspecified: Secondary | ICD-10-CM | POA: Diagnosis not present

## 2020-05-17 DIAGNOSIS — E782 Mixed hyperlipidemia: Secondary | ICD-10-CM | POA: Diagnosis not present

## 2020-05-17 DIAGNOSIS — D696 Thrombocytopenia, unspecified: Secondary | ICD-10-CM | POA: Diagnosis not present

## 2020-05-17 DIAGNOSIS — G8929 Other chronic pain: Secondary | ICD-10-CM | POA: Diagnosis not present

## 2020-05-17 DIAGNOSIS — M5116 Intervertebral disc disorders with radiculopathy, lumbar region: Secondary | ICD-10-CM | POA: Diagnosis not present

## 2020-05-17 DIAGNOSIS — Z8673 Personal history of transient ischemic attack (TIA), and cerebral infarction without residual deficits: Secondary | ICD-10-CM | POA: Diagnosis not present

## 2020-05-17 DIAGNOSIS — I10 Essential (primary) hypertension: Secondary | ICD-10-CM | POA: Diagnosis not present

## 2020-05-17 DIAGNOSIS — N400 Enlarged prostate without lower urinary tract symptoms: Secondary | ICD-10-CM | POA: Diagnosis not present

## 2020-05-17 DIAGNOSIS — Z7982 Long term (current) use of aspirin: Secondary | ICD-10-CM | POA: Diagnosis not present

## 2020-05-17 DIAGNOSIS — M1990 Unspecified osteoarthritis, unspecified site: Secondary | ICD-10-CM | POA: Diagnosis not present

## 2020-05-17 DIAGNOSIS — J302 Other seasonal allergic rhinitis: Secondary | ICD-10-CM | POA: Diagnosis not present

## 2020-05-17 DIAGNOSIS — H3530 Unspecified macular degeneration: Secondary | ICD-10-CM | POA: Diagnosis not present

## 2020-05-17 DIAGNOSIS — K219 Gastro-esophageal reflux disease without esophagitis: Secondary | ICD-10-CM | POA: Diagnosis not present

## 2020-05-19 DIAGNOSIS — I10 Essential (primary) hypertension: Secondary | ICD-10-CM | POA: Diagnosis not present

## 2020-05-19 DIAGNOSIS — M1990 Unspecified osteoarthritis, unspecified site: Secondary | ICD-10-CM | POA: Diagnosis not present

## 2020-05-19 DIAGNOSIS — N400 Enlarged prostate without lower urinary tract symptoms: Secondary | ICD-10-CM | POA: Diagnosis not present

## 2020-05-19 DIAGNOSIS — E782 Mixed hyperlipidemia: Secondary | ICD-10-CM | POA: Diagnosis not present

## 2020-05-19 DIAGNOSIS — G459 Transient cerebral ischemic attack, unspecified: Secondary | ICD-10-CM | POA: Diagnosis not present

## 2020-05-20 DIAGNOSIS — K5900 Constipation, unspecified: Secondary | ICD-10-CM | POA: Diagnosis not present

## 2020-05-20 DIAGNOSIS — M5116 Intervertebral disc disorders with radiculopathy, lumbar region: Secondary | ICD-10-CM | POA: Diagnosis not present

## 2020-05-20 DIAGNOSIS — N529 Male erectile dysfunction, unspecified: Secondary | ICD-10-CM | POA: Diagnosis not present

## 2020-05-20 DIAGNOSIS — D696 Thrombocytopenia, unspecified: Secondary | ICD-10-CM | POA: Diagnosis not present

## 2020-05-20 DIAGNOSIS — K219 Gastro-esophageal reflux disease without esophagitis: Secondary | ICD-10-CM | POA: Diagnosis not present

## 2020-05-20 DIAGNOSIS — H3530 Unspecified macular degeneration: Secondary | ICD-10-CM | POA: Diagnosis not present

## 2020-05-20 DIAGNOSIS — Z8673 Personal history of transient ischemic attack (TIA), and cerebral infarction without residual deficits: Secondary | ICD-10-CM | POA: Diagnosis not present

## 2020-05-20 DIAGNOSIS — J302 Other seasonal allergic rhinitis: Secondary | ICD-10-CM | POA: Diagnosis not present

## 2020-05-20 DIAGNOSIS — I10 Essential (primary) hypertension: Secondary | ICD-10-CM | POA: Diagnosis not present

## 2020-05-20 DIAGNOSIS — G8929 Other chronic pain: Secondary | ICD-10-CM | POA: Diagnosis not present

## 2020-05-20 DIAGNOSIS — N400 Enlarged prostate without lower urinary tract symptoms: Secondary | ICD-10-CM | POA: Diagnosis not present

## 2020-05-20 DIAGNOSIS — M1990 Unspecified osteoarthritis, unspecified site: Secondary | ICD-10-CM | POA: Diagnosis not present

## 2020-05-20 DIAGNOSIS — E782 Mixed hyperlipidemia: Secondary | ICD-10-CM | POA: Diagnosis not present

## 2020-05-20 DIAGNOSIS — Z7982 Long term (current) use of aspirin: Secondary | ICD-10-CM | POA: Diagnosis not present

## 2020-06-02 DIAGNOSIS — G459 Transient cerebral ischemic attack, unspecified: Secondary | ICD-10-CM | POA: Diagnosis not present

## 2020-06-02 DIAGNOSIS — I10 Essential (primary) hypertension: Secondary | ICD-10-CM | POA: Diagnosis not present

## 2020-06-02 DIAGNOSIS — E782 Mixed hyperlipidemia: Secondary | ICD-10-CM | POA: Diagnosis not present

## 2020-06-02 DIAGNOSIS — R269 Unspecified abnormalities of gait and mobility: Secondary | ICD-10-CM | POA: Diagnosis not present

## 2020-06-02 DIAGNOSIS — D696 Thrombocytopenia, unspecified: Secondary | ICD-10-CM | POA: Diagnosis not present

## 2020-06-04 DIAGNOSIS — I10 Essential (primary) hypertension: Secondary | ICD-10-CM | POA: Diagnosis not present

## 2020-06-09 DIAGNOSIS — K219 Gastro-esophageal reflux disease without esophagitis: Secondary | ICD-10-CM | POA: Diagnosis not present

## 2020-06-09 DIAGNOSIS — D696 Thrombocytopenia, unspecified: Secondary | ICD-10-CM | POA: Diagnosis not present

## 2020-06-09 DIAGNOSIS — G8929 Other chronic pain: Secondary | ICD-10-CM | POA: Diagnosis not present

## 2020-06-09 DIAGNOSIS — M5116 Intervertebral disc disorders with radiculopathy, lumbar region: Secondary | ICD-10-CM | POA: Diagnosis not present

## 2020-06-09 DIAGNOSIS — N529 Male erectile dysfunction, unspecified: Secondary | ICD-10-CM | POA: Diagnosis not present

## 2020-06-09 DIAGNOSIS — N400 Enlarged prostate without lower urinary tract symptoms: Secondary | ICD-10-CM | POA: Diagnosis not present

## 2020-06-09 DIAGNOSIS — J302 Other seasonal allergic rhinitis: Secondary | ICD-10-CM | POA: Diagnosis not present

## 2020-06-09 DIAGNOSIS — I10 Essential (primary) hypertension: Secondary | ICD-10-CM | POA: Diagnosis not present

## 2020-06-09 DIAGNOSIS — M1990 Unspecified osteoarthritis, unspecified site: Secondary | ICD-10-CM | POA: Diagnosis not present

## 2020-06-09 DIAGNOSIS — K5900 Constipation, unspecified: Secondary | ICD-10-CM | POA: Diagnosis not present

## 2020-06-09 DIAGNOSIS — Z8673 Personal history of transient ischemic attack (TIA), and cerebral infarction without residual deficits: Secondary | ICD-10-CM | POA: Diagnosis not present

## 2020-06-09 DIAGNOSIS — E782 Mixed hyperlipidemia: Secondary | ICD-10-CM | POA: Diagnosis not present

## 2020-06-09 DIAGNOSIS — Z7982 Long term (current) use of aspirin: Secondary | ICD-10-CM | POA: Diagnosis not present

## 2020-06-09 DIAGNOSIS — H3530 Unspecified macular degeneration: Secondary | ICD-10-CM | POA: Diagnosis not present

## 2020-06-19 DIAGNOSIS — N400 Enlarged prostate without lower urinary tract symptoms: Secondary | ICD-10-CM | POA: Diagnosis not present

## 2020-06-19 DIAGNOSIS — E782 Mixed hyperlipidemia: Secondary | ICD-10-CM | POA: Diagnosis not present

## 2020-06-19 DIAGNOSIS — M1990 Unspecified osteoarthritis, unspecified site: Secondary | ICD-10-CM | POA: Diagnosis not present

## 2020-06-19 DIAGNOSIS — I10 Essential (primary) hypertension: Secondary | ICD-10-CM | POA: Diagnosis not present

## 2020-06-19 DIAGNOSIS — G459 Transient cerebral ischemic attack, unspecified: Secondary | ICD-10-CM | POA: Diagnosis not present

## 2020-06-23 DIAGNOSIS — Z8673 Personal history of transient ischemic attack (TIA), and cerebral infarction without residual deficits: Secondary | ICD-10-CM | POA: Diagnosis not present

## 2020-06-23 DIAGNOSIS — H3530 Unspecified macular degeneration: Secondary | ICD-10-CM | POA: Diagnosis not present

## 2020-06-23 DIAGNOSIS — K219 Gastro-esophageal reflux disease without esophagitis: Secondary | ICD-10-CM | POA: Diagnosis not present

## 2020-06-23 DIAGNOSIS — I10 Essential (primary) hypertension: Secondary | ICD-10-CM | POA: Diagnosis not present

## 2020-06-23 DIAGNOSIS — K5900 Constipation, unspecified: Secondary | ICD-10-CM | POA: Diagnosis not present

## 2020-06-23 DIAGNOSIS — G8929 Other chronic pain: Secondary | ICD-10-CM | POA: Diagnosis not present

## 2020-06-23 DIAGNOSIS — D696 Thrombocytopenia, unspecified: Secondary | ICD-10-CM | POA: Diagnosis not present

## 2020-06-23 DIAGNOSIS — E782 Mixed hyperlipidemia: Secondary | ICD-10-CM | POA: Diagnosis not present

## 2020-06-23 DIAGNOSIS — N400 Enlarged prostate without lower urinary tract symptoms: Secondary | ICD-10-CM | POA: Diagnosis not present

## 2020-06-23 DIAGNOSIS — J302 Other seasonal allergic rhinitis: Secondary | ICD-10-CM | POA: Diagnosis not present

## 2020-06-23 DIAGNOSIS — M1990 Unspecified osteoarthritis, unspecified site: Secondary | ICD-10-CM | POA: Diagnosis not present

## 2020-06-23 DIAGNOSIS — Z7982 Long term (current) use of aspirin: Secondary | ICD-10-CM | POA: Diagnosis not present

## 2020-06-23 DIAGNOSIS — M5116 Intervertebral disc disorders with radiculopathy, lumbar region: Secondary | ICD-10-CM | POA: Diagnosis not present

## 2020-06-23 DIAGNOSIS — N529 Male erectile dysfunction, unspecified: Secondary | ICD-10-CM | POA: Diagnosis not present

## 2020-07-03 DIAGNOSIS — I10 Essential (primary) hypertension: Secondary | ICD-10-CM | POA: Diagnosis not present

## 2020-07-06 DIAGNOSIS — K219 Gastro-esophageal reflux disease without esophagitis: Secondary | ICD-10-CM | POA: Diagnosis not present

## 2020-07-06 DIAGNOSIS — E782 Mixed hyperlipidemia: Secondary | ICD-10-CM | POA: Diagnosis not present

## 2020-07-06 DIAGNOSIS — N400 Enlarged prostate without lower urinary tract symptoms: Secondary | ICD-10-CM | POA: Diagnosis not present

## 2020-07-06 DIAGNOSIS — G8929 Other chronic pain: Secondary | ICD-10-CM | POA: Diagnosis not present

## 2020-07-06 DIAGNOSIS — Z7982 Long term (current) use of aspirin: Secondary | ICD-10-CM | POA: Diagnosis not present

## 2020-07-06 DIAGNOSIS — J302 Other seasonal allergic rhinitis: Secondary | ICD-10-CM | POA: Diagnosis not present

## 2020-07-06 DIAGNOSIS — Z8673 Personal history of transient ischemic attack (TIA), and cerebral infarction without residual deficits: Secondary | ICD-10-CM | POA: Diagnosis not present

## 2020-07-06 DIAGNOSIS — D696 Thrombocytopenia, unspecified: Secondary | ICD-10-CM | POA: Diagnosis not present

## 2020-07-06 DIAGNOSIS — M1990 Unspecified osteoarthritis, unspecified site: Secondary | ICD-10-CM | POA: Diagnosis not present

## 2020-07-06 DIAGNOSIS — H3530 Unspecified macular degeneration: Secondary | ICD-10-CM | POA: Diagnosis not present

## 2020-07-06 DIAGNOSIS — I10 Essential (primary) hypertension: Secondary | ICD-10-CM | POA: Diagnosis not present

## 2020-07-06 DIAGNOSIS — M5116 Intervertebral disc disorders with radiculopathy, lumbar region: Secondary | ICD-10-CM | POA: Diagnosis not present

## 2020-07-06 DIAGNOSIS — K5900 Constipation, unspecified: Secondary | ICD-10-CM | POA: Diagnosis not present

## 2020-07-06 DIAGNOSIS — N529 Male erectile dysfunction, unspecified: Secondary | ICD-10-CM | POA: Diagnosis not present

## 2020-07-20 DIAGNOSIS — I10 Essential (primary) hypertension: Secondary | ICD-10-CM | POA: Diagnosis not present

## 2020-07-20 DIAGNOSIS — G459 Transient cerebral ischemic attack, unspecified: Secondary | ICD-10-CM | POA: Diagnosis not present

## 2020-07-20 DIAGNOSIS — N400 Enlarged prostate without lower urinary tract symptoms: Secondary | ICD-10-CM | POA: Diagnosis not present

## 2020-07-20 DIAGNOSIS — M1990 Unspecified osteoarthritis, unspecified site: Secondary | ICD-10-CM | POA: Diagnosis not present

## 2020-07-20 DIAGNOSIS — E782 Mixed hyperlipidemia: Secondary | ICD-10-CM | POA: Diagnosis not present

## 2020-08-04 DIAGNOSIS — I10 Essential (primary) hypertension: Secondary | ICD-10-CM | POA: Diagnosis not present

## 2020-08-20 DIAGNOSIS — M1990 Unspecified osteoarthritis, unspecified site: Secondary | ICD-10-CM | POA: Diagnosis not present

## 2020-08-20 DIAGNOSIS — N400 Enlarged prostate without lower urinary tract symptoms: Secondary | ICD-10-CM | POA: Diagnosis not present

## 2020-08-20 DIAGNOSIS — G459 Transient cerebral ischemic attack, unspecified: Secondary | ICD-10-CM | POA: Diagnosis not present

## 2020-08-20 DIAGNOSIS — I10 Essential (primary) hypertension: Secondary | ICD-10-CM | POA: Diagnosis not present

## 2020-08-20 DIAGNOSIS — E782 Mixed hyperlipidemia: Secondary | ICD-10-CM | POA: Diagnosis not present

## 2020-09-03 DIAGNOSIS — I10 Essential (primary) hypertension: Secondary | ICD-10-CM | POA: Diagnosis not present

## 2020-09-17 DIAGNOSIS — N400 Enlarged prostate without lower urinary tract symptoms: Secondary | ICD-10-CM | POA: Diagnosis not present

## 2020-09-17 DIAGNOSIS — G459 Transient cerebral ischemic attack, unspecified: Secondary | ICD-10-CM | POA: Diagnosis not present

## 2020-09-17 DIAGNOSIS — M1990 Unspecified osteoarthritis, unspecified site: Secondary | ICD-10-CM | POA: Diagnosis not present

## 2020-09-17 DIAGNOSIS — I10 Essential (primary) hypertension: Secondary | ICD-10-CM | POA: Diagnosis not present

## 2020-09-17 DIAGNOSIS — E782 Mixed hyperlipidemia: Secondary | ICD-10-CM | POA: Diagnosis not present

## 2020-09-29 DIAGNOSIS — D696 Thrombocytopenia, unspecified: Secondary | ICD-10-CM | POA: Diagnosis not present

## 2020-09-29 DIAGNOSIS — I10 Essential (primary) hypertension: Secondary | ICD-10-CM | POA: Diagnosis not present

## 2020-09-29 DIAGNOSIS — R269 Unspecified abnormalities of gait and mobility: Secondary | ICD-10-CM | POA: Diagnosis not present

## 2020-09-29 DIAGNOSIS — G459 Transient cerebral ischemic attack, unspecified: Secondary | ICD-10-CM | POA: Diagnosis not present

## 2020-09-29 DIAGNOSIS — E782 Mixed hyperlipidemia: Secondary | ICD-10-CM | POA: Diagnosis not present

## 2020-10-18 DIAGNOSIS — E782 Mixed hyperlipidemia: Secondary | ICD-10-CM | POA: Diagnosis not present

## 2020-10-18 DIAGNOSIS — I10 Essential (primary) hypertension: Secondary | ICD-10-CM | POA: Diagnosis not present

## 2020-10-18 DIAGNOSIS — N400 Enlarged prostate without lower urinary tract symptoms: Secondary | ICD-10-CM | POA: Diagnosis not present

## 2020-10-18 DIAGNOSIS — G459 Transient cerebral ischemic attack, unspecified: Secondary | ICD-10-CM | POA: Diagnosis not present

## 2020-10-18 DIAGNOSIS — M1990 Unspecified osteoarthritis, unspecified site: Secondary | ICD-10-CM | POA: Diagnosis not present

## 2020-11-17 DIAGNOSIS — E782 Mixed hyperlipidemia: Secondary | ICD-10-CM | POA: Diagnosis not present

## 2020-11-17 DIAGNOSIS — I10 Essential (primary) hypertension: Secondary | ICD-10-CM | POA: Diagnosis not present

## 2020-11-17 DIAGNOSIS — M1990 Unspecified osteoarthritis, unspecified site: Secondary | ICD-10-CM | POA: Diagnosis not present

## 2020-11-17 DIAGNOSIS — G459 Transient cerebral ischemic attack, unspecified: Secondary | ICD-10-CM | POA: Diagnosis not present

## 2020-11-17 DIAGNOSIS — N400 Enlarged prostate without lower urinary tract symptoms: Secondary | ICD-10-CM | POA: Diagnosis not present

## 2020-12-16 DIAGNOSIS — E782 Mixed hyperlipidemia: Secondary | ICD-10-CM | POA: Diagnosis not present

## 2020-12-16 DIAGNOSIS — M1990 Unspecified osteoarthritis, unspecified site: Secondary | ICD-10-CM | POA: Diagnosis not present

## 2020-12-16 DIAGNOSIS — I10 Essential (primary) hypertension: Secondary | ICD-10-CM | POA: Diagnosis not present

## 2020-12-16 DIAGNOSIS — N400 Enlarged prostate without lower urinary tract symptoms: Secondary | ICD-10-CM | POA: Diagnosis not present

## 2020-12-16 DIAGNOSIS — G459 Transient cerebral ischemic attack, unspecified: Secondary | ICD-10-CM | POA: Diagnosis not present

## 2021-01-04 DIAGNOSIS — I10 Essential (primary) hypertension: Secondary | ICD-10-CM | POA: Diagnosis not present

## 2021-02-03 DIAGNOSIS — I10 Essential (primary) hypertension: Secondary | ICD-10-CM | POA: Diagnosis not present

## 2021-02-03 DIAGNOSIS — N400 Enlarged prostate without lower urinary tract symptoms: Secondary | ICD-10-CM | POA: Diagnosis not present

## 2021-02-03 DIAGNOSIS — E782 Mixed hyperlipidemia: Secondary | ICD-10-CM | POA: Diagnosis not present

## 2021-02-03 DIAGNOSIS — M1990 Unspecified osteoarthritis, unspecified site: Secondary | ICD-10-CM | POA: Diagnosis not present

## 2021-02-03 DIAGNOSIS — G459 Transient cerebral ischemic attack, unspecified: Secondary | ICD-10-CM | POA: Diagnosis not present

## 2021-02-05 DIAGNOSIS — M519 Unspecified thoracic, thoracolumbar and lumbosacral intervertebral disc disorder: Secondary | ICD-10-CM | POA: Diagnosis not present

## 2021-02-05 DIAGNOSIS — R269 Unspecified abnormalities of gait and mobility: Secondary | ICD-10-CM | POA: Diagnosis not present

## 2021-02-05 DIAGNOSIS — M1990 Unspecified osteoarthritis, unspecified site: Secondary | ICD-10-CM | POA: Diagnosis not present

## 2021-02-05 DIAGNOSIS — N400 Enlarged prostate without lower urinary tract symptoms: Secondary | ICD-10-CM | POA: Diagnosis not present

## 2021-02-05 DIAGNOSIS — D696 Thrombocytopenia, unspecified: Secondary | ICD-10-CM | POA: Diagnosis not present

## 2021-02-05 DIAGNOSIS — E782 Mixed hyperlipidemia: Secondary | ICD-10-CM | POA: Diagnosis not present

## 2021-02-05 DIAGNOSIS — G459 Transient cerebral ischemic attack, unspecified: Secondary | ICD-10-CM | POA: Diagnosis not present

## 2021-02-05 DIAGNOSIS — Z1389 Encounter for screening for other disorder: Secondary | ICD-10-CM | POA: Diagnosis not present

## 2021-02-05 DIAGNOSIS — Z0000 Encounter for general adult medical examination without abnormal findings: Secondary | ICD-10-CM | POA: Diagnosis not present

## 2021-02-05 DIAGNOSIS — I10 Essential (primary) hypertension: Secondary | ICD-10-CM | POA: Diagnosis not present

## 2021-02-15 DIAGNOSIS — E782 Mixed hyperlipidemia: Secondary | ICD-10-CM | POA: Diagnosis not present

## 2021-02-15 DIAGNOSIS — N400 Enlarged prostate without lower urinary tract symptoms: Secondary | ICD-10-CM | POA: Diagnosis not present

## 2021-02-15 DIAGNOSIS — M1990 Unspecified osteoarthritis, unspecified site: Secondary | ICD-10-CM | POA: Diagnosis not present

## 2021-02-15 DIAGNOSIS — G459 Transient cerebral ischemic attack, unspecified: Secondary | ICD-10-CM | POA: Diagnosis not present

## 2021-02-15 DIAGNOSIS — I10 Essential (primary) hypertension: Secondary | ICD-10-CM | POA: Diagnosis not present

## 2021-03-04 DIAGNOSIS — H43393 Other vitreous opacities, bilateral: Secondary | ICD-10-CM | POA: Diagnosis not present

## 2021-03-04 DIAGNOSIS — H40033 Anatomical narrow angle, bilateral: Secondary | ICD-10-CM | POA: Diagnosis not present

## 2021-03-05 DIAGNOSIS — I10 Essential (primary) hypertension: Secondary | ICD-10-CM | POA: Diagnosis not present

## 2021-03-16 DIAGNOSIS — M1990 Unspecified osteoarthritis, unspecified site: Secondary | ICD-10-CM | POA: Diagnosis not present

## 2021-03-16 DIAGNOSIS — N400 Enlarged prostate without lower urinary tract symptoms: Secondary | ICD-10-CM | POA: Diagnosis not present

## 2021-03-16 DIAGNOSIS — I10 Essential (primary) hypertension: Secondary | ICD-10-CM | POA: Diagnosis not present

## 2021-03-16 DIAGNOSIS — E782 Mixed hyperlipidemia: Secondary | ICD-10-CM | POA: Diagnosis not present

## 2021-04-15 DIAGNOSIS — N400 Enlarged prostate without lower urinary tract symptoms: Secondary | ICD-10-CM | POA: Diagnosis not present

## 2021-04-15 DIAGNOSIS — I10 Essential (primary) hypertension: Secondary | ICD-10-CM | POA: Diagnosis not present

## 2021-04-15 DIAGNOSIS — E782 Mixed hyperlipidemia: Secondary | ICD-10-CM | POA: Diagnosis not present

## 2021-04-28 ENCOUNTER — Emergency Department (HOSPITAL_COMMUNITY): Payer: PPO

## 2021-04-28 ENCOUNTER — Other Ambulatory Visit: Payer: Self-pay

## 2021-04-28 ENCOUNTER — Encounter (HOSPITAL_COMMUNITY): Payer: Self-pay | Admitting: Emergency Medicine

## 2021-04-28 ENCOUNTER — Inpatient Hospital Stay (HOSPITAL_COMMUNITY)
Admission: EM | Admit: 2021-04-28 | Discharge: 2021-04-30 | DRG: 552 | Disposition: A | Discharge status: Home / Self Care | Payer: PPO | Attending: Family Medicine | Admitting: Family Medicine

## 2021-04-28 DIAGNOSIS — Z79899 Other long term (current) drug therapy: Secondary | ICD-10-CM | POA: Diagnosis not present

## 2021-04-28 DIAGNOSIS — Z7902 Long term (current) use of antithrombotics/antiplatelets: Secondary | ICD-10-CM | POA: Diagnosis not present

## 2021-04-28 DIAGNOSIS — R269 Unspecified abnormalities of gait and mobility: Secondary | ICD-10-CM

## 2021-04-28 DIAGNOSIS — R001 Bradycardia, unspecified: Secondary | ICD-10-CM

## 2021-04-28 DIAGNOSIS — M5136 Other intervertebral disc degeneration, lumbar region: Secondary | ICD-10-CM | POA: Diagnosis not present

## 2021-04-28 DIAGNOSIS — R296 Repeated falls: Secondary | ICD-10-CM | POA: Diagnosis not present

## 2021-04-28 DIAGNOSIS — Z20822 Contact with and (suspected) exposure to covid-19: Secondary | ICD-10-CM | POA: Diagnosis present

## 2021-04-28 DIAGNOSIS — R059 Cough, unspecified: Secondary | ICD-10-CM | POA: Diagnosis not present

## 2021-04-28 DIAGNOSIS — E7800 Pure hypercholesterolemia, unspecified: Secondary | ICD-10-CM | POA: Diagnosis not present

## 2021-04-28 DIAGNOSIS — M50322 Other cervical disc degeneration at C5-C6 level: Secondary | ICD-10-CM | POA: Diagnosis present

## 2021-04-28 DIAGNOSIS — M21371 Foot drop, right foot: Secondary | ICD-10-CM | POA: Diagnosis not present

## 2021-04-28 DIAGNOSIS — R600 Localized edema: Secondary | ICD-10-CM | POA: Diagnosis not present

## 2021-04-28 DIAGNOSIS — Z87891 Personal history of nicotine dependence: Secondary | ICD-10-CM

## 2021-04-28 DIAGNOSIS — M4804 Spinal stenosis, thoracic region: Secondary | ICD-10-CM | POA: Diagnosis not present

## 2021-04-28 DIAGNOSIS — M4712 Other spondylosis with myelopathy, cervical region: Secondary | ICD-10-CM | POA: Diagnosis present

## 2021-04-28 DIAGNOSIS — M4802 Spinal stenosis, cervical region: Principal | ICD-10-CM

## 2021-04-28 DIAGNOSIS — M48061 Spinal stenosis, lumbar region without neurogenic claudication: Secondary | ICD-10-CM | POA: Diagnosis not present

## 2021-04-28 DIAGNOSIS — M21372 Foot drop, left foot: Secondary | ICD-10-CM | POA: Diagnosis not present

## 2021-04-28 DIAGNOSIS — M4714 Other spondylosis with myelopathy, thoracic region: Secondary | ICD-10-CM | POA: Diagnosis not present

## 2021-04-28 DIAGNOSIS — D696 Thrombocytopenia, unspecified: Secondary | ICD-10-CM

## 2021-04-28 DIAGNOSIS — K219 Gastro-esophageal reflux disease without esophagitis: Secondary | ICD-10-CM | POA: Diagnosis not present

## 2021-04-28 DIAGNOSIS — I10 Essential (primary) hypertension: Secondary | ICD-10-CM | POA: Diagnosis present

## 2021-04-28 DIAGNOSIS — G959 Disease of spinal cord, unspecified: Secondary | ICD-10-CM

## 2021-04-28 DIAGNOSIS — Z7982 Long term (current) use of aspirin: Secondary | ICD-10-CM | POA: Diagnosis not present

## 2021-04-28 DIAGNOSIS — Z8673 Personal history of transient ischemic attack (TIA), and cerebral infarction without residual deficits: Secondary | ICD-10-CM | POA: Diagnosis not present

## 2021-04-28 DIAGNOSIS — M51369 Other intervertebral disc degeneration, lumbar region without mention of lumbar back pain or lower extremity pain: Secondary | ICD-10-CM

## 2021-04-28 DIAGNOSIS — G4489 Other headache syndrome: Secondary | ICD-10-CM | POA: Diagnosis not present

## 2021-04-28 DIAGNOSIS — M5030 Other cervical disc degeneration, unspecified cervical region: Secondary | ICD-10-CM

## 2021-04-28 DIAGNOSIS — M5104 Intervertebral disc disorders with myelopathy, thoracic region: Secondary | ICD-10-CM | POA: Diagnosis not present

## 2021-04-28 DIAGNOSIS — R531 Weakness: Secondary | ICD-10-CM | POA: Diagnosis not present

## 2021-04-28 DIAGNOSIS — M2578 Osteophyte, vertebrae: Secondary | ICD-10-CM | POA: Diagnosis not present

## 2021-04-28 DIAGNOSIS — M5106 Intervertebral disc disorders with myelopathy, lumbar region: Secondary | ICD-10-CM | POA: Diagnosis not present

## 2021-04-28 DIAGNOSIS — M6281 Muscle weakness (generalized): Secondary | ICD-10-CM | POA: Diagnosis not present

## 2021-04-28 LAB — CBC WITH DIFFERENTIAL/PLATELET
Abs Immature Granulocytes: 0.01 10*3/uL (ref 0.00–0.07)
Basophils Absolute: 0 10*3/uL (ref 0.0–0.1)
Basophils Relative: 0 %
Eosinophils Absolute: 0.3 10*3/uL (ref 0.0–0.5)
Eosinophils Relative: 5 %
HCT: 42.7 % (ref 39.0–52.0)
Hemoglobin: 13.3 g/dL (ref 13.0–17.0)
Immature Granulocytes: 0 %
Lymphocytes Relative: 29 %
Lymphs Abs: 1.7 10*3/uL (ref 0.7–4.0)
MCH: 27.4 pg (ref 26.0–34.0)
MCHC: 31.1 g/dL (ref 30.0–36.0)
MCV: 88 fL (ref 80.0–100.0)
Monocytes Absolute: 0.5 10*3/uL (ref 0.1–1.0)
Monocytes Relative: 9 %
Neutro Abs: 3.3 10*3/uL (ref 1.7–7.7)
Neutrophils Relative %: 57 %
Platelets: 122 10*3/uL — ABNORMAL LOW (ref 150–400)
RBC: 4.85 MIL/uL (ref 4.22–5.81)
RDW: 14.1 % (ref 11.5–15.5)
WBC: 5.9 10*3/uL (ref 4.0–10.5)
nRBC: 0 % (ref 0.0–0.2)

## 2021-04-28 LAB — COMPREHENSIVE METABOLIC PANEL
ALT: 19 U/L (ref 0–44)
AST: 21 U/L (ref 15–41)
Albumin: 3.6 g/dL (ref 3.5–5.0)
Alkaline Phosphatase: 44 U/L (ref 38–126)
Anion gap: 6 (ref 5–15)
BUN: 7 mg/dL — ABNORMAL LOW (ref 8–23)
CO2: 28 mmol/L (ref 22–32)
Calcium: 8.8 mg/dL — ABNORMAL LOW (ref 8.9–10.3)
Chloride: 100 mmol/L (ref 98–111)
Creatinine, Ser: 0.86 mg/dL (ref 0.61–1.24)
GFR, Estimated: >60 mL/min (ref >60)
Glucose, Bld: 101 mg/dL — ABNORMAL HIGH (ref 70–99)
Potassium: 4.6 mmol/L (ref 3.5–5.1)
Sodium: 134 mmol/L — ABNORMAL LOW (ref 135–145)
Total Bilirubin: 0.3 mg/dL (ref 0.3–1.2)
Total Protein: 6.7 g/dL (ref 6.5–8.1)

## 2021-04-28 LAB — T4, FREE: Free T4: 1.02 ng/dL (ref 0.61–1.12)

## 2021-04-28 LAB — TSH: TSH: 1.276 u[IU]/mL (ref 0.350–4.500)

## 2021-04-28 LAB — RESP PANEL BY RT-PCR (FLU A&B, COVID) ARPGX2
Influenza A by PCR: NEGATIVE
Influenza B by PCR: NEGATIVE
SARS Coronavirus 2 by RT PCR: NEGATIVE

## 2021-04-28 MED ORDER — ONDANSETRON HCL 4 MG/2ML IJ SOLN: 4.0000 mg | Freq: Four times a day (QID) | INTRAMUSCULAR | Status: DC | PRN

## 2021-04-28 MED ORDER — ACETAMINOPHEN 650 MG RE SUPP: 650.0000 mg | Freq: Four times a day (QID) | RECTAL | Status: DC | PRN

## 2021-04-28 MED ORDER — ASPIRIN EC 81 MG PO TBEC
81.0000 mg | DELAYED_RELEASE_TABLET | Freq: Every day | ORAL | Status: DC
  Administered 2021-04-29: 81 mg via ORAL
  Filled 2021-04-28: qty 1

## 2021-04-28 MED ORDER — FINASTERIDE 5 MG PO TABS
5.0000 mg | ORAL_TABLET | Freq: Every morning | ORAL | Status: DC
  Administered 2021-04-29 – 2021-04-30 (×2): 5 mg via ORAL
  Filled 2021-04-28 (×2): qty 1

## 2021-04-28 MED ORDER — ENOXAPARIN SODIUM 40 MG/0.4ML IJ SOSY
40.0000 mg | PREFILLED_SYRINGE | INTRAMUSCULAR | Status: DC
  Administered 2021-04-28 – 2021-04-29 (×2): 40 mg via SUBCUTANEOUS
  Filled 2021-04-28 (×2): qty 0.4

## 2021-04-28 MED ORDER — LACTATED RINGERS IV SOLN: INTRAVENOUS | Status: AC

## 2021-04-28 MED ORDER — ONDANSETRON HCL 4 MG PO TABS: 4.0000 mg | ORAL_TABLET | Freq: Four times a day (QID) | ORAL | Status: DC | PRN

## 2021-04-28 MED ORDER — ATORVASTATIN CALCIUM 10 MG PO TABS
10.0000 mg | ORAL_TABLET | Freq: Every day | ORAL | Status: DC
  Administered 2021-04-29 – 2021-04-30 (×2): 10 mg via ORAL
  Filled 2021-04-28 (×2): qty 1

## 2021-04-28 MED ORDER — SENNOSIDES-DOCUSATE SODIUM 8.6-50 MG PO TABS: 1.0000 | ORAL_TABLET | Freq: Every evening | ORAL | Status: DC | PRN

## 2021-04-28 MED ORDER — ACETAMINOPHEN 325 MG PO TABS
650.0000 mg | ORAL_TABLET | Freq: Four times a day (QID) | ORAL | Status: DC | PRN
  Administered 2021-04-30: 650 mg via ORAL
  Filled 2021-04-28: qty 2

## 2021-04-28 MED ORDER — RAMIPRIL 5 MG PO CAPS
10.0000 mg | ORAL_CAPSULE | Freq: Every morning | ORAL | Status: DC
  Administered 2021-04-29 – 2021-04-30 (×2): 10 mg via ORAL
  Filled 2021-04-28 (×2): qty 2

## 2021-04-28 NOTE — Assessment & Plan Note (Signed)
Chronic. Neurosurgery consulted

## 2021-04-28 NOTE — ED Notes (Signed)
Admitting provider at bedside to evaluate patient and update him on plan of care and admission.

## 2021-04-28 NOTE — ED Triage Notes (Signed)
Patient went to PCP today for evaluation cold symptoms. PCP sent patient to ED for evaluation of bilateral lower extremity weakness that started two weeks ago. Patient alert, oriented, and in no apparent distress at this time.  BP 170/72 HR 68 98% on room air 98.74F

## 2021-04-28 NOTE — ED Notes (Signed)
Patient provided with something to eat and drink at this time with permission from admitting provider.  °

## 2021-04-28 NOTE — Assessment & Plan Note (Signed)
PT to evaluate Neurosurgery consulted

## 2021-04-28 NOTE — ED Provider Notes (Signed)
[facility] EMERGENCY DEPARTMENT Provider Note   CSN: [id number] Arrival date & time: 04/28/21  1503     History  Chief Complaint  Patient presents with   Weakness    Terry Gray is a 88 y.o. male.  Patient c/o bilateral leg/foot weakness in past year, slowly getting worse, without abrupt/acute change today. States ~ 1 yr ago began using walker to walker. Since then, progressive leg weakness, such that feels as if dragging feet along, but not stepping. States not able to dorsiflex bilateral feet/ankles for several weeks. In past few days, not able to walk w walker. Hx lumbar ddd. Denies neck/back pain currently. No radicular pain. States bil feet feel numb/tingling. States at times bilateral hand weakness, but not nearly as bad as legs/feet. Went to [facility] today and was sent to ED.  Denies headache. No wt loss. No change in speech or vision. No unilateral symptoms. No fever or chills.   The history is provided by the patient, medical records, a relative and a caregiver.  Weakness Associated symptoms: no abdominal pain, no chest pain, no dysuria, no fever, no headaches, no shortness of breath and no vomiting       Home Medications Prior to Admission medications   Medication Sig Start Date End Date Taking? Authorizing Provider  aspirin EC 81 MG tablet Take 81 mg by mouth daily.    [provider]  atorvastatin (LIPITOR) 20 MG tablet Take 10 mg by mouth daily.    [provider]  Cholecalciferol (VITAMIN D) 2000 UNITS CAPS Take 2,000 Units by mouth daily.     [provider]  clopidogrel (PLAVIX) 75 MG tablet Take 75 mg by mouth every morning.    [provider]  famotidine (PEPCID) 40 MG tablet Take 40 mg by mouth daily.    [provider]  finasteride (PROSCAR) 5 MG tablet Take 5 mg by mouth every morning.     [provider]  Omega 3-6-9 Fatty Acids (TRIPLE OMEGA-3-6-9) CAPS Take 1 capsule by mouth daily.     [provider]  Psyllium (METAMUCIL) 48.57 % POWD Take 1 scoop by mouth daily.     [provider]  ramipril (ALTACE) 10 MG capsule Take 10 mg by mouth every morning.  05/23/13   [provider]  vitamin C (ASCORBIC ACID) 500 MG tablet Take 500 mg by mouth daily.    [provider]      Allergies    Patient has no known allergies.    Review of Systems   Review of Systems  Constitutional:  Negative for fever and unexpected weight change.  HENT:  Negative for sore throat.   Eyes:  Negative for redness and visual disturbance.  Respiratory:  Negative for shortness of breath.   Cardiovascular:  Negative for chest pain.  Gastrointestinal:  Negative for abdominal pain and vomiting.  Genitourinary:  Negative for difficulty urinating, dysuria and flank pain.  Musculoskeletal:  Negative for back pain and neck pain.  Skin:  Negative for rash.  Neurological:  Positive for weakness. Negative for speech difficulty and headaches.  Hematological:  Does not bruise/bleed easily.  Psychiatric/Behavioral:  Negative for confusion.    Physical Exam Updated Vital Signs BP (!) 160/68 (BP Location: Left Arm)    Pulse (!) 58    Temp 98.6 F (37 C) (Oral)    Resp 18    SpO2 95%  Physical Exam Vitals and nursing note reviewed.  Constitutional:  Appearance: Normal appearance. He is well-developed.  HENT:     Head: Atraumatic.     Nose: Nose normal.     Mouth/Throat:     Mouth: Mucous membranes are moist.     Pharynx: Oropharynx is clear.  Eyes:     General: No scleral icterus.    Conjunctiva/sclera: Conjunctivae normal.     Pupils: Pupils are equal, round, and reactive to light.  Neck:     Vascular: No carotid bruit.     Trachea: No tracheal deviation.     Comments: Thyroid not grossly enlarged or tender.  Cardiovascular:     Rate and Rhythm: Normal rate and regular rhythm.     Pulses: Normal pulses.     Heart sounds: Normal heart sounds. No murmur  heard.   No friction rub. No gallop.  Pulmonary:     Effort: Pulmonary effort is normal. No accessory muscle usage or respiratory distress.     Breath sounds: Normal breath sounds.  Abdominal:     General: Bowel sounds are normal. There is no distension.     Palpations: Abdomen is soft. There is no mass.     Tenderness: There is no abdominal tenderness.  Genitourinary:    Comments: No cva tenderness. Musculoskeletal:        General: No swelling.     Cervical back: Normal range of motion and neck supple. No rigidity.     Right lower leg: No edema.     Left lower leg: No edema.     Comments: CTLS spine, non tender, aligned, no step off. Distal pulses palp bil.   Skin:    General: Skin is warm and dry.     Findings: No rash.  Neurological:     Mental Status: He is alert.     Comments: Alert, speech clear. Patient with marked weakness of dorsiflexion bilateral feet/great toe, otherwise bilateral motor/sens fxn appears intact, 5/5, bil. Sens to pressure/touch grossly intact bil.   Psychiatric:        Mood and Affect: Mood normal.    ED Results / Procedures / Treatments   Labs (all labs ordered are listed, but only abnormal results are displayed) Results for orders placed or performed during the hospital encounter of 04/28/21  Resp Panel by RT-PCR (Flu A&B, Covid) Nasopharyngeal Swab   Specimen: Nasopharyngeal Swab; Nasopharyngeal(NP) swabs in vial transport medium  Result Value Ref Range   SARS Coronavirus 2 by RT PCR NEGATIVE NEGATIVE   Influenza A by PCR NEGATIVE NEGATIVE   Influenza B by PCR NEGATIVE NEGATIVE  Comprehensive metabolic panel  Result Value Ref Range   Sodium 134 (L) 135 - 145 mmol/L   Potassium 4.6 3.5 - 5.1 mmol/L   Chloride 100 98 - 111 mmol/L   CO2 28 22 - 32 mmol/L   Glucose, Bld 101 (H) 70 - 99 mg/dL   BUN 7 (L) 8 - 23 mg/dL   Creatinine, Ser 0.86 0.61 - 1.24 mg/dL   Calcium 8.8 (L) 8.9 - 10.3 mg/dL   Total Protein 6.7 6.5 - 8.1 g/dL   Albumin 3.6 3.5  - 5.0 g/dL   AST 21 15 - 41 U/L   ALT 19 0 - 44 U/L   Alkaline Phosphatase 44 38 - 126 U/L   Total Bilirubin 0.3 0.3 - 1.2 mg/dL   GFR, Estimated >60 >60 mL/min   Anion gap 6 5 - 15  CBC with Differential  Result Value Ref Range   WBC 5.9 4.0 -  10.5 K/uL   RBC 4.85 4.22 - 5.81 MIL/uL   Hemoglobin 13.3 13.0 - 17.0 g/dL   HCT 42.7 39.0 - 52.0 %   MCV 88.0 80.0 - 100.0 fL   MCH 27.4 26.0 - 34.0 pg   MCHC 31.1 30.0 - 36.0 g/dL   RDW 14.1 11.5 - 15.5 %   Platelets 122 (L) 150 - 400 K/uL   nRBC 0.0 0.0 - 0.2 %   Neutrophils Relative % 57 %   Neutro Abs 3.3 1.7 - 7.7 K/uL   Lymphocytes Relative 29 %   Lymphs Abs 1.7 0.7 - 4.0 K/uL   Monocytes Relative 9 %   Monocytes Absolute 0.5 0.1 - 1.0 K/uL   Eosinophils Relative 5 %   Eosinophils Absolute 0.3 0.0 - 0.5 K/uL   Basophils Relative 0 %   Basophils Absolute 0.0 0.0 - 0.1 K/uL   Immature Granulocytes 0 %   Abs Immature Granulocytes 0.01 0.00 - 0.07 K/uL   DG Chest 2 View  Result Date: 04/28/2021 CLINICAL DATA:  Cough EXAM: CHEST - 2 VIEW COMPARISON:  07/05/2013 FINDINGS: Cardiac size is within normal limits. There are no signs of pulmonary edema or focal pulmonary consolidation. Small linear density in the lateral right lower lung fields may suggest minimal scarring. There is no pleural effusion or pneumothorax. IMPRESSION: No active cardiopulmonary disease. Electronically Signed   By: Elmer Picker M.D.   On: 04/28/2021 15:56   CT HEAD WO CONTRAST (5MM)  Result Date: 04/28/2021 CLINICAL DATA:  A male at age 31 presents with weakness of bilateral lower extremities that began 2 weeks ago. EXAM: CT HEAD WITHOUT CONTRAST TECHNIQUE: Contiguous axial images were obtained from the base of the skull through the vertex without intravenous contrast. RADIATION DOSE REDUCTION: This exam was performed according to the departmental dose-optimization program which includes automated exposure control, adjustment of the mA and/or kV according to  patient size and/or use of iterative reconstruction technique. COMPARISON:  May of 2015. FINDINGS: Brain: No evidence of acute infarction, hemorrhage, hydrocephalus, extra-axial collection or mass lesion/mass effect. Signs of mild atrophy and chronic microvascular ischemic change similar to prior imaging. Vascular: No hyperdense vessel or unexpected calcification. Skull: Normal. Negative for fracture or focal lesion. Sinuses/Orbits: Visualized paranasal sinuses and orbits are unremarkable. Other: None. IMPRESSION: 1. No acute intracranial abnormality. 2. Signs of mild atrophy and chronic microvascular ischemic change similar to prior imaging. Electronically Signed   By: Zetta Bills M.D.   On: 04/28/2021 15:56    EKG EKG Interpretation  Date/Time:  Wednesday April 28 2021 15:14:47 EST Ventricular Rate:  55 PR Interval:  164 QRS Duration: 90 QT Interval:  390 QTC Calculation: 373 R Axis:   9 Text Interpretation: Sinus bradycardia Nonspecific ST abnormality No significant change since last tracing Confirmed by Lajean Saver [phone]) on 04/28/2021 6:26:25 PM  Radiology DG Chest 2 View  Result Date: 04/28/2021 CLINICAL DATA:  Cough EXAM: CHEST - 2 VIEW COMPARISON:  07/05/2013 FINDINGS: Cardiac size is within normal limits. There are no signs of pulmonary edema or focal pulmonary consolidation. Small linear density in the lateral right lower lung fields may suggest minimal scarring. There is no pleural effusion or pneumothorax. IMPRESSION: No active cardiopulmonary disease. Electronically Signed   By: Elmer Picker M.D.   On: 04/28/2021 15:56   CT HEAD WO CONTRAST (5MM)  Result Date: 04/28/2021 CLINICAL DATA:  A male at age 16 presents with weakness of bilateral lower extremities that began 2 weeks ago. EXAM:  CT HEAD WITHOUT CONTRAST TECHNIQUE: Contiguous axial images were obtained from the base of the skull through the vertex without intravenous contrast. RADIATION DOSE REDUCTION: This exam  was performed according to the departmental dose-optimization program which includes automated exposure control, adjustment of the mA and/or kV according to patient size and/or use of iterative reconstruction technique. COMPARISON:  May of 2015. FINDINGS: Brain: No evidence of acute infarction, hemorrhage, hydrocephalus, extra-axial collection or mass lesion/mass effect. Signs of mild atrophy and chronic microvascular ischemic change similar to prior imaging. Vascular: No hyperdense vessel or unexpected calcification. Skull: Normal. Negative for fracture or focal lesion. Sinuses/Orbits: Visualized paranasal sinuses and orbits are unremarkable. Other: None. IMPRESSION: 1. No acute intracranial abnormality. 2. Signs of mild atrophy and chronic microvascular ischemic change similar to prior imaging. Electronically Signed   By: Zetta Bills M.D.   On: 04/28/2021 15:56   MR Cervical Spine Wo Contrast  Result Date: 04/28/2021 CLINICAL DATA:  Myelopathy, bilateral foot drop, hand weakness EXAM: MRI CERVICAL SPINE WITHOUT CONTRAST TECHNIQUE: Multiplanar, multisequence MR imaging of the cervical spine was performed. No intravenous contrast was administered. COMPARISON:  None. FINDINGS: Evaluation is limited by poor study quality, in part secondary to motion. Alignment: Straightening of the normal cervical lordosis. Trace anterolisthesis C4 on C5. Trace retrolisthesis C5 on C6. Trace anterolisthesis C7 on T1 Vertebrae: Diffusely decreased marrow signal. No definite acute fracture. Congenitally short pedicles, which narrow the AP diameter of the spinal canal. 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 Posterior Fossa, vertebral arteries, paraspinal tissues: Degenerative pannus formation at C1. Disc levels: C1-C2: Pannus formation causes moderate spinal canal stenosis. Evaluation of  the neural foramina is limited but there appears to be at least moderate stenosis of the right neural foramen. C2-C3: Moderate disc bulge. Severe left greater than right facet and uncovertebral hypertrophy. Moderate spinal canal stenosis. Severe left-greater-than-right neural foraminal narrowing. C3-C4: Moderate disc bulge. Facet and uncovertebral hypertrophy. Moderate spinal canal stenosis. Severe left and moderate right neural foraminal narrowing. C4-C5: Trace anterolisthesis. Large broad-based disc bulge with superimposed central protrusion or disc osteophyte. Severe spinal canal stenosis with compressive myelopathy. Uncovertebral and facet arthropathy. Moderate bilateral neural foraminal narrowing. C5-C6: Trace retrolisthesis with large disc osteophyte complex. Facet and uncovertebral hypertrophy. Severe spinal canal stenosis with compressive myelopathy. Severe right and moderate left neural foraminal narrowing. C6-C7: Large disc bulge. Severe spinal canal stenosis with evidence of compressive myelopathy. Uncovertebral and facet arthropathy. Mild bilateral neural foraminal narrowing. C7-T1: Trace anterolisthesis with disc unroofing. Mild spinal canal stenosis. Facet arthropathy. No neural foraminal narrowing. IMPRESSION: 1. Evaluation is limited by poor study quality and motion artifact. 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 Additional less significant spinal canal stenosis (mild or moderate) at the other cervical levels. 2. Multilevel disc degenerative changes, facet arthropathy, and uncovertebral hypertrophy, which causes up to severe neural foraminal narrowing bilaterally at C2-C3, on the left at C3-C4, and on the right at C5-C6. 3. Diffusely decreased marrow signal, which is nonspecific but can be seen in the setting of bone marrow hyperplasia, iron  deposition, renal osteodystrophy, or myeloproliferative disease. These results were called by telephone at the time of interpretation on 04/28/2021 at 7:25 pm to provider Lajean Saver , who verbally acknowledged  these results. Electronically Signed   By: Wiliam Ke M.D.   On: 04/28/2021 19:25   MR THORACIC SPINE WO CONTRAST  Result Date: 04/28/2021 CLINICAL DATA:  Acute lumbar myelopathy.  Bilateral foot drop. EXAM: MRI THORACIC AND LUMBAR SPINE WITHOUT CONTRAST TECHNIQUE: Multiplanar and multiecho pulse sequences of the thoracic and lumbar spine were obtained without intravenous contrast. COMPARISON:  10/29/2010 FINDINGS: MRI THORACIC SPINE FINDINGS Alignment:  Normal Vertebrae: Patchy low T1 marrow signal intensity could be due to anemia, smoking or osteoporosis. No focal bone lesions are identified. Cord: Grossly normal cord signal intensity. No cord lesions or syrinx. Paraspinal and other soft tissues: No significant paraspinal findings. Disc levels: Moderate degenerative changes in the thoracic spine with small disc protrusions and spurring but no significant canal stenosis. MRI LUMBAR SPINE FINDINGS Segmentation: There are five lumbar type vertebral bodies. The last full intervertebral disc space is labeled L5-S1. Alignment: Bilateral pars defects at L4 with a grade 1-2 spondylolisthesis which is unchanged. Vertebrae: Low T1 marrow signal could be due to anemia, smoking or osteoporosis. No focal bone lesions are identified. Marked endplate reactive changes at L4-5 with near Conus medullaris and cauda equina: Conus extends to the T12-L1 level. Conus and cauda equina appear normal. Paraspinal and other soft tissues: No significant paraspinal or retroperitoneal findings. Disc levels: T12-L1: No significant findings. L1-2: Bulging annulus and moderate facet disease with mild bilateral lateral recess stenosis but no significant spinal stenosis or foraminal stenosis. L2-3: Bulging degenerated annulus and  shallow central disc protrusion in conjunction with facet disease contributing to mild spinal and moderate bilateral lateral recess stenosis. L3-4: Bulging annulus and advanced facet disease with mild spinal and bilateral lateral recess stenosis. No significant foraminal stenosis. L4-5: Bilateral pars defects with grade 1-2 anterolisthesis of L4. There is a bulging uncovered disc and significant inflate reactive changes. No significant spinal stenosis. There is significant bilateral foraminal stenosis. L5-S1: No significant findings. IMPRESSION: 1. Stable bilateral pars defects at L4 with a grade 1-2 spondylolisthesis and associated advanced degenerative disc disease and facet disease at L4-5. 2. Significant bilateral foraminal stenosis at L4-5. 3. Mild spinal and bilateral lateral recess stenosis at L2-3 and L3-4. 4. Mild bilateral lateral recess stenosis at L1-2. 5. No significant thoracic disc protrusions or significant spinal stenosis. 6. Grossly normal MR appearance of the thoracic spinal cord. Electronically Signed   By: Rudie Meyer M.D.   On: 04/28/2021 19:45   MR LUMBAR SPINE WO CONTRAST  Result Date: 04/28/2021 CLINICAL DATA:  Acute lumbar myelopathy.  Bilateral foot drop. EXAM: MRI THORACIC AND LUMBAR SPINE WITHOUT CONTRAST TECHNIQUE: Multiplanar and multiecho pulse sequences of the thoracic and lumbar spine were obtained without intravenous contrast. COMPARISON:  10/29/2010 FINDINGS: MRI THORACIC SPINE FINDINGS Alignment:  Normal Vertebrae: Patchy low T1 marrow signal intensity could be due to anemia, smoking or osteoporosis. No focal bone lesions are identified. Cord: Grossly normal cord signal intensity. No cord lesions or syrinx. Paraspinal and other soft tissues: No significant paraspinal findings. Disc levels: Moderate degenerative changes in the thoracic spine with small disc protrusions and spurring but no significant canal stenosis. MRI LUMBAR SPINE FINDINGS Segmentation: There are five  lumbar type vertebral bodies. The last full intervertebral disc space is labeled L5-S1. Alignment: Bilateral pars defects at L4 with a grade 1-2 spondylolisthesis which is unchanged. Vertebrae: Low T1 marrow signal could be due to anemia, smoking or osteoporosis. No focal bone lesions are identified. Marked endplate reactive changes at L4-5 with near Conus medullaris and cauda  equina: Conus extends to the T12-L1 level. Conus and cauda equina appear normal. Paraspinal and other soft tissues: No significant paraspinal or retroperitoneal findings. Disc levels: T12-L1: No significant findings. L1-2: Bulging annulus and moderate facet disease with mild bilateral lateral recess stenosis but no significant spinal stenosis or foraminal stenosis. L2-3: Bulging degenerated annulus and shallow central disc protrusion in conjunction with facet disease contributing to mild spinal and moderate bilateral lateral recess stenosis. L3-4: Bulging annulus and advanced facet disease with mild spinal and bilateral lateral recess stenosis. No significant foraminal stenosis. L4-5: Bilateral pars defects with grade 1-2 anterolisthesis of L4. There is a bulging uncovered disc and significant inflate reactive changes. No significant spinal stenosis. There is significant bilateral foraminal stenosis. L5-S1: No significant findings. IMPRESSION: 1. Stable bilateral pars defects at L4 with a grade 1-2 spondylolisthesis and associated advanced degenerative disc disease and facet disease at L4-5. 2. Significant bilateral foraminal stenosis at L4-5. 3. Mild spinal and bilateral lateral recess stenosis at L2-3 and L3-4. 4. Mild bilateral lateral recess stenosis at L1-2. 5. No significant thoracic disc protrusions or significant spinal stenosis. 6. Grossly normal MR appearance of the thoracic spinal cord. Electronically Signed   By: Marijo Sanes M.D.   On: 04/28/2021 19:45    Procedures Procedures    Medications Ordered in ED Medications - No  data to display  ED Course/ Medical Decision Making/ A&P                           Medical Decision Making Problems Addressed: Acquired bilateral foot drop: chronic illness or injury with exacerbation, progression, or side effects of treatment DDD (degenerative disc disease), cervical: chronic illness or injury with exacerbation, progression, or side effects of treatment Gait disturbance: chronic illness or injury with exacerbation, progression, or side effects of treatment Myelopathy ([place]): chronic illness or injury with exacerbation, progression, or side effects of treatment that poses a threat to life or bodily functions Spinal stenosis in cervical region: chronic illness or injury with exacerbation, progression, or side effects of treatment Thrombocytopenia ([place]): chronic illness or injury  Amount and/or Complexity of Data Reviewed Independent Historian:     Details: family, pcp office, additional hx External Data Reviewed: notes. Labs: ordered. Decision-making details documented in ED Course. Radiology: ordered and independent interpretation performed. Decision-making details documented in ED Course. ECG/medicine tests: ordered and independent interpretation performed. Decision-making details documented in ED Course. Discussion of management or test interpretation with external provider(s): NS/hospitalists - discussed pt/imaging.   Risk Prescription drug management. Decision regarding hospitalization.   Labs and imaging ordered.   Reviewed nursing notes and prior charts for additional history.  Additional hx from family member.  [facility] chart reviewed.   Labs reviewed/interpreted by me - wbc and hgb normal.   CXR reviewed/interpreted by me - no pna  CT reviewed/interpreted by me - no hem.   MR reviewed/interpreted by me - c spine w stenosis, myelopathy.  Cardiac monitor: sinus rhythm, rate 60.   NS consulted - pt and imaging discussed. Discussed pt with Dr Arnoldo Morale - he  reviewed MRs and rec admit to medicine, PT, he will have Dr Ellene Route to see tomorrow to discuss if any tx/surgical options or not.   Hospitalists consulted for admission.             Final Clinical Impression(s) / ED Diagnoses Final diagnoses:  None    Rx / DC Orders ED Discharge Orders     None  Lajean Saver, MD 04/28/21 2039

## 2021-04-28 NOTE — Assessment & Plan Note (Addendum)
Stable and asymptomatic.  HR ranges from 58-76. No chest pain

## 2021-04-28 NOTE — ED Provider Triage Note (Signed)
Emergency Medicine Provider Triage Evaluation Note  Terry Gray , a 88 y.o. male  was evaluated in triage.  Pt complains of flulike illness and weakness to bilateral lower extremities.  Patient reports that he has had weakness to bilateral lower extremities over the last 14 days.  Patient states that he is having difficulty walking and has fallen multiple times due to this.  Patient also endorses numbness to bilateral lower extremities as well.  Patient was seen by his PCP earlier today and sent to the emergency department for further evaluation.  Patient reports that he is having urinary hesitancy and straining to urinate.  Denies any headache, visual disturbance, facial asymmetry, dysarthria, saddle anesthesia, back pain.  Patient does report that he has a history of lumbar back problems and compressed nerves.  Patient endorses chills, rhinorrhea, nasal congestion, and productive cough over the last 10 to 14 days.  Is concerned that he may have the flu.  Denies any fever.  Now 65  Review of Systems  Positive: Numbness, weakness, chills, rhinorrhea, nasal congestion, productive cough Negative: See above  Physical Exam  BP (!) 160/68 (BP Location: Left Arm)    Pulse (!) 58    Temp 98.6 F (37 C) (Oral)    Resp 18    SpO2 95%  Gen:   Awake, no distress   Resp:  Normal effort  MSK:   Moves extremities without difficulty; no midline tenderness or deformity to cervical, thoracic, lumbar spine. Other:  CN II through XII intact.  Sensation grossly intact to bilateral upper and lower extremities.  Patient has weakness to bilateral lower extremities with weakness greater in right lower leg.  Medical Decision Making  Medically screening exam initiated at 3:26 PM.  Appropriate orders placed.  Terry Gray was informed that the remainder of the evaluation will be completed by another provider, this initial triage assessment does not replace that evaluation, and the importance of remaining in the ED  until their evaluation is complete.     Loni Beckwith, [place] 04/28/21 1528

## 2021-04-28 NOTE — H&P (Signed)
History and Physical    Patient: Terry Gray BDZ:[id number] DOB: [birth year] DOA: 04/28/2021 DOS: the patient was seen and examined on 04/28/2021 PCP: Georgann Housekeeper, MD  Patient coming from: Home  Chief Complaint:  Chief Complaint  Patient presents with   Weakness    HPI: Terry Gray is a 88 y.o. male with medical history significant of HTN, HLD who presents for evaluation of weakness in his legs for the last year.  He reports has been getting worse over the last few months.  He has been able to ambulate with his walker up until a few weeks ago.  He reports that both of his feet drop and he has to drag them when he tries to walk.  He has had multiple falls at home but he has not had any head trauma or loss of consciousness.  He denies any injury to his hips or shoulders with the falls.  He does have a history of lumbar degenerative disc disease and saw neurosurgery in the past but no surgical intervention was attempted.  He reports having some numbness in his fingers and hands bilaterally as well as numbness/tingling in his feet bilaterally is been present for over a year and is unchanged.  He went to see his PCP today with his falls and not able to ambulate and was sent to the hospital for further evaluation.  He denies any weight loss, headache, fever, chills, chest pain, palpitations, shortness of breath, cough, urinary symptoms.  He has not had any seizure activity, facial droop or visual change. Denies tobacco alcohol or illicit drug use ER physician discussed with neurosurgery who will evaluate patient in the morning.  Patient had MRIs of his cervical, thoracic and lumbar spine which showed severe spinal stenosis in the cervical region of C5-C7 as well as degenerative disc disease in the cervical spine.  He also has large degenerative disc disease.  Hospitalist service asked to admit for further management  Review of Systems: As mentioned in the history of present illness. All other  systems reviewed and are negative. Past Medical History:  Diagnosis Date   Chronic lower back pain    Complication of anesthesia    "blood pressure dropped low in dentist office during dental work"   DOE (dyspnea on exertion)    Dysrhythmia    Hx. intermittent Atrial Fibrilllation   GERD (gastroesophageal reflux disease)    Hepatitis B ~ 2013   "suspected; [facility]"   High cholesterol    Hypertension    New onset atrial fibrillation (HCC) 07/05/2013   Shortness of breath    Stroke (HCC) ~ 2012   residual right side decreased sensation   TIA (transient ischemic attack) 2012   of unknown origin/notes 07/05/2013; "I've had 3 or 4; call it stroke sometimes; TIA sometimes"   Vertigo 07/05/2013   Past Surgical History:  Procedure Laterality Date   CAPSULOTOMY  04/03/2012   Procedure: MINOR CAPSULOTOMY;  Surgeon: Vita Erm., MD;  Location: [facility] OR;  Service: Ophthalmology;  Laterality: Right;   CATARACT EXTRACTION W/PHACO  02/01/2012   Procedure: CATARACT EXTRACTION PHACO AND INTRAOCULAR LENS PLACEMENT (IOC);  Surgeon: Shade Flood, MD;  Location: [facility] OR;  Service: Ophthalmology;  Laterality: Right;   COLONOSCOPY WITH PROPOFOL N/A 11/28/2013   Procedure: COLONOSCOPY WITH PROPOFOL;  Surgeon: Charolett Bumpers, MD;  Location: [facility] ENDOSCOPY;  Service: Endoscopy;  Laterality: N/A;   ESOPHAGOGASTRODUODENOSCOPY (EGD) WITH PROPOFOL N/A 11/28/2013   Procedure: ESOPHAGOGASTRODUODENOSCOPY (EGD) WITH  PROPOFOL;  Surgeon: Charolett Bumpers, MD;  Location: [facility];  Service: Endoscopy;  Laterality: N/A;   EYE SURGERY     YAG LASER APPLICATION  04/03/2012   Procedure: YAG LASER APPLICATION;  Surgeon: Vita Erm., MD;  Location: [facility] OR;  Service: Ophthalmology;  Laterality: N/A;   Social History:  reports that he has quit smoking. His smoking use included cigarettes. He has never used smokeless tobacco. He reports current alcohol use. He reports that he does not use  drugs.  No Known Allergies  History reviewed. No pertinent family history.  Prior to Admission medications   Medication Sig Start Date End Date Taking? Authorizing Provider  aspirin EC 81 MG tablet Take 81 mg by mouth daily.    [provider]  atorvastatin (LIPITOR) 20 MG tablet Take 10 mg by mouth daily.    [provider]  Cholecalciferol (VITAMIN D) 2000 UNITS CAPS Take 2,000 Units by mouth daily.     [provider]  clopidogrel (PLAVIX) 75 MG tablet Take 75 mg by mouth every morning.    [provider]  famotidine (PEPCID) 40 MG tablet Take 40 mg by mouth daily.    [provider]  finasteride (PROSCAR) 5 MG tablet Take 5 mg by mouth every morning.     [provider]  Omega 3-6-9 Fatty Acids (TRIPLE OMEGA-3-6-9) CAPS Take 1 capsule by mouth daily.    [provider]  Psyllium (METAMUCIL) 48.57 % POWD Take 1 scoop by mouth daily.     [provider]  ramipril (ALTACE) 10 MG capsule Take 10 mg by mouth every morning.  05/23/13   [provider]  vitamin C (ASCORBIC ACID) 500 MG tablet Take 500 mg by mouth daily.    [provider]    Physical Exam: Vitals:   04/28/21 1509 04/28/21 2000 04/28/21 2001  BP: (!) 160/68 129/69 132/65  Pulse: (!) 58  76  Resp: 18  16  Temp: 98.6 F (37 C)    TempSrc: Oral    SpO2: 95%  98%   General: WDWN, Alert and oriented x3.  HENT:  [place]/AT, external ears normal.  Nares patent without epistasis.  Mucous membranes are moist.  Neck: Soft, normal range of motion, supple, no masses, Trachea midline Respiratory: clear to auscultation bilaterally, no wheezing, no crackles. Normal respiratory effort. No accessory muscle use.  Cardiovascular: Regular rhythm, and rate. no murmurs / rubs / gallops. Mild lower extremity edema. 1+ pedal pulses.  Abdomen: Soft, no tenderness, nondistended, no rebound or guarding.  Bowel sounds normoactive Musculoskeletal: FROM upper  extremities. LROM lower extremities due to weakness. no cyanosis. No joint deformity upper and lower extremities. Bilateral foot drop. Limited extension and flexion ankles bilaterally Skin: Warm, dry, intact no rashes, lesions, ulcers. No induration Neurologic:  CN 2-12 intact. Speech normal. No facial droop. No tremor Psychiatric: Normal judgment and insight.  Normal mood.    Data Reviewed: Lab work:   BBC unremarkable.  Sodium 134 potassium 4.6 chloride 100 bicarb 28 glucose 101 creatinine 0.86 BUN 7 calcium 8.8 alkaline phosphatase 44 AST 21 ALT 19 albumin 3.6 bilirubin 0.3 COVID-negative    Influenza A and B negative  EKG shows sinus bradycardia specific ST changes.  No acute ST elevation or depression.  QTc 373  Assessment and Plan: * Cervical stenosis of spinal canal- (present on admission) Mr. Murfin is admitted to Med Surg floor Neurosurgery consulted by ER physician and will see pt in am  and make recommendations.   Foot drop, bilateral PT to evaluate Neurosurgery consulted  DDD (degenerative disc disease), lumbar Chronic. Neurosurgery consulted  Essential hypertension- (present on admission) Continue Altace. Monitor BP  Sinus bradycardia Stable and asymptomatic.  HR ranges from 58-76. No chest pain  Advance Care Planning:  Code Status:  Full Code.  Padua score elevated with immobility. Lovenox for DVT prophylaxis  Consults: Neurosurgery  Family Communication: Diagnosis and plan discussed with patient and his son is at the bedside.  They both verbalized understanding and agree with plan.  Further recommendation follow as clinical indicated questions answered  Author: Carlton Adam, MD 04/28/2021 9:45 PM  For on call review [email or web address].

## 2021-04-28 NOTE — Assessment & Plan Note (Signed)
Terry Gray is admitted to Med Surg floor Neurosurgery consulted by ER physician and will see pt in am and make recommendations.

## 2021-04-28 NOTE — Assessment & Plan Note (Signed)
Continue Altace. Monitor BP

## 2021-04-28 NOTE — ED Notes (Signed)
Patient transported to MRI 

## 2021-04-29 ENCOUNTER — Encounter (HOSPITAL_COMMUNITY): Payer: Self-pay | Admitting: Family Medicine

## 2021-04-29 LAB — CBC
HCT: 39 % (ref 39.0–52.0)
Hemoglobin: 12.9 g/dL — ABNORMAL LOW (ref 13.0–17.0)
MCH: 28.2 pg (ref 26.0–34.0)
MCHC: 33.1 g/dL (ref 30.0–36.0)
MCV: 85.2 fL (ref 80.0–100.0)
Platelets: 115 10*3/uL — ABNORMAL LOW (ref 150–400)
RBC: 4.58 MIL/uL (ref 4.22–5.81)
RDW: 14 % (ref 11.5–15.5)
WBC: 5.7 10*3/uL (ref 4.0–10.5)
nRBC: 0 % (ref 0.0–0.2)

## 2021-04-29 LAB — BASIC METABOLIC PANEL
Anion gap: 7 (ref 5–15)
BUN: 8 mg/dL (ref 8–23)
CO2: 26 mmol/L (ref 22–32)
Calcium: 8.6 mg/dL — ABNORMAL LOW (ref 8.9–10.3)
Chloride: 106 mmol/L (ref 98–111)
Creatinine, Ser: 0.83 mg/dL (ref 0.61–1.24)
GFR, Estimated: >60 mL/min (ref >60)
Glucose, Bld: 98 mg/dL (ref 70–99)
Potassium: 3.9 mmol/L (ref 3.5–5.1)
Sodium: 139 mmol/L (ref 135–145)

## 2021-04-29 LAB — SURGICAL PCR SCREEN
MRSA, PCR: NEGATIVE
Staphylococcus aureus: NEGATIVE

## 2021-04-29 NOTE — Progress Notes (Signed)
Chief Complaint:  Chief Complaint  Patient presents with   Weakness    HPI: Terry [name] is a 88 y.o. male with medical history significant of HTN, HLD who presents for evaluation of weakness in his legs for the last year.  He reports has been getting worse over the last few months.  He has been able to ambulate with his walker up until a few weeks ago.  He reports that both of his feet drop and he has to drag them when he tries to walk.  He has had multiple falls at home but he has not had any head trauma or loss of consciousness.  He denies any injury to his hips or shoulders with the falls.  He does have a history of lumbar degenerative disc disease and saw neurosurgery in the past but no surgical intervention was attempted.  He reports having some numbness in his fingers and hands bilaterally as well as numbness/tingling in his feet bilaterally is been present for over a year and is unchanged.  He went to see his PCP today with his falls and not able to ambulate and was sent to the hospital for further evaluation.  He denies any weight loss, headache, fever, chills, chest pain, palpitations, shortness of breath, cough, urinary symptoms.  He has not had any seizure activity, facial droop or visual change. Denies tobacco alcohol or illicit drug use ER physician discussed with neurosurgery who will evaluate patient in the morning.  Patient had MRIs of his cervical, thoracic and lumbar spine which showed severe spinal stenosis in the cervical region of C5-C7 as well as degenerative disc disease in the cervical spine.  He also has large degenerative disc disease.  Hospitalist service asked to admit for further management      Physical Exam: Vitals:   04/29/21 0212 04/29/21 0329 04/29/21 0334 04/29/21 0819  BP: (!) 157/77 137/69  (!) 134/59  Pulse: 66 (!) 57  63  Resp:    18  Temp: 98.1 F (36.7 C) 98.1 F (36.7 C)  98.1 F (36.7 C)  TempSrc: Oral Oral    SpO2: 98% 97%  96%  Weight:    67.6 kg   Height:   4\' 10"  (1.473 m)    General: WDWN, Alert and oriented x3.  HENT:  [place]/AT, external ears normal.  Nares patent without epistasis.  Mucous membranes are moist.  Neck: Soft, normal range of motion, supple, no masses, Trachea midline Respiratory: clear to auscultation bilaterally, no wheezing, no crackles. Normal respiratory effort. No accessory muscle use.  Cardiovascular: Regular rhythm, and rate. no murmurs / rubs / gallops. Mild lower extremity edema. 1+ pedal pulses.  Abdomen: Soft, no tenderness, nondistended, no rebound or guarding.  Bowel sounds normoactive Musculoskeletal: FROM upper extremities. LROM lower extremities due to weakness. no cyanosis. No joint deformity upper and lower extremities. Bilateral foot drop. Limited extension and flexion ankles bilaterally Skin: Warm, dry, intact no rashes, lesions, ulcers. No induration Neurologic:  CN 2-12 intact. Speech normal. No facial droop. No tremor Psychiatric: Normal judgment and insight.  Normal mood.    Data Reviewed: Lab work:   BBC unremarkable.  Sodium 134 potassium 4.6 chloride 100 bicarb 28 glucose 101 creatinine 0.86 BUN 7 calcium 8.8 alkaline phosphatase 44 AST 21 ALT 19 albumin 3.6 bilirubin 0.3 COVID-negative    Influenza A and B negative  EKG shows sinus bradycardia specific ST changes.  No acute ST elevation or depression.  QTc 373  Assessment and Plan: *  Cervical stenosis of spinal canal- (present on admission) Terry [name] is admitted to Med Surg floor Awaiting neurosurgery evaluation and recommendations  DDD (degenerative disc disease), lumbar Chronic. Neurosurgery consulted  Foot drop, bilateral PT to evaluate Neurosurgery consulted  Sinus bradycardia Stable and asymptomatic.  HR ranges from 58-76. No chest pain  Essential hypertension- (present on admission) Continue Altace. Monitor BP  Consults: Neurosurgery  Family Communication: Diagnosis and plan discussed with patient and his son  is at the bedside.  They both verbalized understanding and agree with plan.  Further recommendation follow as clinical indicated questions answered  Author: Haydee Monica, MD 04/29/2021 9:33 AM

## 2021-04-29 NOTE — TOC Initial Note (Signed)
Transition of Care [facility]) - Initial/Assessment Note    Patient Details  Name: Terry [name] MRN: [id number] Date of Birth: 11/06/1933  [facility] CM/SW Contact:    Beckie Busing, RN Phone Number:[phone]  04/29/2021, 10:23 AM  Clinical Narrative:                  Transition of Care [facility]) Screening Note   Patient Details  Name: Terry [name] Date of Birth: May 13, 1933   [facility] of [facility]) CM/SW Contact:    Beckie Busing, RN Phone Number: 04/29/2021, 10:24 AM    Transition of Care Department (TOC) has reviewed patient and no TOC needs have been identified at this time. We will continue to monitor patient advancement through interdisciplinary progression rounds. If new patient transition needs arise, please place a TOC consult.          Patient Goals and CMS Choice        Expected Discharge Plan and Services                                                Prior Living Arrangements/Services                       Activities of Daily Living Home Assistive Devices/Equipment: Eyeglasses ADL Screening (condition at time of admission) Patient's cognitive ability adequate to safely complete daily activities?: Yes Is the patient deaf or have difficulty hearing?: No Does the patient have difficulty seeing, even when wearing glasses/contacts?: No Does the patient have difficulty concentrating, remembering, or making decisions?: No Patient able to express need for assistance with ADLs?: Yes Does the patient have difficulty dressing or bathing?: No Independently performs ADLs?: No Communication: Independent Dressing (OT): Needs assistance Is this a change from baseline?: Change from baseline, expected to last >3 days Grooming: Needs assistance Is this a change from baseline?: Change from baseline, expected to last >3 days Feeding: Independent Bathing: Needs assistance Is this a change from baseline?: Change from baseline,  expected to last >3 days Toileting: Needs assistance Is this a change from baseline?: Change from baseline, expected to last >3days In/Out Bed: Needs assistance Is this a change from baseline?: Change from baseline, expected to last >3 days Walks in Home: Needs assistance Is this a change from baseline?: Change from baseline, expected to last >3 days Does the patient have difficulty walking or climbing stairs?: Yes Weakness of Legs: Both Weakness of Arms/Hands: None  Permission Sought/Granted                  Emotional Assessment              Admission diagnosis:  Spinal stenosis in cervical region [M48.02] Gait disturbance [R26.9] Myelopathy (HCC) [G95.9] Thrombocytopenia (HCC) [D69.6] DDD (degenerative disc disease), cervical [M50.30] Cervical stenosis of spinal canal [M48.02] Acquired bilateral foot drop [M21.371, M21.372] Patient Active Problem List   Diagnosis Date Noted   Cervical stenosis of spinal canal 04/28/2021   Sinus bradycardia 04/28/2021   Foot drop, bilateral 04/28/2021   DDD (degenerative disc disease), lumbar 04/28/2021   Vertigo 07/05/2013   TIA (transient ischemic attack) 07/05/2013   Chest pain 06/10/2013   Dyspnea 06/10/2013   Essential hypertension 06/10/2013   Elevated lipids 06/10/2013   PCP:  Georgann Housekeeper, MD Pharmacy:   [id number] -  [place], [place] - [address] AT [facility] [address] [place] [address] Phone: [phone] Fax: [phone]     Social Determinants of Health (SDOH) Interventions    Readmission Risk Interventions No flowsheet data found.

## 2021-04-29 NOTE — Consult Note (Signed)
Reason for Consult: Cervical myelopathy Referring Physician: Dr. Hanley Seamenavid  [place] Terry [name] Terry [name] is an 88 y.o. male.  HPI: The patient is an 88 year old [place] immigrant who has had some chronic back problems.  In fact he saw Dr. Danielle DessElsner, my partner, in 2011 secondary to a grade 2 spondylolisthesis at L4-5.  He did not require surgery at that time.  Patient was admitted last night after progressive difficulty ambulating and bilateral foot drops.  He was worked up with a CT scan of his brain which was unremarkable.  He went on to have a cervical thoracic and lumbar MRI which demonstrated severe stenosis in the cervical spine with more moderate stenosis in the thoracic and lumbar spine and is known old spondylolisthesis.  The patient was admitted for further planning.  Presently the patient is accompanied by his son and daughter-in-law and I speak with his daughter via video phone.  He tells me that does not have any significant neck or back pain.  He tells me he has taken dozens of falls over the last year.  He has progressed from walking with a cane to a walker and now is barely able to walk.  He has bilateral foot drops.  He admits to bilateral hand weakness and numbness.  He is presently on aspirin, fish oil, tumeric, and possibly Plavix although he denies the Plavix.  Past Medical History:  Diagnosis Date   Chronic lower back pain    Complication of anesthesia    "blood pressure dropped low in dentist office during dental work"   DOE (dyspnea on exertion)    Dysrhythmia    Hx. intermittent Atrial Fibrilllation   GERD (gastroesophageal reflux disease)    Hepatitis B ~ 2013   "suspected; [facility]"   High cholesterol    Hypertension    New onset atrial fibrillation (HCC) 07/05/2013   Shortness of breath    Stroke (HCC) ~ 2012   residual right side decreased sensation   TIA (transient ischemic attack) 2012   of unknown origin/notes 07/05/2013; "I've had 3 or 4; call it stroke  sometimes; TIA sometimes"   Vertigo 07/05/2013    Past Surgical History:  Procedure Laterality Date   CAPSULOTOMY  04/03/2012   Procedure: MINOR CAPSULOTOMY;  Surgeon: Vita Ermhomas E Brewington Jr., MD;  [facility];  Service: Ophthalmology;  Laterality: Right;   CATARACT EXTRACTION W/PHACO  02/01/2012   Procedure: CATARACT EXTRACTION PHACO AND INTRAOCULAR LENS PLACEMENT (IOC);  Surgeon: Shade FloodGreer Geiger, MD;  [facility];  Service: Ophthalmology;  Laterality: Right;   COLONOSCOPY WITH PROPOFOL N/A 11/28/2013   Procedure: COLONOSCOPY WITH PROPOFOL;  Surgeon: Charolett BumpersMartin K Johnson, MD;  Location: [facility] ENDOSCOPY;  Service: Endoscopy;  Laterality: N/A;   ESOPHAGOGASTRODUODENOSCOPY (EGD) WITH PROPOFOL N/A 11/28/2013   Procedure: ESOPHAGOGASTRODUODENOSCOPY (EGD) WITH PROPOFOL;  Surgeon: Charolett BumpersMartin K Johnson, MD;  Location: [facility] ENDOSCOPY;  Service: Endoscopy;  Laterality: N/A;   EYE SURGERY     YAG LASER APPLICATION  04/03/2012   Procedure: YAG LASER APPLICATION;  Surgeon: Vita Ermhomas E Brewington Jr., MD;  [facility];  Service: Ophthalmology;  Laterality: N/A;    History reviewed. No pertinent family history.  Social History:  reports that he has quit smoking. His smoking use included cigarettes. He has never used smokeless tobacco. He reports current alcohol use. He reports that he does not use drugs.  Allergies: No Known Allergies  Medications: I have reviewed the patient's current medications. Prior to Admission:  Medications Prior to Admission  Medication  Sig Dispense Refill Last Dose   aspirin EC 81 MG tablet Take 81 mg by mouth daily.   04/28/2021   atorvastatin (LIPITOR) 10 MG tablet Take 10 mg by mouth every evening.   04/28/2021   docusate sodium (COLACE) 100 MG capsule Take 100 mg by mouth daily.   04/28/2021   finasteride (PROSCAR) 5 MG tablet Take 5 mg by mouth daily.   04/28/2021   Omega 3-6-9 Fatty Acids (TRIPLE OMEGA-3-6-9) CAPS Take 1 capsule by mouth daily.   04/28/2021   Psyllium 48.57 % POWD  Take 1 scoop by mouth daily.    04/28/2021   ramipril (ALTACE) 10 MG capsule Take 10 mg by mouth every morning.    04/28/2021   Vitamin D, Ergocalciferol, (DRISDOL) 1.25 MG (50000 UNIT) CAPS capsule Take 50,000 Units by mouth 2 (two) times a week.   Past Week   sildenafil (VIAGRA) 50 MG tablet Take 50 mg by mouth daily as needed for erectile dysfunction.   unk   Scheduled:  aspirin EC  81 mg Oral Daily   atorvastatin  10 mg Oral Daily   enoxaparin (LOVENOX) injection  40 mg Subcutaneous Q24H   finasteride  5 mg Oral q morning   ramipril  10 mg Oral q morning   Continuous: UJW:[id number] **OR** acetaminophen, ondansetron **OR** ondansetron (ZOFRAN) IV, senna-docusate Anti-infectives (From admission, onward)    None        Results for orders placed or performed during the hospital encounter of 04/28/21 (from the past 48 hour(s))  Comprehensive metabolic panel     Status: Abnormal   Collection Time: 04/28/21  3:26 PM  Result Value Ref Range   Sodium 134 (L) 135 - 145 mmol/L   Potassium 4.6 3.5 - 5.1 mmol/L   Chloride 100 98 - 111 mmol/L   CO2 28 22 - 32 mmol/L   Glucose, Bld 101 (H) 70 - 99 mg/dL    Comment: Glucose reference range applies only to samples taken after fasting for at least 8 hours.   BUN 7 (L) 8 - 23 mg/dL   Creatinine, Ser 3.08 0.61 - 1.24 mg/dL   Calcium 8.8 (L) 8.9 - 10.3 mg/dL   Total Protein 6.7 6.5 - 8.1 g/dL   Albumin 3.6 3.5 - 5.0 g/dL   AST 21 15 - 41 U/L   ALT 19 0 - 44 U/L   Alkaline Phosphatase 44 38 - 126 U/L   Total Bilirubin 0.3 0.3 - 1.2 mg/dL   GFR, Estimated >65 >78 mL/min    Comment: (NOTE) Calculated using the CKD-EPI Creatinine Equation (2021)    Anion gap 6 5 - 15    Comment: Performed at [facility], [address] [place], [place] [address]  CBC with Differential     Status: Abnormal   Collection Time: 04/28/21  3:26 PM  Result Value Ref Range   WBC 5.9 4.0 - 10.5 K/uL   RBC 4.85 4.22 - 5.81 MIL/uL   Hemoglobin 13.3 13.0  - 17.0 g/dL   HCT 95.2 84.1 - 32.4 %   MCV 88.0 80.0 - 100.0 fL   MCH 27.4 26.0 - 34.0 pg   MCHC 31.1 30.0 - 36.0 g/dL   RDW 40.1 02.7 - 25.3 %   Platelets 122 (L) 150 - 400 K/uL    Comment: REPEATED TO VERIFY   nRBC 0.0 0.0 - 0.2 %   Neutrophils Relative % 57 %   Neutro Abs 3.3 1.7 - 7.7 K/uL  Lymphocytes Relative 29 %   Lymphs Abs 1.7 0.7 - 4.0 K/uL   Monocytes Relative 9 %   Monocytes Absolute 0.5 0.1 - 1.0 K/uL   Eosinophils Relative 5 %   Eosinophils Absolute 0.3 0.0 - 0.5 K/uL   Basophils Relative 0 %   Basophils Absolute 0.0 0.0 - 0.1 K/uL   Immature Granulocytes 0 %   Abs Immature Granulocytes 0.01 0.00 - 0.07 K/uL    Comment: Performed at [facility], [address], [place], [place] [address]  Resp Panel by RT-PCR (Flu A&B, Covid) Nasopharyngeal Swab     Status: None   Collection Time: 04/28/21  6:14 PM   Specimen: Nasopharyngeal Swab; Nasopharyngeal(NP) swabs in vial transport medium  Result Value Ref Range   SARS Coronavirus 2 by RT PCR NEGATIVE NEGATIVE    Comment: (NOTE) SARS-CoV-2 target nucleic acids are NOT DETECTED.  The SARS-CoV-2 RNA is generally detectable in upper respiratory specimens during the acute phase of infection. The lowest concentration of SARS-CoV-2 viral copies this assay can detect is 138 copies/mL. A negative result does not preclude SARS-Cov-2 infection and should not be used as the sole basis for treatment or other patient management decisions. A negative result may occur with  improper specimen collection/handling, submission of specimen other than nasopharyngeal swab, presence of viral mutation(s) within the areas targeted by this assay, and inadequate number of viral copies(<138 copies/mL). A negative result must be combined with clinical observations, patient history, and epidemiological information. The expected result is Negative.  Fact Sheet for Patients:  BloggerCourse.com  Fact Sheet for  Healthcare Providers:  SeriousBroker.it  This test is no t yet approved or cleared by the [place] FDA and  has been authorized for detection and/or diagnosis of SARS-CoV-2 by FDA under an Emergency Use Authorization (EUA). This EUA will remain  in effect (meaning this test can be used) for the duration of the COVID-19 declaration under Section 564(b)(1) of the Act, 21 U.S.C.section 360bbb-3(b)(1), unless the authorization is terminated  or revoked sooner.       Influenza A by PCR NEGATIVE NEGATIVE   Influenza B by PCR NEGATIVE NEGATIVE    Comment: (NOTE) The Xpert Xpress SARS-CoV-2/FLU/RSV plus assay is intended as an aid in the diagnosis of influenza from Nasopharyngeal swab specimens and should not be used as a sole basis for treatment. Nasal washings and aspirates are unacceptable for Xpert Xpress SARS-CoV-2/FLU/RSV testing.  Fact Sheet for Patients: BloggerCourse.com  Fact Sheet for Healthcare Providers: SeriousBroker.it  This test is not yet approved or cleared by the [place] FDA and has been authorized for detection and/or diagnosis of SARS-CoV-2 by FDA under an Emergency Use Authorization (EUA). This EUA will remain in effect (meaning this test can be used) for the duration of the COVID-19 declaration under Section 564(b)(1) of the Act, 21 U.S.C. section 360bbb-3(b)(1), unless the authorization is terminated or revoked.  Performed at [facility], [address] [place], [place] [address]   TSH     Status: None   Collection Time: 04/28/21  7:58 PM  Result Value Ref Range   TSH 1.276 0.350 - 4.500 uIU/mL    Comment: Performed by a 3rd Generation assay with a functional sensitivity of <=0.01 uIU/mL. Performed at [facility], [address], [place], [place] [address]   T4, free     Status: None   Collection Time: 04/28/21  7:58 PM  Result Value Ref Range   Free T4  1.02 0.61 - 1.12 ng/dL    Comment: (NOTE) Biotin ingestion may interfere with free T4 tests. If the results are inconsistent with the TSH level, previous test results, or the clinical presentation, then consider biotin interference. If needed, order repeat testing after stopping biotin. Performed at [facility], [address], [place], [place] [address]   Surgical pcr screen     Status: None   Collection Time: 04/29/21  2:30 AM   Specimen: Nasal Mucosa; Nasal Swab  Result Value Ref Range   MRSA, PCR NEGATIVE NEGATIVE   Staphylococcus aureus NEGATIVE NEGATIVE    Comment: (NOTE) The Xpert SA Assay (FDA approved for NASAL specimens in patients 73 years of age and older), is one component of a comprehensive surveillance program. It is not intended to diagnose infection nor to guide or monitor treatment. Performed at [facility], [address], [place], [place] [address]   Basic metabolic panel     Status: Abnormal   Collection Time: 04/29/21  6:02 AM  Result Value Ref Range   Sodium 139 135 - 145 mmol/L   Potassium 3.9 3.5 - 5.1 mmol/L   Chloride 106 98 - 111 mmol/L   CO2 26 22 - 32 mmol/L   Glucose, Bld 98 70 - 99 mg/dL    Comment: Glucose reference range applies only to samples taken after fasting for at least 8 hours.   BUN 8 8 - 23 mg/dL   Creatinine, Ser 1.02 0.61 - 1.24 mg/dL   Calcium 8.6 (L) 8.9 - 10.3 mg/dL   GFR, Estimated >58 >52 mL/min    Comment: (NOTE) Calculated using the CKD-EPI Creatinine Equation (2021)    Anion gap 7 5 - 15    Comment: Performed at [facility], [address], [place], [place] [address]  CBC     Status: Abnormal   Collection Time: 04/29/21  6:02 AM  Result Value Ref Range   WBC 5.7 4.0 - 10.5 K/uL   RBC 4.58 4.22 - 5.81 MIL/uL   Hemoglobin 12.9 (L) 13.0 - 17.0 g/dL   HCT 23.5 36.1 - 44.3 %   MCV 85.2 80.0 - 100.0 fL   MCH 28.2 26.0 - 34.0 pg   MCHC 33.1 30.0 - 36.0 g/dL   RDW 15.4 00.8 - 67.6 %   Platelets 115  (L) 150 - 400 K/uL    Comment: Immature Platelet Fraction may be clinically indicated, consider ordering this additional test [id number] REPEATED TO VERIFY PLATELET COUNT CONFIRMED BY SMEAR    nRBC 0.0 0.0 - 0.2 %    Comment: Performed at [facility], [address] [place], [place] [address]    DG Chest 2 View  Result Date: 04/28/2021 CLINICAL DATA:  Cough EXAM: CHEST - 2 VIEW COMPARISON:  07/05/2013 FINDINGS: Cardiac size is within normal limits. There are no signs of pulmonary edema or focal pulmonary consolidation. Small linear density in the lateral right lower lung fields may suggest minimal scarring. There is no pleural effusion or pneumothorax. IMPRESSION: No active cardiopulmonary disease. Electronically Signed   By: Ernie Avena M.D.   On: 04/28/2021 15:56   CT HEAD WO CONTRAST ( )  Result Date: 04/28/2021 CLINICAL DATA:  A male at age 21 presents with weakness of bilateral lower extremities that began 2 weeks ago. EXAM: CT HEAD WITHOUT CONTRAST TECHNIQUE: Contiguous axial images were obtained from the base of the skull through the vertex without intravenous contrast. RADIATION DOSE REDUCTION: This exam  was performed according to the departmental dose-optimization program which includes automated exposure control, adjustment of the mA and/or kV according to patient size and/or use of iterative reconstruction technique. COMPARISON:  May of 2015. FINDINGS: Brain: No evidence of acute infarction, hemorrhage, hydrocephalus, extra-axial collection or mass lesion/mass effect. Signs of mild atrophy and chronic microvascular ischemic change similar to prior imaging. Vascular: No hyperdense vessel or unexpected calcification. Skull: Normal. Negative for fracture or focal lesion. Sinuses/Orbits: Visualized paranasal sinuses and orbits are unremarkable. Other: None. IMPRESSION: 1. No acute intracranial abnormality. 2. Signs of mild atrophy and chronic microvascular ischemic change  similar to prior imaging. Electronically Signed   By: Donzetta Kohut M.D.   On: 04/28/2021 15:56   MR Cervical Spine Wo Contrast  Result Date: 04/28/2021 CLINICAL DATA:  Myelopathy, bilateral foot drop, hand weakness EXAM: MRI CERVICAL SPINE WITHOUT CONTRAST TECHNIQUE: Multiplanar, multisequence MR imaging of the cervical spine was performed. No intravenous contrast was administered. COMPARISON:  None. FINDINGS: Evaluation is limited by poor study quality, in part secondary to motion. Alignment: Straightening of the normal cervical lordosis. Trace anterolisthesis C4 on C5. Trace retrolisthesis C5 on C6. Trace anterolisthesis C7 on T1 Vertebrae: Diffusely decreased marrow signal. No definite acute fracture. Congenitally short pedicles, which narrow the AP diameter of the spinal canal. 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 Posterior Fossa, vertebral arteries, paraspinal tissues: Degenerative pannus formation at C1. Disc levels: C1-C2: Pannus formation causes moderate spinal canal stenosis. Evaluation of the neural foramina is limited but there appears to be at least moderate stenosis of the right neural foramen. C2-C3: Moderate disc bulge. Severe left greater than right facet and uncovertebral hypertrophy. Moderate spinal canal stenosis. Severe left-greater-than-right neural foraminal narrowing. C3-C4: Moderate disc bulge. Facet and uncovertebral hypertrophy. Moderate spinal canal stenosis. Severe left and moderate right neural foraminal narrowing. C4-C5: Trace anterolisthesis. Large broad-based disc bulge with superimposed central protrusion or disc osteophyte. Severe spinal canal stenosis with compressive myelopathy. Uncovertebral and facet arthropathy. Moderate bilateral neural foraminal narrowing. C5-C6: Trace retrolisthesis with large disc osteophyte complex. Facet  and uncovertebral hypertrophy. Severe spinal canal stenosis with compressive myelopathy. Severe right and moderate left neural foraminal narrowing. C6-C7: Large disc bulge. Severe spinal canal stenosis with evidence of compressive myelopathy. Uncovertebral and facet arthropathy. Mild bilateral neural foraminal narrowing. C7-T1: Trace anterolisthesis with disc unroofing. Mild spinal canal stenosis. Facet arthropathy. No neural foraminal narrowing. IMPRESSION: 1. Evaluation is limited by poor study quality and motion artifact. 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 Additional less significant spinal canal stenosis (mild or moderate) at the other cervical levels. 2. Multilevel disc degenerative changes, facet arthropathy, and uncovertebral hypertrophy, which causes up to severe neural foraminal narrowing bilaterally at C2-C3, on the left at C3-C4, and on the right at C5-C6. 3. Diffusely decreased marrow signal, which is nonspecific but can be seen in the setting of bone marrow hyperplasia, iron deposition, renal osteodystrophy, or myeloproliferative disease. These results were called by telephone at the time of interpretation on 04/28/2021 at 7:25 pm to provider [facility] , who verbally acknowledged these results. Electronically Signed   By: Wiliam Ke M.D.   On: 04/28/2021 19:25   MR THORACIC SPINE WO CONTRAST  Result Date: 04/28/2021 CLINICAL  DATA:  Acute lumbar myelopathy.  Bilateral foot drop. EXAM: MRI THORACIC AND LUMBAR SPINE WITHOUT CONTRAST TECHNIQUE: Multiplanar and multiecho pulse sequences of the thoracic and lumbar spine were obtained without intravenous contrast. COMPARISON:  10/29/2010 FINDINGS: MRI THORACIC SPINE FINDINGS Alignment:  Normal Vertebrae: Patchy low T1 marrow signal intensity could be due to anemia, smoking or osteoporosis.  No focal bone lesions are identified. Cord: Grossly normal cord signal intensity. No cord lesions or syrinx. Paraspinal and other soft tissues: No significant paraspinal findings. Disc levels: Moderate degenerative changes in the thoracic spine with small disc protrusions and spurring but no significant canal stenosis. MRI LUMBAR SPINE FINDINGS Segmentation: There are five lumbar type vertebral bodies. The last full intervertebral disc space is labeled L5-S1. Alignment: Bilateral pars defects at L4 with a grade 1-2 spondylolisthesis which is unchanged. Vertebrae: Low T1 marrow signal could be due to anemia, smoking or osteoporosis. No focal bone lesions are identified. Marked endplate reactive changes at L4-5 with near Conus medullaris and cauda equina: Conus extends to the T12-L1 level. Conus and cauda equina appear normal. Paraspinal and other soft tissues: No significant paraspinal or retroperitoneal findings. Disc levels: T12-L1: No significant findings. L1-2: Bulging annulus and moderate facet disease with mild bilateral lateral recess stenosis but no significant spinal stenosis or foraminal stenosis. L2-3: Bulging degenerated annulus and shallow central disc protrusion in conjunction with facet disease contributing to mild spinal and moderate bilateral lateral recess stenosis. L3-4: Bulging annulus and advanced facet disease with mild spinal and bilateral lateral recess stenosis. No significant foraminal stenosis. L4-5: Bilateral pars defects with grade 1-2 anterolisthesis of L4. There is a bulging uncovered disc and significant inflate reactive changes. No significant spinal stenosis. There is significant bilateral foraminal stenosis. L5-S1: No significant findings. IMPRESSION: 1. Stable bilateral pars defects at L4 with a grade 1-2 spondylolisthesis and associated advanced degenerative disc disease and facet disease at L4-5. 2. Significant bilateral foraminal stenosis at L4-5. 3. Mild spinal and bilateral  lateral recess stenosis at L2-3 and L3-4. 4. Mild bilateral lateral recess stenosis at L1-2. 5. No significant thoracic disc protrusions or significant spinal stenosis. 6. Grossly normal MR appearance of the thoracic spinal cord. Electronically Signed   By: Rudie Meyer M.D.   On: 04/28/2021 19:45   MR LUMBAR SPINE WO CONTRAST  Result Date: 04/28/2021 CLINICAL DATA:  Acute lumbar myelopathy.  Bilateral foot drop. EXAM: MRI THORACIC AND LUMBAR SPINE WITHOUT CONTRAST TECHNIQUE: Multiplanar and multiecho pulse sequences of the thoracic and lumbar spine were obtained without intravenous contrast. COMPARISON:  10/29/2010 FINDINGS: MRI THORACIC SPINE FINDINGS Alignment:  Normal Vertebrae: Patchy low T1 marrow signal intensity could be due to anemia, smoking or osteoporosis. No focal bone lesions are identified. Cord: Grossly normal cord signal intensity. No cord lesions or syrinx. Paraspinal and other soft tissues: No significant paraspinal findings. Disc levels: Moderate degenerative changes in the thoracic spine with small disc protrusions and spurring but no significant canal stenosis. MRI LUMBAR SPINE FINDINGS Segmentation: There are five lumbar type vertebral bodies. The last full intervertebral disc space is labeled L5-S1. Alignment: Bilateral pars defects at L4 with a grade 1-2 spondylolisthesis which is unchanged. Vertebrae: Low T1 marrow signal could be due to anemia, smoking or osteoporosis. No focal bone lesions are identified. Marked endplate reactive changes at L4-5 with near Conus medullaris and cauda equina: Conus extends to the T12-L1 level. Conus and cauda equina appear normal. Paraspinal and other soft tissues: No significant paraspinal or retroperitoneal findings. Disc levels: T12-L1: No  significant findings. L1-2: Bulging annulus and moderate facet disease with mild bilateral lateral recess stenosis but no significant spinal stenosis or foraminal stenosis. L2-3: Bulging degenerated annulus and  shallow central disc protrusion in conjunction with facet disease contributing to mild spinal and moderate bilateral lateral recess stenosis. L3-4: Bulging annulus and advanced facet disease with mild spinal and bilateral lateral recess stenosis. No significant foraminal stenosis. L4-5: Bilateral pars defects with grade 1-2 anterolisthesis of L4. There is a bulging uncovered disc and significant inflate reactive changes. No significant spinal stenosis. There is significant bilateral foraminal stenosis. L5-S1: No significant findings. IMPRESSION: 1. Stable bilateral pars defects at L4 with a grade 1-2 spondylolisthesis and associated advanced degenerative disc disease and facet disease at L4-5. 2. Significant bilateral foraminal stenosis at L4-5. 3. Mild spinal and bilateral lateral recess stenosis at L2-3 and L3-4. 4. Mild bilateral lateral recess stenosis at L1-2. 5. No significant thoracic disc protrusions or significant spinal stenosis. 6. Grossly normal MR appearance of the thoracic spinal cord. Electronically Signed   By: Rudie Meyer M.D.   On: 04/28/2021 19:45    ROS: As above Blood pressure 133/74, pulse (!) 56, temperature 98.2 F (36.8 C), temperature source Oral, resp. rate 19, height 4\' 10"  (1.473 m), weight 67.6 kg, SpO2 98 %. Estimated body mass index is 31.15 kg/m as calculated from the following:   Height as of this encounter: 4\' 10"  (1.473 m).   Weight as of this encounter: 67.6 kg.  Physical Exam  General: An alert and pleasant 88 year old male in no apparent distress.  He has bilateral foot drops and bilateral hand intrinsic atrophy, right greater left.  HEENT: Normocephalic, extraocular muscles are intact  Neck: The patient has a limited cervical range of motion.  Spurling's testing is negative.  Lhermitte sign was not present  Thorax: Symmetric  Abdomen: Soft  Extremities: The patient has bilateral foot drops  Neurologic exam: The patient is alert and oriented  x3.  Cranial nerves II through XII were examined bilaterally and grossly normal.  Vision and hearing are grossly normal bilaterally.  The patient's motor strength is 5/5 in spinal bicep and tricep.  He has approximately 3/5 bilateral hand grip strength.  As above he has bilateral hand atrophy, worse on the right.  The patient has numbness in his bilateral hands and lower extremities.  He is hyperreflexic in his bilateral quadricep and gastrocnemius.  He has bilateral ankle clonus.  Imaging studies:  I reviewed the patient's head CT performed yesterday.  It is unremarkable.  I reviewed the patient's cervical MRI.  It demonstrates the patient has severe multifactoral spinal stenosis at C4-5, C5-6 and C6-7.  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  The patient's thoracic MRI demonstrates multilevel moderate stenosis in the lower thoracic spine.  I reviewed the patient's lumbar MRI performed [facility] yesterday.  He has a chronic grade 2 spondylolisthesis with severe bilateral foraminal stenosis.  Assessment/Plan: Cervical spondylosis, stenosis, myelopathy: I have discussed the situation with the patient and his family.  I have told him that he has severe narrowing in his neck which I think is his biggest problem causing a cervical myelopathy.  His thoracic and lumbar spine are graded either but I think the biggest problem is his neck.  I have discussed the various treatment options including doing nothing versus surgery.  I have described a C4-5, C5-6 and C6-7 anterior cervicectomy fusion and plating to them.  I have told him that there is no urgency to doing his surgery since these are chronic issues and particularly in light that he is on aspirin, fish oil, tumeric, and possibly Plavix.  I recommended that the patient follow-up with me in the office on Tuesday so we can go over his images, I can show him surgical models, and give him a surgical  pamphlet and further discuss surgery, the risk, benefits alternatives, expected postoperative course, and likelihood of achieving our goals with surgery.  They are agreeable to this plan.  He wants to be discharged.  I do not think he needs to stay in the hospital from my point of view.  I have spoken with Dr. Onalee Huaavid regarding this.  I will plan to see the patient on Tuesday in the office.  I have instructed him to stop his aspirin, Plavix, fish oil, and tumeric in anticipation of surgery.  I have answered all their questions.  Cristi LoronJeffrey D Kaytlin Burklow 04/29/2021, 6:03 PM

## 2021-04-29 NOTE — ED Notes (Signed)
ED TO INPATIENT HANDOFF REPORT  ED Nurse Name and Phone #: Trevontae Lindahl RN [phone]  S Name/Age/Gender Terry [name] 88 y.o. male Room/Bed: 037C/037C  Code Status   Code Status: Full Code  Home/SNF/Other Home Patient oriented to: self, place, time, and situation Is this baseline? Yes   Triage Complete: Triage complete  Chief Complaint Cervical stenosis of spinal canal [M48.02]  Triage Note Patient went to PCP today for evaluation cold symptoms. PCP sent patient to ED for evaluation of bilateral lower extremity weakness that started two weeks ago. Patient alert, oriented, and in no apparent distress at this time.  BP 170/72 HR 68 98% on room air 98.84F   Allergies No Known Allergies  Level of Care/Admitting Diagnosis ED Disposition     ED Disposition  Admit   Condition  --   Comment  H[facility] [[id number]]  Level of Care: Med-Surg [16]  May admit patient to [facility] or [facility] if equivalent level of care is available:: Yes  Covid Evaluation: Confirmed COVID Negative  Diagnosis: Cervical stenosis of spinal canal [854627]  Admitting Physician: Carlton Adam [[id number]]  Attending Physician: Carlton Adam [[id number]]  Estimated length of stay: past midnight tomorrow  Certification:: I certify this patient will need inpatient services for at least 2 midnights          B Medical/Surgery History Past Medical History:  Diagnosis Date   Chronic lower back pain    Complication of anesthesia    "blood pressure dropped low in dentist office during dental work"   DOE (dyspnea on exertion)    Dysrhythmia    Hx. intermittent Atrial Fibrilllation   GERD (gastroesophageal reflux disease)    Hepatitis B ~ 2013   "suspected; [facility]"   High cholesterol    Hypertension    New onset atrial fibrillation (HCC) 07/05/2013   Shortness of breath    Stroke (HCC) ~ 2012   residual right side decreased sensation    TIA (transient ischemic attack) 2012   of unknown origin/notes 07/05/2013; "I've had 3 or 4; call it stroke sometimes; TIA sometimes"   Vertigo 07/05/2013   Past Surgical History:  Procedure Laterality Date   CAPSULOTOMY  04/03/2012   Procedure: MINOR CAPSULOTOMY;  Surgeon: Vita Erm., MD;  Location: [facility] OR;  Service: Ophthalmology;  Laterality: Right;   CATARACT EXTRACTION W/PHACO  02/01/2012   Procedure: CATARACT EXTRACTION PHACO AND INTRAOCULAR LENS PLACEMENT (IOC);  Surgeon: Shade Flood, MD;  Location[facility] OR;  Service: Ophthalmology;  Laterality: Right;   COLONOSCOPY WITH PROPOFOL N/A 11/28/2013   Procedure: COLONOSCOPY WITH PROPOFOL;  Surgeon: Charolett Bumpers, MD;  Location: [facility] ENDOSCOPY;  Service: Endoscopy;  Laterality: N/A;   ESOPHAGOGASTRODUODENOSCOPY (EGD) WITH PROPOFOL N/A 11/28/2013   Procedure: ESOPHAGOGASTRODUODENOSCOPY (EGD) WITH PROPOFOL;  Surgeon: Charolett Bumpers, MD;  Location: [facility] ENDOSCOPY;  Service: Endoscopy;  Laterality: N/A;   EYE SURGERY     YAG LASER APPLICATION  04/03/2012   Procedure: YAG LASER APPLICATION;  Surgeon: Vita Erm., MD;  Location: [facility] OR;  Service: Ophthalmology;  Laterality: N/A;     A IV Location/Drains/Wounds Patient Lines/Drains/Airways Status     Active Line/Drains/Airways     Name Placement date Placement time Site Days   Peripheral IV Left Hand --  --  Hand  --   Incision 02/01/12 Eye Right 02/01/12  1017  -- 3375  Intake/Output Last 24 hours No intake or output data in the 24 hours ending 04/29/21 0110  Labs/Imaging Results for orders placed or performed during the hospital encounter of 04/28/21 (from the past 48 hour(s))  Comprehensive metabolic panel     Status: Abnormal   Collection Time: 04/28/21  3:26 PM  Result Value Ref Range   Sodium 134 (L) 135 - 145 mmol/L   Potassium 4.6 3.5 - 5.1 mmol/L   Chloride 100 98 - 111 mmol/L   CO2 28 22 - 32 mmol/L   Glucose, Bld 101 (H) 70 - 99 mg/dL     Comment: Glucose reference range applies only to samples taken after fasting for at least 8 hours.   BUN 7 (L) 8 - 23 mg/dL   Creatinine, Ser 3.35 0.61 - 1.24 mg/dL   Calcium 8.8 (L) 8.9 - 10.3 mg/dL   Total Protein 6.7 6.5 - 8.1 g/dL   Albumin 3.6 3.5 - 5.0 g/dL   AST 21 15 - 41 U/L   ALT 19 0 - 44 U/L   Alkaline Phosphatase 44 38 - 126 U/L   Total Bilirubin 0.3 0.3 - 1.2 mg/dL   GFR, Estimated >45 >62 mL/min    Comment: (NOTE) Calculated using the CKD-EPI Creatinine Equation (2021)    Anion gap 6 5 - 15    Comment: Performed at [facility], [address] [place], [place] [address]  CBC with Differential     Status: Abnormal   Collection Time: 04/28/21  3:26 PM  Result Value Ref Range   WBC 5.9 4.0 - 10.5 K/uL   RBC 4.85 4.22 - 5.81 MIL/uL   Hemoglobin 13.3 13.0 - 17.0 g/dL   HCT 37.3 42.8 - 76.8 %   MCV 88.0 80.0 - 100.0 fL   MCH 27.4 26.0 - 34.0 pg   MCHC 31.1 30.0 - 36.0 g/dL   RDW 11.5 72.6 - 20.3 %   Platelets 122 (L) 150 - 400 K/uL    Comment: REPEATED TO VERIFY   nRBC 0.0 0.0 - 0.2 %   Neutrophils Relative % 57 %   Neutro Abs 3.3 1.7 - 7.7 K/uL   Lymphocytes Relative 29 %   Lymphs Abs 1.7 0.7 - 4.0 K/uL   Monocytes Relative 9 %   Monocytes Absolute 0.5 0.1 - 1.0 K/uL   Eosinophils Relative 5 %   Eosinophils Absolute 0.3 0.0 - 0.5 K/uL   Basophils Relative 0 %   Basophils Absolute 0.0 0.0 - 0.1 K/uL   Immature Granulocytes 0 %   Abs Immature Granulocytes 0.01 0.00 - 0.07 K/uL    Comment: Performed at [facility], [address], [place], [place] [address]  Resp Panel by RT-PCR (Flu A&B, Covid) Nasopharyngeal Swab     Status: None   Collection Time: 04/28/21  6:14 PM   Specimen: Nasopharyngeal Swab; Nasopharyngeal(NP) swabs in vial transport medium  Result Value Ref Range   SARS Coronavirus 2 by RT PCR NEGATIVE NEGATIVE    Comment: (NOTE) SARS-CoV-2 target nucleic acids are NOT DETECTED.  The SARS-CoV-2 RNA is generally detectable in upper  respiratory specimens during the acute phase of infection. The lowest concentration of SARS-CoV-2 viral copies this assay can detect is 138 copies/mL. A negative result does not preclude SARS-Cov-2 infection and should not be used as the sole basis for treatment or other patient management decisions. A negative result may occur with  improper specimen collection/handling, submission of specimen other than nasopharyngeal swab, presence  of viral mutation(s) within the areas targeted by this assay, and inadequate number of viral copies(<138 copies/mL). A negative result must be combined with clinical observations, patient history, and epidemiological information. The expected result is Negative.  Fact Sheet for Patients:  BloggerCourse.comhttps://www.fda.gov/media/152166/download  Fact Sheet for Healthcare Providers:  SeriousBroker.ithttps://www.fda.gov/media/152162/download  This test is no t yet approved or cleared by the [place] FDA and  has been authorized for detection and/or diagnosis of SARS-CoV-2 by FDA under an Emergency Use Authorization (EUA). This EUA will remain  in effect (meaning this test can be used) for the duration of the COVID-19 declaration under Section 564(b)(1) of the Act, 21 U.S.C.section 360bbb-3(b)(1), unless the authorization is terminated  or revoked sooner.       Influenza A by PCR NEGATIVE NEGATIVE   Influenza B by PCR NEGATIVE NEGATIVE    Comment: (NOTE) The Xpert Xpress SARS-CoV-2/FLU/RSV plus assay is intended as an aid in the diagnosis of influenza from Nasopharyngeal swab specimens and should not be used as a sole basis for treatment. Nasal washings and aspirates are unacceptable for Xpert Xpress SARS-CoV-2/FLU/RSV testing.  Fact Sheet for Patients: BloggerCourse.comhttps://www.fda.gov/media/152166/download  Fact Sheet for Healthcare Providers: SeriousBroker.ithttps://www.fda.gov/media/152162/download  This test is not yet approved or cleared by the [place] FDA and has been authorized for  detection and/or diagnosis of SARS-CoV-2 by FDA under an Emergency Use Authorization (EUA). This EUA will remain in effect (meaning this test can be used) for the duration of the COVID-19 declaration under Section 564(b)(1) of the Act, 21 U.S.C. section 360bbb-3(b)(1), unless the authorization is terminated or revoked.  Performed at [facility], [address] [place], [place] [address]   TSH     Status: None   Collection Time: 04/28/21  7:58 PM  Result Value Ref Range   TSH 1.276 0.350 - 4.500 uIU/mL    Comment: Performed by a 3rd Generation assay with a functional sensitivity of <=0.01 uIU/mL. Performed at [facility], [address] [place], [place] [address]   T4, free     Status: None   Collection Time: 04/28/21  7:58 PM  Result Value Ref Range   Free T4 1.02 0.61 - 1.12 ng/dL    Comment: (NOTE) Biotin ingestion may interfere with free T4 tests. If the results are inconsistent with the TSH level, previous test results, or the clinical presentation, then consider biotin interference. If needed, order repeat testing after stopping biotin. Performed at [facility], [address] [place], [place] [address]    DG Chest 2 View  Result Date: 04/28/2021 CLINICAL DATA:  Cough EXAM: CHEST - 2 VIEW COMPARISON:  07/05/2013 FINDINGS: Cardiac size is within normal limits. There are no signs of pulmonary edema or focal pulmonary consolidation. Small linear density in the lateral right lower lung fields may suggest minimal scarring. There is no pleural effusion or pneumothorax. IMPRESSION: No active cardiopulmonary disease. Electronically Signed   By: Ernie AvenaPalani  Rathinasamy M.D.   On: 04/28/2021 15:56   CT HEAD WO CONTRAST (5MM)  Result Date: 04/28/2021 CLINICAL DATA:  A male at age 88 presents with weakness of bilateral lower extremities that began 2 weeks ago. EXAM: CT HEAD WITHOUT CONTRAST TECHNIQUE: Contiguous axial images were obtained from the base of the skull  through the vertex without intravenous contrast. RADIATION DOSE REDUCTION: This exam was performed according to the departmental dose-optimization program which includes automated exposure control, adjustment of the mA and/or kV according to patient size and/or use of iterative  reconstruction technique. COMPARISON:  May of 2015. FINDINGS: Brain: No evidence of acute infarction, hemorrhage, hydrocephalus, extra-axial collection or mass lesion/mass effect. Signs of mild atrophy and chronic microvascular ischemic change similar to prior imaging. Vascular: No hyperdense vessel or unexpected calcification. Skull: Normal. Negative for fracture or focal lesion. Sinuses/Orbits: Visualized paranasal sinuses and orbits are unremarkable. Other: None. IMPRESSION: 1. No acute intracranial abnormality. 2. Signs of mild atrophy and chronic microvascular ischemic change similar to prior imaging. Electronically Signed   By: Donzetta Kohut M.D.   On: 04/28/2021 15:56   MR Cervical Spine Wo Contrast  Result Date: 04/28/2021 CLINICAL DATA:  Myelopathy, bilateral foot drop, hand weakness EXAM: MRI CERVICAL SPINE WITHOUT CONTRAST TECHNIQUE: Multiplanar, multisequence MR imaging of the cervical spine was performed. No intravenous contrast was administered. COMPARISON:  None. FINDINGS: Evaluation is limited by poor study quality, in part secondary to motion. Alignment: Straightening of the normal cervical lordosis. Trace anterolisthesis C4 on C5. Trace retrolisthesis C5 on C6. Trace anterolisthesis C7 on T1 Vertebrae: Diffusely decreased marrow signal. No definite acute fracture. Congenitally short pedicles, which narrow the AP diameter of the spinal canal. 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 Posterior Fossa, vertebral arteries, paraspinal tissues: Degenerative pannus formation at C1. Disc  levels: C1-C2: Pannus formation causes moderate spinal canal stenosis. Evaluation of the neural foramina is limited but there appears to be at least moderate stenosis of the right neural foramen. C2-C3: Moderate disc bulge. Severe left greater than right facet and uncovertebral hypertrophy. Moderate spinal canal stenosis. Severe left-greater-than-right neural foraminal narrowing. C3-C4: Moderate disc bulge. Facet and uncovertebral hypertrophy. Moderate spinal canal stenosis. Severe left and moderate right neural foraminal narrowing. C4-C5: Trace anterolisthesis. Large broad-based disc bulge with superimposed central protrusion or disc osteophyte. Severe spinal canal stenosis with compressive myelopathy. Uncovertebral and facet arthropathy. Moderate bilateral neural foraminal narrowing. C5-C6: Trace retrolisthesis with large disc osteophyte complex. Facet and uncovertebral hypertrophy. Severe spinal canal stenosis with compressive myelopathy. Severe right and moderate left neural foraminal narrowing. C6-C7: Large disc bulge. Severe spinal canal stenosis with evidence of compressive myelopathy. Uncovertebral and facet arthropathy. Mild bilateral neural foraminal narrowing. C7-T1: Trace anterolisthesis with disc unroofing. Mild spinal canal stenosis. Facet arthropathy. No neural foraminal narrowing. IMPRESSION: 1. Evaluation is limited by poor study quality and motion artifact. 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 Additional less significant spinal canal stenosis (mild or moderate) at the other cervical levels. 2. Multilevel disc degenerative changes, facet arthropathy, and uncovertebral hypertrophy, which causes up to severe neural foraminal narrowing bilaterally at C2-C3, on the left at C3-C4, and on the right at C5-C6. 3. Diffusely decreased marrow signal,  which is nonspecific but can be seen in the setting of bone marrow hyperplasia, iron deposition, renal osteodystrophy, or myeloproliferative disease. These results were called by telephone at the time of interpretation on 04/28/2021 at 7:25 pm to provider [facility] , who verbally acknowledged these results. Electronically Signed   By: Wiliam Ke M.D.   On: 04/28/2021 19:25   MR THORACIC SPINE WO CONTRAST  Result Date: 04/28/2021 CLINICAL DATA:  Acute lumbar myelopathy.  Bilateral foot drop. EXAM: MRI THORACIC AND LUMBAR SPINE WITHOUT CONTRAST TECHNIQUE: Multiplanar and multiecho pulse sequences of the thoracic and  lumbar spine were obtained without intravenous contrast. COMPARISON:  10/29/2010 FINDINGS: MRI THORACIC SPINE FINDINGS Alignment:  Normal Vertebrae: Patchy low T1 marrow signal intensity could be due to anemia, smoking or osteoporosis. No focal bone lesions are identified. Cord: Grossly normal cord signal intensity. No cord lesions or syrinx. Paraspinal and other soft tissues: No significant paraspinal findings. Disc levels: Moderate degenerative changes in the thoracic spine with small disc protrusions and spurring but no significant canal stenosis. MRI LUMBAR SPINE FINDINGS Segmentation: There are five lumbar type vertebral bodies. The last full intervertebral disc space is labeled L5-S1. Alignment: Bilateral pars defects at L4 with a grade 1-2 spondylolisthesis which is unchanged. Vertebrae: Low T1 marrow signal could be due to anemia, smoking or osteoporosis. No focal bone lesions are identified. Marked endplate reactive changes at L4-5 with near Conus medullaris and cauda equina: Conus extends to the T12-L1 level. Conus and cauda equina appear normal. Paraspinal and other soft tissues: No significant paraspinal or retroperitoneal findings. Disc levels: T12-L1: No significant findings. L1-2: Bulging annulus and moderate facet disease with mild bilateral lateral recess stenosis but no  significant spinal stenosis or foraminal stenosis. L2-3: Bulging degenerated annulus and shallow central disc protrusion in conjunction with facet disease contributing to mild spinal and moderate bilateral lateral recess stenosis. L3-4: Bulging annulus and advanced facet disease with mild spinal and bilateral lateral recess stenosis. No significant foraminal stenosis. L4-5: Bilateral pars defects with grade 1-2 anterolisthesis of L4. There is a bulging uncovered disc and significant inflate reactive changes. No significant spinal stenosis. There is significant bilateral foraminal stenosis. L5-S1: No significant findings. IMPRESSION: 1. Stable bilateral pars defects at L4 with a grade 1-2 spondylolisthesis and associated advanced degenerative disc disease and facet disease at L4-5. 2. Significant bilateral foraminal stenosis at L4-5. 3. Mild spinal and bilateral lateral recess stenosis at L2-3 and L3-4. 4. Mild bilateral lateral recess stenosis at L1-2. 5. No significant thoracic disc protrusions or significant spinal stenosis. 6. Grossly normal MR appearance of the thoracic spinal cord. Electronically Signed   By: Rudie Meyer M.D.   On: 04/28/2021 19:45   MR LUMBAR SPINE WO CONTRAST  Result Date: 04/28/2021 CLINICAL DATA:  Acute lumbar myelopathy.  Bilateral foot drop. EXAM: MRI THORACIC AND LUMBAR SPINE WITHOUT CONTRAST TECHNIQUE: Multiplanar and multiecho pulse sequences of the thoracic and lumbar spine were obtained without intravenous contrast. COMPARISON:  10/29/2010 FINDINGS: MRI THORACIC SPINE FINDINGS Alignment:  Normal Vertebrae: Patchy low T1 marrow signal intensity could be due to anemia, smoking or osteoporosis. No focal bone lesions are identified. Cord: Grossly normal cord signal intensity. No cord lesions or syrinx. Paraspinal and other soft tissues: No significant paraspinal findings. Disc levels: Moderate degenerative changes in the thoracic spine with small disc protrusions and spurring but  no significant canal stenosis. MRI LUMBAR SPINE FINDINGS Segmentation: There are five lumbar type vertebral bodies. The last full intervertebral disc space is labeled L5-S1. Alignment: Bilateral pars defects at L4 with a grade 1-2 spondylolisthesis which is unchanged. Vertebrae: Low T1 marrow signal could be due to anemia, smoking or osteoporosis. No focal bone lesions are identified. Marked endplate reactive changes at L4-5 with near Conus medullaris and cauda equina: Conus extends to the T12-L1 level. Conus and cauda equina appear normal. Paraspinal and other soft tissues: No significant paraspinal or retroperitoneal findings. Disc levels: T12-L1: No significant findings. L1-2: Bulging annulus and moderate facet disease with mild bilateral lateral recess stenosis but no significant spinal stenosis or foraminal stenosis. L2-3: Bulging degenerated annulus  and shallow central disc protrusion in conjunction with facet disease contributing to mild spinal and moderate bilateral lateral recess stenosis. L3-4: Bulging annulus and advanced facet disease with mild spinal and bilateral lateral recess stenosis. No significant foraminal stenosis. L4-5: Bilateral pars defects with grade 1-2 anterolisthesis of L4. There is a bulging uncovered disc and significant inflate reactive changes. No significant spinal stenosis. There is significant bilateral foraminal stenosis. L5-S1: No significant findings. IMPRESSION: 1. Stable bilateral pars defects at L4 with a grade 1-2 spondylolisthesis and associated advanced degenerative disc disease and facet disease at L4-5. 2. Significant bilateral foraminal stenosis at L4-5. 3. Mild spinal and bilateral lateral recess stenosis at L2-3 and L3-4. 4. Mild bilateral lateral recess stenosis at L1-2. 5. No significant thoracic disc protrusions or significant spinal stenosis. 6. Grossly normal MR appearance of the thoracic spinal cord. Electronically Signed   By: Rudie MeyerP.  Gallerani M.D.   On:  04/28/2021 19:45    Pending Labs Unresulted Labs (From admission, onward)     Start     Ordered   04/29/21 0500  Basic metabolic panel  Tomorrow morning,   R        04/28/21 2212   04/29/21 0500  CBC  Tomorrow morning,   R        04/28/21 2212            Vitals/Pain Today's Vitals   04/28/21 1517 04/28/21 2000 04/28/21 2001 04/29/21 0000  BP:  129/69 132/65 (!) 145/81  Pulse:   76 65  Resp:   16 16  Temp:      TempSrc:      SpO2:   98% 93%  PainSc: 0-No pain       Isolation Precautions No active isolations  Medications Medications  aspirin EC tablet 81 mg (has no administration in time range)  atorvastatin (LIPITOR) tablet 10 mg (has no administration in time range)  ramipril (ALTACE) capsule 10 mg (has no administration in time range)  finasteride (PROSCAR) tablet 5 mg (has no administration in time range)  enoxaparin (LOVENOX) injection 40 mg (40 mg Subcutaneous Given 04/28/21 2352)  lactated ringers infusion ( Intravenous New Bag/Given 04/29/21 0006)  acetaminophen (TYLENOL) tablet 650 mg (has no administration in time range)    Or  acetaminophen (TYLENOL) suppository 650 mg (has no administration in time range)  senna-docusate (Senokot-S) tablet 1 tablet (has no administration in time range)  ondansetron (ZOFRAN) tablet 4 mg (has no administration in time range)    Or  ondansetron (ZOFRAN) injection 4 mg (has no administration in time range)    Mobility walks with device High fall risk    R Recommendations: See Admitting Provider Note  Report given to: Geannie RisenJami Steelman RN  Additional Notes:

## 2021-04-29 NOTE — Progress Notes (Signed)
Patient arrived to the floor alert and orientated.  Pt was able to stand and pivot from stretcher to bed.  Son in law was present and will be his support person for the night.  Pt was orientated to the room and unit policies.  Pt reports no pain and has no further questions at this time.  Bed alarm was on, call bell within reach, and bed in lowest position.

## 2021-04-29 NOTE — Evaluation (Signed)
Physical Therapy Evaluation Patient Details Name: Terry [name] MRN: [id number] DOB: 1933-11-09 Today's Date: 04/29/2021  History of Present Illness  88 y.o. male presents to [facility] hospital on 04/28/2021 with BLE weakness and falls. MRI demonstrates severe spinal stenosis at C5-7. PMH includes HTN, HLD, DOE, GERD, CVA.  Clinical Impression  Pt presents to PT with deficits in gait, balance, endurance, power, strength. Pt with bilateral foot drop and consistent bilateral knee hyperextension during gait due to LE weakness and poor muscular endurance. Pt remains at a high risk for falls due to LE strength deficits. Pt will benefit from aggressive mobilization and LE strengthening in an effort to reduce falls risk. If discharging home pt will benefit from HHPT. Pt will also benefit from a manual wheelchair to improve safety in community mobility.       Recommendations for follow up therapy are one component of a multi-disciplinary discharge planning process, led by the attending physician.  Recommendations may be updated based on patient status, additional functional criteria and insurance authorization.  Follow Up Recommendations Home health PT    Assistance Recommended at Discharge Intermittent Supervision/Assistance  Patient can return home with the following  A little help with walking and/or transfers;A little help with bathing/dressing/bathroom;Help with stairs or ramp for entrance;Assist for transportation;Assistance with Glass blower/designer (measurements PT)  Recommendations for Other Services       Functional Status Assessment Patient has had a recent decline in their functional status and demonstrates the ability to make significant improvements in function in a reasonable and predictable amount of time.     Precautions / Restrictions Precautions Precautions: Fall;Back Precaution Booklet Issued: No Precaution Comments: PT verbally reviewed back  precautions Restrictions Weight Bearing Restrictions: No      Mobility  Bed Mobility Overal bed mobility: Needs Assistance Bed Mobility: Rolling, Sidelying to Sit Rolling: Supervision Sidelying to sit: Min guard            Transfers Overall transfer level: Needs assistance Equipment used: Rolling walker (2 wheels) Transfers: Sit to/from Stand Sit to Stand: Min guard                Ambulation/Gait Ambulation/Gait assistance: Min guard Gait Distance (Feet): 20 Feet (x 2 trials) Assistive device: Rolling walker (2 wheels) Gait Pattern/deviations: Step-through pattern Gait velocity: reduced Gait velocity interpretation: <1.8 ft/sec, indicate of risk for recurrent falls   General Gait Details: pt with slowed step-through gait, pt consistently hyperextending bilateral knees during stance phase. Pt also with bilateral foot drop  Stairs            Wheelchair Mobility    Modified Rankin (Stroke Patients Only)       Balance Overall balance assessment: Needs assistance Sitting-balance support: No upper extremity supported, Feet supported Sitting balance-Leahy Scale: Good     Standing balance support: Bilateral upper extremity supported, Reliant on assistive device for balance Standing balance-Leahy Scale: Poor                               Pertinent Vitals/Pain Pain Assessment Pain Assessment: No/denies pain    Home Living Family/patient expects to be discharged to:: Private residence Living Arrangements: Spouse/significant other;Children Available Help at Discharge: Family;Available 24 hours/day Type of Home: House Home Access: Stairs to enter Entrance Stairs-Rails: None Entrance Stairs-Number of Steps: 3 Alternate Level Stairs-Number of Steps: 5 Home Layout: Multi-level (split level) Home Equipment: Rolling Walker (2 wheels);Rollator (4  wheels);Cane - single point      Prior Function Prior Level of Function : Independent/Modified  Independent             Mobility Comments: pt reports ambulating with a RW for household distances up until 2 weeks ago       Hand Dominance        Extremity/Trunk Assessment   Upper Extremity Assessment Upper Extremity Assessment: RUE deficits/detail;LUE deficits/detail RUE Deficits / Details: 4/5 grip strength and 4-/5 elbow extension LUE Deficits / Details: 4/5 grip strength and 4-/5 elbow extension    Lower Extremity Assessment Lower Extremity Assessment: RLE deficits/detail;LLE deficits/detail RLE Deficits / Details: 4-/5 knee extension, 3/5 PF, 0/5 DF RLE Sensation: decreased light touch LLE Deficits / Details: 4/5 knee extension, 3/5 PF, 0/5 DF LLE Sensation: decreased light touch    Cervical / Trunk Assessment Cervical / Trunk Assessment: Normal  Communication   Communication: No difficulties  Cognition Arousal/Alertness: Awake/alert Behavior During Therapy: WFL for tasks assessed/performed Overall Cognitive Status: Within Functional Limits for tasks assessed                                          General Comments General comments (skin integrity, edema, etc.): VSS on RA    Exercises     Assessment/Plan    PT Assessment Patient needs continued PT services  PT Problem List Decreased strength;Decreased activity tolerance;Decreased balance;Decreased mobility;Impaired sensation;Decreased knowledge of precautions       PT Treatment Interventions DME instruction;Gait training;Stair training;Functional mobility training;Therapeutic activities;Therapeutic exercise;Balance training;Neuromuscular re-education;Patient/family education    PT Goals (Current goals can be found in the Care Plan section)  Acute Rehab PT Goals Patient Stated Goal: to improve LE strength and return to independent mobility PT Goal Formulation: With patient/family Time For Goal Achievement: 05/13/21 Potential to Achieve Goals: Fair    Frequency Min 3X/week      Co-evaluation               AM-PAC PT "6 Clicks" Mobility  Outcome Measure Help needed turning from your back to your side while in a flat bed without using bedrails?: A Little Help needed moving from lying on your back to sitting on the side of a flat bed without using bedrails?: A Little Help needed moving to and from a bed to a chair (including a wheelchair)?: A Little Help needed standing up from a chair using your arms (e.g., wheelchair or bedside chair)?: A Little Help needed to walk in hospital room?: A Little Help needed climbing 3-5 steps with a railing? : Total 6 Click Score: 16    End of Session   Activity Tolerance: Patient tolerated treatment well Patient left: in chair;with call bell/phone within reach;with chair alarm set;with family/visitor present Nurse Communication: Mobility status PT Visit Diagnosis: Other abnormalities of gait and mobility (R26.89);Muscle weakness (generalized) (M62.81);Other symptoms and signs involving the nervous system (R29.898)    Time: 1102-1117 PT Time Calculation (min) (ACUTE ONLY): 49 min   Charges:   PT Evaluation $PT Eval Low Complexity: 1 Low PT Treatments $Gait Training: 8-22 mins        Arlyss Gandy, PT, DPT Acute Rehabilitation Pager: [phone] Office [phone]   Arlyss Gandy 04/29/2021, 4:30 PM

## 2021-04-29 NOTE — Progress Notes (Signed)
RN ordered pt's breakfast.

## 2021-04-30 ENCOUNTER — Other Ambulatory Visit: Payer: Self-pay | Admitting: Neurosurgery

## 2021-04-30 ENCOUNTER — Encounter (HOSPITAL_COMMUNITY): Payer: Self-pay | Admitting: Neurosurgery

## 2021-04-30 NOTE — TOC Transition Note (Addendum)
Transition of Care [facility]) - CM/SW Discharge Note   Patient Details  Name: Terry Gray MRN: [id number] Date of Birth: June 22, 1933  Transition of Care [facility]) CM/SW Contact:  Beckie Busing, RN Phone Number:[phone]  04/30/2021, 9:56 AM   Clinical Narrative:    Patient discharging home/ Pt recommendations for wheelchair and BSC. Orders have been entered and DME referral has been sent to [facility]. DME to be delivered to the bedside.   1249 CM at bedside to offer patient choice for [facility] . Patient states that he is scheduled for surgery on Monday and would like to wait until after surgery to set up home health services. MD updated.         Patient Goals and CMS Choice        Discharge Placement                       Discharge Plan and Services                                     Social Determinants of Health (SDOH) Interventions     Readmission Risk Interventions No flowsheet data found.

## 2021-04-30 NOTE — Progress Notes (Signed)
Providing Compassionate, Quality Care - Together   Subjective: Patient reports no new issues. He is preparing to discharge home.  Objective: Vital signs in last 24 hours: Temp:  [97.8 F (36.6 C)-98.2 F (36.8 C)] 98 F (36.7 C) (02/24 0745) Pulse Rate:  [50-64] 63 (02/24 0745) Resp:  [17-19] 18 (02/24 0745) BP: (126-152)/(59-77) 148/68 (02/24 0745) SpO2:  [92 %-98 %] 92 % (02/24 0745)  Intake/Output from previous day: 02/23 0701 - 02/24 0700 In: 360 [P.O.:360] Out: 1200 [Urine:1200] Intake/Output this shift: Total I/O In: 240 [P.O.:240] Out: -   Alert and oriented MAE Generalized weakness   Lab Results: Recent Labs    04/28/21 1526 04/29/21 0602  WBC 5.9 5.7  HGB 13.3 12.9*  HCT 42.7 39.0  PLT 122* 115*   BMET Recent Labs    04/28/21 1526 04/29/21 0602  NA 134* 139  K 4.6 3.9  CL 100 106  CO2 28 26  GLUCOSE 101* 98  BUN 7* 8  CREATININE 0.86 0.83  CALCIUM 8.8* 8.6*    Studies/Results: DG Chest 2 View  Result Date: 04/28/2021 CLINICAL DATA:  Cough EXAM: CHEST - 2 VIEW COMPARISON:  07/05/2013 FINDINGS: Cardiac size is within normal limits. There are no signs of pulmonary edema or focal pulmonary consolidation. Small linear density in the lateral right lower lung fields may suggest minimal scarring. There is no pleural effusion or pneumothorax. IMPRESSION: No active cardiopulmonary disease. Electronically Signed   By: Ernie Avena M.D.   On: 04/28/2021 15:56   CT HEAD WO CONTRAST ( )  Result Date: 04/28/2021 CLINICAL DATA:  A male at age 88 presents with weakness of bilateral lower extremities that began 2 weeks ago. EXAM: CT HEAD WITHOUT CONTRAST TECHNIQUE: Contiguous axial images were obtained from the base of the skull through the vertex without intravenous contrast. RADIATION DOSE REDUCTION: This exam was performed according to the departmental dose-optimization program which includes automated exposure control, adjustment of the mA and/or  kV according to patient size and/or use of iterative reconstruction technique. COMPARISON:  May of 2015. FINDINGS: Brain: No evidence of acute infarction, hemorrhage, hydrocephalus, extra-axial collection or mass lesion/mass effect. Signs of mild atrophy and chronic microvascular ischemic change similar to prior imaging. Vascular: No hyperdense vessel or unexpected calcification. Skull: Normal. Negative for fracture or focal lesion. Sinuses/Orbits: Visualized paranasal sinuses and orbits are unremarkable. Other: None. IMPRESSION: 1. No acute intracranial abnormality. 2. Signs of mild atrophy and chronic microvascular ischemic change similar to prior imaging. Electronically Signed   By: Donzetta Kohut M.D.   On: 04/28/2021 15:56   MR Cervical Spine Wo Contrast  Result Date: 04/28/2021 CLINICAL DATA:  Myelopathy, bilateral foot drop, hand weakness EXAM: MRI CERVICAL SPINE WITHOUT CONTRAST TECHNIQUE: Multiplanar, multisequence MR imaging of the cervical spine was performed. No intravenous contrast was administered. COMPARISON:  None. FINDINGS: Evaluation is limited by poor study quality, in part secondary to motion. Alignment: Straightening of the normal cervical lordosis. Trace anterolisthesis C4 on C5. Trace retrolisthesis C5 on C6. Trace anterolisthesis C7 on T1 Vertebrae: Diffusely decreased marrow signal. No definite acute fracture. Congenitally short pedicles, which narrow the AP diameter of the spinal canal. 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 Posterior Fossa, vertebral arteries, paraspinal tissues: Degenerative pannus formation at C1. Disc levels: C1-C2: Pannus formation causes moderate spinal canal stenosis. Evaluation of the neural foramina is  limited but there appears to be at least moderate stenosis of the right neural foramen. C2-C3: Moderate disc bulge.  Severe left greater than right facet and uncovertebral hypertrophy. Moderate spinal canal stenosis. Severe left-greater-than-right neural foraminal narrowing. C3-C4: Moderate disc bulge. Facet and uncovertebral hypertrophy. Moderate spinal canal stenosis. Severe left and moderate right neural foraminal narrowing. C4-C5: Trace anterolisthesis. Large broad-based disc bulge with superimposed central protrusion or disc osteophyte. Severe spinal canal stenosis with compressive myelopathy. Uncovertebral and facet arthropathy. Moderate bilateral neural foraminal narrowing. C5-C6: Trace retrolisthesis with large disc osteophyte complex. Facet and uncovertebral hypertrophy. Severe spinal canal stenosis with compressive myelopathy. Severe right and moderate left neural foraminal narrowing. C6-C7: Large disc bulge. Severe spinal canal stenosis with evidence of compressive myelopathy. Uncovertebral and facet arthropathy. Mild bilateral neural foraminal narrowing. C7-T1: Trace anterolisthesis with disc unroofing. Mild spinal canal stenosis. Facet arthropathy. No neural foraminal narrowing. IMPRESSION: 1. Evaluation is limited by poor study quality and motion artifact. 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 Additional less significant spinal canal stenosis (mild or moderate) at the other cervical levels. 2. Multilevel disc degenerative changes, facet arthropathy, and uncovertebral hypertrophy, which causes up to severe neural foraminal narrowing bilaterally at C2-C3, on the left at C3-C4, and on the right at C5-C6. 3. Diffusely decreased marrow signal, which is nonspecific but can be seen in the setting of bone marrow hyperplasia, iron deposition, renal osteodystrophy, or myeloproliferative disease. These results were called by telephone at the time of interpretation on  04/28/2021 at 7:25 pm to provider [facility] , who verbally acknowledged these results. Electronically Signed   By: Wiliam Ke M.D.   On: 04/28/2021 19:25   MR THORACIC SPINE WO CONTRAST  Result Date: 04/28/2021 CLINICAL DATA:  Acute lumbar myelopathy.  Bilateral foot drop. EXAM: MRI THORACIC AND LUMBAR SPINE WITHOUT CONTRAST TECHNIQUE: Multiplanar and multiecho pulse sequences of the thoracic and lumbar spine were obtained without intravenous contrast. COMPARISON:  10/29/2010 FINDINGS: MRI THORACIC SPINE FINDINGS Alignment:  Normal Vertebrae: Patchy low T1 marrow signal intensity could be due to anemia, smoking or osteoporosis. No focal bone lesions are identified. Cord: Grossly normal cord signal intensity. No cord lesions or syrinx. Paraspinal and other soft tissues: No significant paraspinal findings. Disc levels: Moderate degenerative changes in the thoracic spine with small disc protrusions and spurring but no significant canal stenosis. MRI LUMBAR SPINE FINDINGS Segmentation: There are five lumbar type vertebral bodies. The last full intervertebral disc space is labeled L5-S1. Alignment: Bilateral pars defects at L4 with a grade 1-2 spondylolisthesis which is unchanged. Vertebrae: Low T1 marrow signal could be due to anemia, smoking or osteoporosis. No focal bone lesions are identified. Marked endplate reactive changes at L4-5 with near Conus medullaris and cauda equina: Conus extends to the T12-L1 level. Conus and cauda equina appear normal. Paraspinal and other soft tissues: No significant paraspinal or retroperitoneal findings. Disc levels: T12-L1: No significant findings. L1-2: Bulging annulus and moderate facet disease with mild bilateral lateral recess stenosis but no significant spinal stenosis or foraminal stenosis. L2-3: Bulging degenerated annulus and shallow central disc protrusion in conjunction with facet disease contributing to mild spinal and moderate bilateral lateral recess stenosis.  L3-4: Bulging annulus and advanced facet disease with mild spinal and bilateral lateral recess stenosis. No significant foraminal stenosis. L4-5: Bilateral pars defects with grade 1-2 anterolisthesis of L4. There is a  bulging uncovered disc and significant inflate reactive changes. No significant spinal stenosis. There is significant bilateral foraminal stenosis. L5-S1: No significant findings. IMPRESSION: 1. Stable bilateral pars defects at L4 with a grade 1-2 spondylolisthesis and associated advanced degenerative disc disease and facet disease at L4-5. 2. Significant bilateral foraminal stenosis at L4-5. 3. Mild spinal and bilateral lateral recess stenosis at L2-3 and L3-4. 4. Mild bilateral lateral recess stenosis at L1-2. 5. No significant thoracic disc protrusions or significant spinal stenosis. 6. Grossly normal MR appearance of the thoracic spinal cord. Electronically Signed   By: Rudie Meyer M.D.   On: 04/28/2021 19:45   MR LUMBAR SPINE WO CONTRAST  Result Date: 04/28/2021 CLINICAL DATA:  Acute lumbar myelopathy.  Bilateral foot drop. EXAM: MRI THORACIC AND LUMBAR SPINE WITHOUT CONTRAST TECHNIQUE: Multiplanar and multiecho pulse sequences of the thoracic and lumbar spine were obtained without intravenous contrast. COMPARISON:  10/29/2010 FINDINGS: MRI THORACIC SPINE FINDINGS Alignment:  Normal Vertebrae: Patchy low T1 marrow signal intensity could be due to anemia, smoking or osteoporosis. No focal bone lesions are identified. Cord: Grossly normal cord signal intensity. No cord lesions or syrinx. Paraspinal and other soft tissues: No significant paraspinal findings. Disc levels: Moderate degenerative changes in the thoracic spine with small disc protrusions and spurring but no significant canal stenosis. MRI LUMBAR SPINE FINDINGS Segmentation: There are five lumbar type vertebral bodies. The last full intervertebral disc space is labeled L5-S1. Alignment: Bilateral pars defects at L4 with a grade 1-2  spondylolisthesis which is unchanged. Vertebrae: Low T1 marrow signal could be due to anemia, smoking or osteoporosis. No focal bone lesions are identified. Marked endplate reactive changes at L4-5 with near Conus medullaris and cauda equina: Conus extends to the T12-L1 level. Conus and cauda equina appear normal. Paraspinal and other soft tissues: No significant paraspinal or retroperitoneal findings. Disc levels: T12-L1: No significant findings. L1-2: Bulging annulus and moderate facet disease with mild bilateral lateral recess stenosis but no significant spinal stenosis or foraminal stenosis. L2-3: Bulging degenerated annulus and shallow central disc protrusion in conjunction with facet disease contributing to mild spinal and moderate bilateral lateral recess stenosis. L3-4: Bulging annulus and advanced facet disease with mild spinal and bilateral lateral recess stenosis. No significant foraminal stenosis. L4-5: Bilateral pars defects with grade 1-2 anterolisthesis of L4. There is a bulging uncovered disc and significant inflate reactive changes. No significant spinal stenosis. There is significant bilateral foraminal stenosis. L5-S1: No significant findings. IMPRESSION: 1. Stable bilateral pars defects at L4 with a grade 1-2 spondylolisthesis and associated advanced degenerative disc disease and facet disease at L4-5. 2. Significant bilateral foraminal stenosis at L4-5. 3. Mild spinal and bilateral lateral recess stenosis at L2-3 and L3-4. 4. Mild bilateral lateral recess stenosis at L1-2. 5. No significant thoracic disc protrusions or significant spinal stenosis. 6. Grossly normal MR appearance of the thoracic spinal cord. Electronically Signed   By: Rudie Meyer M.D.   On: 04/28/2021 19:45    Assessment/Plan: Patient with significant cervical spinal stenosis. Plan is for discharge today and surgery on Monday morning. I reviewed the procedure of a C4-5, C5-6, C6-7 ACDF with the patient and his family. I  answered all of their questions. They were given a surgical pamphlet and paperwork with information regarding surgery time and medications to avoid prior to surgery.   LOS: 2 days     Val Eagle, DNP, AGNP-C Nurse Practitioner  [facility] [address], [place], [place] [address] P:  [phone]     F: [phone]  04/30/2021, 12:13 PM

## 2021-04-30 NOTE — Discharge Summary (Signed)
Physician Discharge Summary  Terry Gray [id number] DOB: 1933/08/06 DOA: 04/28/2021  PCP: Wenda Low, MD  Admit date: 04/28/2021 Discharge date: 04/30/2021  Time spent: 35 minutes  Recommendations for Outpatient Follow-up:  Follow-up with Dr. Arnoldo Morale neurosurgery on Tuesday Follow-up with primary care physician in 1 to 2 weeks    Discharge Diagnoses:  Principal Problem:   Cervical stenosis of spinal canal Active Problems:   Essential hypertension   Sinus bradycardia   Foot drop, bilateral   DDD (degenerative disc disease), lumbar   Discharge Condition: Stable   Filed Weights   04/29/21 0334  Weight: 67.6 kg    History of present illness:  Terry Gray is a 88 y.o. male with medical history significant of HTN, HLD who presents for evaluation of weakness in his legs for the last year.  He reports has been getting worse over the last few months.  He has been able to ambulate with his walker up until a few weeks ago.  He reports that both of his feet drop and he has to drag them when he tries to walk.  He has had multiple falls at home but he has not had any head trauma or loss of consciousness.  He denies any injury to his hips or shoulders with the falls.  He does have a history of lumbar degenerative disc disease and saw neurosurgery in the past but no surgical intervention was attempted.  He reports having some numbness in his fingers and hands bilaterally as well as numbness/tingling in his feet bilaterally is been present for over a year and is unchanged.  He went to see his PCP today with his falls and not able to ambulate and was sent to the hospital for further evaluation.  He denies any weight loss, headache, fever, chills, chest pain, palpitations, shortness of breath, cough, urinary symptoms.  He has not had any seizure activity, facial droop or visual change. Denies tobacco alcohol or illicit drug use ER physician discussed with neurosurgery who will evaluate  patient in the morning.  Patient had MRIs of his cervical, thoracic and lumbar spine which showed severe spinal stenosis in the cervical region of C5-C7 as well as degenerative disc disease in the cervical spine.  He also has large degenerative disc disease.  Hospitalist service asked to admit for further management    Hospital Course:  Cervical stenosis of spinal canal- (present on admission) Terry Gray is admitted to Med Surg floor Patient seen by neurosurgery Dr. Arnoldo Morale recommending holding Plavix and aspirin following up next week to discuss further interventions with cervical surgery.    DDD (degenerative disc disease), lumbar Chronic.  Evaluated by neurosurgery Dr. Arnoldo Morale as above   Foot drop, bilateral PT to evaluate Neurosurgery consulted   Sinus bradycardia Stable and asymptomatic.  HR ranges from 58-76. No chest pain   Essential hypertension- (present on admission) Continue Altace. Monitor BP  Patient be discharged in stable and baseline condition.  Patient has made an appointment with Dr. Arnoldo Morale on Tuesday to review his scans and to go over surgical plan at that time.  He is to hold his Plavix and aspirin in preparation for surgery in the next week or so.  He also would like to defer arranging home health physical therapy until after his surgery.  Discharge Exam: Vitals:   04/30/21 0306 04/30/21 0745  BP: (!) 152/77 (!) 148/68  Pulse: 64 63  Resp: 18 18  Temp: 98 F (36.7 C) 98 F (36.7 C)  SpO2: 94% 92%    General: Alert and orient x4 no apparent distress Cardiovascular: Regular rate and rhythm without murmurs rubs or gallops Respiratory: Clear to auscultation bilaterally no wheezes rhonchi or rales  Discharge Instructions   Discharge Instructions     Diet - low sodium heart healthy   Complete by: As directed    Discharge instructions   Complete by: As directed    Follow-up with Dr. Arnoldo Morale on Tuesday  Follow-up with primary care physician in 1 to 2  weeks   Increase activity slowly   Complete by: As directed       Allergies as of 04/30/2021   No Known Allergies      Medication List     STOP taking these medications    aspirin EC 81 MG tablet   Psyllium 48.57 % Powd   sildenafil 50 MG tablet Commonly known as: VIAGRA   Triple Omega-3-6-9 Caps       TAKE these medications    atorvastatin 10 MG tablet Commonly known as: LIPITOR Take 10 mg by mouth every evening.   docusate sodium 100 MG capsule Commonly known as: COLACE Take 100 mg by mouth daily.   finasteride 5 MG tablet Commonly known as: PROSCAR Take 5 mg by mouth daily.   ramipril 10 MG capsule Commonly known as: ALTACE Take 10 mg by mouth every morning.   Vitamin D (Ergocalciferol) 1.25 MG (50000 UNIT) Caps capsule Commonly known as: DRISDOL Take 50,000 Units by mouth 2 (two) times a week.               Durable Medical Equipment  (From admission, onward)           Start     Ordered   04/30/21 1040  For home use only DME standard manual wheelchair with seat cushion  Once       Comments: Patient suffers from weakness which impairs their ability to perform daily activities like bathing, dressing, grooming, and toileting in the home.  A cane or crutch will not resolve issue with performing activities of daily living. A wheelchair will allow patient to safely perform daily activities. Patient can safely propel the wheelchair in the home or has a caregiver who can provide assistance. Length of need 12 months . Accessories: elevating leg rests (ELRs), wheel locks, extensions and anti-tippers.   04/30/21 1041   04/30/21 0942  For home use only DME wheelchair cushion (seat and back)  Once        04/30/21 0941   04/30/21 0941  For home use only DME 3 n 1  Once        04/30/21 0941           No Known Allergies    The results of significant diagnostics from this hospitalization (including imaging, microbiology, ancillary and laboratory)  are listed below for reference.    Significant Diagnostic Studies: DG Chest 2 View  Result Date: 04/28/2021 CLINICAL DATA:  Cough EXAM: CHEST - 2 VIEW COMPARISON:  07/05/2013 FINDINGS: Cardiac size is within normal limits. There are no signs of pulmonary edema or focal pulmonary consolidation. Small linear density in the lateral right lower lung fields may suggest minimal scarring. There is no pleural effusion or pneumothorax. IMPRESSION: No active cardiopulmonary disease. Electronically Signed   By: Elmer Picker M.D.   On: 04/28/2021 15:56   CT HEAD WO CONTRAST (5MM)  Result Date: 04/28/2021 CLINICAL DATA:  A male at age 54 presents with weakness of bilateral  lower extremities that began 2 weeks ago. EXAM: CT HEAD WITHOUT CONTRAST TECHNIQUE: Contiguous axial images were obtained from the base of the skull through the vertex without intravenous contrast. RADIATION DOSE REDUCTION: This exam was performed according to the departmental dose-optimization program which includes automated exposure control, adjustment of the mA and/or kV according to patient size and/or use of iterative reconstruction technique. COMPARISON:  May of 2015. FINDINGS: Brain: No evidence of acute infarction, hemorrhage, hydrocephalus, extra-axial collection or mass lesion/mass effect. Signs of mild atrophy and chronic microvascular ischemic change similar to prior imaging. Vascular: No hyperdense vessel or unexpected calcification. Skull: Normal. Negative for fracture or focal lesion. Sinuses/Orbits: Visualized paranasal sinuses and orbits are unremarkable. Other: None. IMPRESSION: 1. No acute intracranial abnormality. 2. Signs of mild atrophy and chronic microvascular ischemic change similar to prior imaging. Electronically Signed   By: Zetta Bills M.D.   On: 04/28/2021 15:56   MR Cervical Spine Wo Contrast  Result Date: 04/28/2021 CLINICAL DATA:  Myelopathy, bilateral foot drop, hand weakness EXAM: MRI CERVICAL SPINE  WITHOUT CONTRAST TECHNIQUE: Multiplanar, multisequence MR imaging of the cervical spine was performed. No intravenous contrast was administered. COMPARISON:  None. FINDINGS: Evaluation is limited by poor study quality, in part secondary to motion. Alignment: Straightening of the normal cervical lordosis. Trace anterolisthesis C4 on C5. Trace retrolisthesis C5 on C6. Trace anterolisthesis C7 on T1 Vertebrae: Diffusely decreased marrow signal. No definite acute fracture. Congenitally short pedicles, which narrow the AP diameter of the spinal canal. 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 Posterior Fossa, vertebral arteries, paraspinal tissues: Degenerative pannus formation at C1. Disc levels: C1-C2: Pannus formation causes moderate spinal canal stenosis. Evaluation of the neural foramina is limited but there appears to be at least moderate stenosis of the right neural foramen. C2-C3: Moderate disc bulge. Severe left greater than right facet and uncovertebral hypertrophy. Moderate spinal canal stenosis. Severe left-greater-than-right neural foraminal narrowing. C3-C4: Moderate disc bulge. Facet and uncovertebral hypertrophy. Moderate spinal canal stenosis. Severe left and moderate right neural foraminal narrowing. C4-C5: Trace anterolisthesis. Large broad-based disc bulge with superimposed central protrusion or disc osteophyte. Severe spinal canal stenosis with compressive myelopathy. Uncovertebral and facet arthropathy. Moderate bilateral neural foraminal narrowing. C5-C6: Trace retrolisthesis with large disc osteophyte complex. Facet and uncovertebral hypertrophy. Severe spinal canal stenosis with compressive myelopathy. Severe right and moderate left neural foraminal narrowing. C6-C7: Large disc bulge. Severe spinal canal stenosis with evidence of compressive myelopathy.  Uncovertebral and facet arthropathy. Mild bilateral neural foraminal narrowing. C7-T1: Trace anterolisthesis with disc unroofing. Mild spinal canal stenosis. Facet arthropathy. No neural foraminal narrowing. IMPRESSION: 1. Evaluation is limited by poor study quality and motion artifact. 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 Additional less significant spinal canal stenosis (mild or moderate) at the other cervical levels. 2. Multilevel disc degenerative changes, facet arthropathy, and uncovertebral hypertrophy, which causes up to severe neural foraminal narrowing bilaterally at C2-C3, on the left at C3-C4, and on the right at C5-C6. 3. Diffusely decreased marrow signal, which is nonspecific but can be seen in the setting of bone marrow hyperplasia, iron deposition, renal osteodystrophy, or myeloproliferative disease. These results were called by telephone at the time of interpretation on 04/28/2021 at 7:25 pm to  provider Lajean Saver , who verbally acknowledged these results. Electronically Signed   By: Merilyn Baba M.D.   On: 04/28/2021 19:25   MR THORACIC SPINE WO CONTRAST  Result Date: 04/28/2021 CLINICAL DATA:  Acute lumbar myelopathy.  Bilateral foot drop. EXAM: MRI THORACIC AND LUMBAR SPINE WITHOUT CONTRAST TECHNIQUE: Multiplanar and multiecho pulse sequences of the thoracic and lumbar spine were obtained without intravenous contrast. COMPARISON:  10/29/2010 FINDINGS: MRI THORACIC SPINE FINDINGS Alignment:  Normal Vertebrae: Patchy low T1 marrow signal intensity could be due to anemia, smoking or osteoporosis. No focal bone lesions are identified. Cord: Grossly normal cord signal intensity. No cord lesions or syrinx. Paraspinal and other soft tissues: No significant paraspinal findings. Disc levels: Moderate degenerative changes in the thoracic spine  with small disc protrusions and spurring but no significant canal stenosis. MRI LUMBAR SPINE FINDINGS Segmentation: There are five lumbar type vertebral bodies. The last full intervertebral disc space is labeled L5-S1. Alignment: Bilateral pars defects at L4 with a grade 1-2 spondylolisthesis which is unchanged. Vertebrae: Low T1 marrow signal could be due to anemia, smoking or osteoporosis. No focal bone lesions are identified. Marked endplate reactive changes at L4-5 with near Conus medullaris and cauda equina: Conus extends to the T12-L1 level. Conus and cauda equina appear normal. Paraspinal and other soft tissues: No significant paraspinal or retroperitoneal findings. Disc levels: T12-L1: No significant findings. L1-2: Bulging annulus and moderate facet disease with mild bilateral lateral recess stenosis but no significant spinal stenosis or foraminal stenosis. L2-3: Bulging degenerated annulus and shallow central disc protrusion in conjunction with facet disease contributing to mild spinal and moderate bilateral lateral recess stenosis. L3-4: Bulging annulus and advanced facet disease with mild spinal and bilateral lateral recess stenosis. No significant foraminal stenosis. L4-5: Bilateral pars defects with grade 1-2 anterolisthesis of L4. There is a bulging uncovered disc and significant inflate reactive changes. No significant spinal stenosis. There is significant bilateral foraminal stenosis. L5-S1: No significant findings. IMPRESSION: 1. Stable bilateral pars defects at L4 with a grade 1-2 spondylolisthesis and associated advanced degenerative disc disease and facet disease at L4-5. 2. Significant bilateral foraminal stenosis at L4-5. 3. Mild spinal and bilateral lateral recess stenosis at L2-3 and L3-4. 4. Mild bilateral lateral recess stenosis at L1-2. 5. No significant thoracic disc protrusions or significant spinal stenosis. 6. Grossly normal MR appearance of the thoracic spinal cord. Electronically  Signed   By: Marijo Sanes M.D.   On: 04/28/2021 19:45   MR LUMBAR SPINE WO CONTRAST  Result Date: 04/28/2021 CLINICAL DATA:  Acute lumbar myelopathy.  Bilateral foot drop. EXAM: MRI THORACIC AND LUMBAR SPINE WITHOUT CONTRAST TECHNIQUE: Multiplanar and multiecho pulse sequences of the thoracic and lumbar spine were obtained without intravenous contrast. COMPARISON:  10/29/2010 FINDINGS: MRI THORACIC SPINE FINDINGS Alignment:  Normal Vertebrae: Patchy low T1 marrow signal intensity could be due to anemia, smoking or osteoporosis. No focal bone lesions are identified. Cord: Grossly normal cord signal intensity. No cord lesions or syrinx. Paraspinal and other soft tissues: No significant paraspinal findings. Disc levels: Moderate degenerative changes in the thoracic spine with small disc protrusions and spurring but no significant canal stenosis. MRI LUMBAR SPINE FINDINGS Segmentation: There are five lumbar type vertebral bodies. The last full intervertebral disc space is labeled L5-S1. Alignment: Bilateral pars defects at L4 with a grade 1-2 spondylolisthesis which is unchanged. Vertebrae: Low T1 marrow signal could be due to anemia, smoking or osteoporosis. No focal bone lesions are identified. Marked endplate reactive changes at  L4-5 with near Conus medullaris and cauda equina: Conus extends to the T12-L1 level. Conus and cauda equina appear normal. Paraspinal and other soft tissues: No significant paraspinal or retroperitoneal findings. Disc levels: T12-L1: No significant findings. L1-2: Bulging annulus and moderate facet disease with mild bilateral lateral recess stenosis but no significant spinal stenosis or foraminal stenosis. L2-3: Bulging degenerated annulus and shallow central disc protrusion in conjunction with facet disease contributing to mild spinal and moderate bilateral lateral recess stenosis. L3-4: Bulging annulus and advanced facet disease with mild spinal and bilateral lateral recess stenosis.  No significant foraminal stenosis. L4-5: Bilateral pars defects with grade 1-2 anterolisthesis of L4. There is a bulging uncovered disc and significant inflate reactive changes. No significant spinal stenosis. There is significant bilateral foraminal stenosis. L5-S1: No significant findings. IMPRESSION: 1. Stable bilateral pars defects at L4 with a grade 1-2 spondylolisthesis and associated advanced degenerative disc disease and facet disease at L4-5. 2. Significant bilateral foraminal stenosis at L4-5. 3. Mild spinal and bilateral lateral recess stenosis at L2-3 and L3-4. 4. Mild bilateral lateral recess stenosis at L1-2. 5. No significant thoracic disc protrusions or significant spinal stenosis. 6. Grossly normal MR appearance of the thoracic spinal cord. Electronically Signed   By: Marijo Sanes M.D.   On: 04/28/2021 19:45    Microbiology: Recent Results (from the past 240 hour(s))  Resp Panel by RT-PCR (Flu A&B, Covid) Nasopharyngeal Swab     Status: None   Collection Time: 04/28/21  6:14 PM   Specimen: Nasopharyngeal Swab; Nasopharyngeal(NP) swabs in vial transport medium  Result Value Ref Range Status   SARS Coronavirus 2 by RT PCR NEGATIVE NEGATIVE Final    Comment: (NOTE) SARS-CoV-2 target nucleic acids are NOT DETECTED.  The SARS-CoV-2 RNA is generally detectable in upper respiratory specimens during the acute phase of infection. The lowest concentration of SARS-CoV-2 viral copies this assay can detect is 138 copies/mL. A negative result does not preclude SARS-Cov-2 infection and should not be used as the sole basis for treatment or other patient management decisions. A negative result may occur with  improper specimen collection/handling, submission of specimen other than nasopharyngeal swab, presence of viral mutation(s) within the areas targeted by this assay, and inadequate number of viral copies(<138 copies/mL). A negative result must be combined with clinical observations,  patient history, and epidemiological information. The expected result is Negative.  Fact Sheet for Patients:  EntrepreneurPulse.com.au  Fact Sheet for Healthcare Providers:  IncredibleEmployment.be  This test is no t yet approved or cleared by the [place] FDA and  has been authorized for detection and/or diagnosis of SARS-CoV-2 by FDA under an Emergency Use Authorization (EUA). This EUA will remain  in effect (meaning this test can be used) for the duration of the COVID-19 declaration under Section 564(b)(1) of the Act, 21 U.S.C.section 360bbb-3(b)(1), unless the authorization is terminated  or revoked sooner.       Influenza A by PCR NEGATIVE NEGATIVE Final   Influenza B by PCR NEGATIVE NEGATIVE Final    Comment: (NOTE) The Xpert Xpress SARS-CoV-2/FLU/RSV plus assay is intended as an aid in the diagnosis of influenza from Nasopharyngeal swab specimens and should not be used as a sole basis for treatment. Nasal washings and aspirates are unacceptable for Xpert Xpress SARS-CoV-2/FLU/RSV testing.  Fact Sheet for Patients: EntrepreneurPulse.com.au  Fact Sheet for Healthcare Providers: IncredibleEmployment.be  This test is not yet approved or cleared by the [place] FDA and has been authorized for detection and/or diagnosis of SARS-CoV-2  by FDA under an Emergency Use Authorization (EUA). This EUA will remain in effect (meaning this test can be used) for the duration of the COVID-19 declaration under Section 564(b)(1) of the Act, 21 U.S.C. section 360bbb-3(b)(1), unless the authorization is terminated or revoked.  Performed at [facility], [place] [address] [place], [place] [address]   Surgical pcr screen     Status: None   Collection Time: 04/29/21  2:30 AM   Specimen: Nasal Mucosa; Nasal Swab  Result Value Ref Range Status   MRSA, PCR NEGATIVE NEGATIVE Final   Staphylococcus aureus  NEGATIVE NEGATIVE Final    Comment: (NOTE) The Xpert SA Assay (FDA approved for NASAL specimens in patients 59 years of age and older), is one component of a comprehensive surveillance program. It is not intended to diagnose infection nor to guide or monitor treatment. Performed at [facility], [place] [address], [place], [place] [address]      Labs: Basic Metabolic Panel: Recent Labs  Lab 04/28/21 1526 04/29/21 0602  NA 134* 139  K 4.6 3.9  CL 100 106  CO2 28 26  GLUCOSE 101* 98  BUN 7* 8  CREATININE 0.86 0.83  CALCIUM 8.8* 8.6*   Liver Function Tests: Recent Labs  Lab 04/28/21 1526  AST 21  ALT 19  ALKPHOS 44  BILITOT 0.3  PROT 6.7  ALBUMIN 3.6   No results for input(s): LIPASE, AMYLASE in the last 168 hours. No results for input(s): AMMONIA in the last 168 hours. CBC: Recent Labs  Lab 04/28/21 1526 04/29/21 0602  WBC 5.9 5.7  NEUTROABS 3.3  --   HGB 13.3 12.9*  HCT 42.7 39.0  MCV 88.0 85.2  PLT 122* 115*   Cardiac Enzymes: No results for input(s): CKTOTAL, CKMB, CKMBINDEX, TROPONINI in the last 168 hours. BNP: BNP (last 3 results) No results for input(s): BNP in the last 8760 hours.  ProBNP (last 3 results) No results for input(s): PROBNP in the last 8760 hours.  CBG: No results for input(s): GLUCAP in the last 168 hours.     Signed:  Phillips Grout MD.  [facility] 04/30/2021, 4:35 PM

## 2021-04-30 NOTE — Progress Notes (Addendum)
[name] denies chest pain or shortness of breath. Patient denies having any s/s of Covid in his household.  Patient denies any known exposure to Covid.  Mr Terry Gray was instructed to hold ASA. I instructed patient to not take Vitamin until after surgery.  PCP is Dr. Marilynn Latino.  [name] has not had a BM for 3 days , he is taking Colace and had 1 dose of MIralax. I encouraged patient to take additional dose  of Miralax today and to check with his pharmacy. I instructed [name] to shower with antibiotic soap, if it is available.  Dry off with a clean towel. Do not put lotion, powder, cologne or deodorant or makeup.No jewelry or piercings. Men may shave their face and neck. Woman should not shave. No nail polish, artificial or acrylic nails. Wear clean clothes, brush your teeth. Glasses, contact lens,dentures or partials may not be worn in the OR. If you need to wear them, please bring a case for glasses, do not wear contacts or bring a case, the hospital does not have contact cases, dentures or partials will have to be removed , make sure they are clean, we will provide a denture cup to put them in. You will need some one to drive you home and a responsible person over the age of 55 to stay with you for the first 24 hours after surgery.

## 2021-04-30 NOTE — TOC Progression Note (Signed)
Transition of Care [facility]) - Progression Note    Patient Details  Name: ALVY TRAMMEL MRN: [id number] Date of Birth: 04/17/1933  Transition of Care [facility]) CM/SW [place], RN Phone Number:[phone]  04/30/2021, 10:41 AM  Clinical Narrative:     Patient suffers from weakness which impairs their ability to perform daily activities like bathing, dressing, grooming, and toileting in the home.  A cane or crutch will not resolve issue with performing activities of daily living. A wheelchair will allow patient to safely perform daily activities. Patient can safely propel the wheelchair in the home or has a caregiver who can provide assistance. Length of need 12 months . Accessories: elevating leg rests (ELRs), wheel locks, extensions and anti-tippers.       Expected Discharge Plan and Services           Expected Discharge Date: 04/30/21               DME Arranged: 3-N-1, Wheelchair manual DME Agency: [employer] Date DME Agency Contacted: 04/30/21 Time DME Agency Contacted: 1005 Representative spoke with at DME Agency: [place] ([facility]) Interventions    Readmission Risk Interventions No flowsheet data found.

## 2021-04-30 NOTE — Progress Notes (Signed)
Discharge Nursing Note  Patient alert and oriented , verbalized understanding of instructions.Daughter christy also \\present  at the bedside. All belongings given to patient. Home equipments also given to family.

## 2021-04-30 NOTE — Progress Notes (Signed)
Physical Therapy Treatment Patient Details Name: Terry Gray MRN: [id number] DOB: 10-19-1933 Today's Date: 04/30/2021   History of Present Illness 88 y.o. male presents to [facility] hospital on 04/28/2021 with BLE weakness and falls. MRI demonstrates severe spinal stenosis at C5-7. PMH includes HTN, HLD, DOE, GERD, CVA.    PT Comments    Pt tolerates treatment well, ambulating for increased distances and progressing to stair training. Pt continues to demonstrate BLE weakness, resulting in knee hyperextension and bilateral foot drop. PT remains at an increased risk of falls, with PT encouraging assistance for stair negotiation and ambulation at the time of discharge. Pt may benefit from AFO use if non-operative management is decided on. Pt will benefit from a wheelchair to aide in independent mobility within the home. Pt will also benefit from recieiving a bedside commode.   Recommendations for follow up therapy are one component of a multi-disciplinary discharge planning process, led by the attending physician.  Recommendations may be updated based on patient status, additional functional criteria and insurance authorization.  Follow Up Recommendations  Home health PT     Assistance Recommended at Discharge Intermittent Supervision/Assistance  Patient can return home with the following A little help with walking and/or transfers;A little help with bathing/dressing/bathroom;Help with stairs or ramp for entrance;Assist for transportation;Assistance with Charity fundraiser (measurements PT);BSC/3in1    Recommendations for Other Services       Precautions / Restrictions Precautions Precautions: Fall;Back Precaution Booklet Issued: No Restrictions Weight Bearing Restrictions: No     Mobility  Bed Mobility Overal bed mobility: Modified Independent Bed Mobility: Supine to Sit     Supine to sit: Modified independent (Device/Increase time)      General bed mobility comments: HOB elevated    Transfers Overall transfer level: Needs assistance Equipment used: Rolling walker (2 wheels) Transfers: Sit to/from Stand Sit to Stand: Min guard                Ambulation/Gait Ambulation/Gait assistance: Land (Feet): 50 Feet Assistive device: Rolling walker (2 wheels) Gait Pattern/deviations: Step-through pattern, Knee hyperextension - right, Knee hyperextension - left, Decreased dorsiflexion - right, Decreased dorsiflexion - left Gait velocity: reduced Gait velocity interpretation: <1.8 ft/sec, indicate of risk for recurrent falls   General Gait Details: pt with slowed step-through gait, pt consistently hyperextending bilateral knees during stance phase. Pt also with bilateral foot drop   Stairs Stairs: Yes Stairs assistance: Min guard, Min assist Stair Management: One rail Right, Sideways, Step to pattern (pt also attempts 3 steps backward with RW) Number of Stairs: 6 (additional trial of 3 steps backward with RW and minA)     Wheelchair Mobility    Modified Rankin (Stroke Patients Only)       Balance Overall balance assessment: Needs assistance Sitting-balance support: No upper extremity supported, Feet supported Sitting balance-Leahy Scale: Good     Standing balance support: Bilateral upper extremity supported, Reliant on assistive device for balance Standing balance-Leahy Scale: Poor                              Cognition Arousal/Alertness: Awake/alert Behavior During Therapy: WFL for tasks assessed/performed Overall Cognitive Status: Within Functional Limits for tasks assessed  Exercises      General Comments General comments (skin integrity, edema, etc.): VSS on RA      Pertinent Vitals/Pain Pain Assessment Pain Assessment: No/denies pain    Home Living                          Prior Function             PT Goals (current goals can now be found in the care plan section) Acute Rehab PT Goals Patient Stated Goal: to improve LE strength and return to independent mobility Progress towards PT goals: Progressing toward goals    Frequency    Min 3X/week      PT Plan Current plan remains appropriate    Co-evaluation              AM-PAC PT "6 Clicks" Mobility   Outcome Measure  Help needed turning from your back to your side while in a flat bed without using bedrails?: None Help needed moving from lying on your back to sitting on the side of a flat bed without using bedrails?: None Help needed moving to and from a bed to a chair (including a wheelchair)?: A Little Help needed standing up from a chair using your arms (e.g., wheelchair or bedside chair)?: A Little Help needed to walk in hospital room?: A Little Help needed climbing 3-5 steps with a railing? : A Little 6 Click Score: 20    End of Session   Activity Tolerance: Patient tolerated treatment well Patient left: in chair;with call bell/phone within reach;with family/visitor present Nurse Communication: Mobility status PT Visit Diagnosis: Other abnormalities of gait and mobility (R26.89);Muscle weakness (generalized) (M62.81);Other symptoms and signs involving the nervous system (Y80.165)     Time: 5374-8270 PT Time Calculation (min) (ACUTE ONLY): 42 min  Charges:  $Gait Training: 23-37 mins $Therapeutic Activity: 8-22 mins                     Arlyss Gandy, PT, DPT Acute Rehabilitation Pager: [phone] Office [phone]    Arlyss Gandy 04/30/2021, 9:28 AM

## 2021-05-03 ENCOUNTER — Encounter (HOSPITAL_COMMUNITY): Payer: Self-pay | Admitting: Neurosurgery

## 2021-05-03 ENCOUNTER — Inpatient Hospital Stay (HOSPITAL_COMMUNITY): Payer: PPO | Admitting: Certified Registered Nurse Anesthetist

## 2021-05-03 ENCOUNTER — Inpatient Hospital Stay (HOSPITAL_COMMUNITY)
Admission: RE | Disposition: A | Discharge status: Home Health | Payer: Self-pay | Source: Ambulatory Visit | Attending: Neurosurgery

## 2021-05-03 ENCOUNTER — Other Ambulatory Visit: Payer: Self-pay

## 2021-05-03 ENCOUNTER — Inpatient Hospital Stay (HOSPITAL_COMMUNITY): Payer: PPO

## 2021-05-03 ENCOUNTER — Inpatient Hospital Stay (HOSPITAL_BASED_OUTPATIENT_CLINIC_OR_DEPARTMENT_OTHER): Payer: PPO | Admitting: Certified Registered Nurse Anesthetist

## 2021-05-03 ENCOUNTER — Other Ambulatory Visit: Payer: Self-pay | Admitting: Neurosurgery

## 2021-05-03 ENCOUNTER — Inpatient Hospital Stay (HOSPITAL_COMMUNITY)
Admission: RE | Admit: 2021-05-03 | Discharge: 2021-05-05 | DRG: 473 | Disposition: A | Discharge status: Home Health | Payer: PPO | Source: Ambulatory Visit | Attending: Neurosurgery | Admitting: Neurosurgery

## 2021-05-03 DIAGNOSIS — M4802 Spinal stenosis, cervical region: Secondary | ICD-10-CM | POA: Diagnosis present

## 2021-05-03 DIAGNOSIS — Z981 Arthrodesis status: Secondary | ICD-10-CM | POA: Diagnosis not present

## 2021-05-03 DIAGNOSIS — R296 Repeated falls: Secondary | ICD-10-CM | POA: Diagnosis present

## 2021-05-03 DIAGNOSIS — I10 Essential (primary) hypertension: Secondary | ICD-10-CM | POA: Diagnosis not present

## 2021-05-03 DIAGNOSIS — M4722 Other spondylosis with radiculopathy, cervical region: Secondary | ICD-10-CM | POA: Diagnosis present

## 2021-05-03 DIAGNOSIS — Z6831 Body mass index (BMI) 31.0-31.9, adult: Secondary | ICD-10-CM

## 2021-05-03 DIAGNOSIS — M21371 Foot drop, right foot: Secondary | ICD-10-CM | POA: Diagnosis not present

## 2021-05-03 DIAGNOSIS — M4712 Other spondylosis with myelopathy, cervical region: Secondary | ICD-10-CM | POA: Diagnosis not present

## 2021-05-03 DIAGNOSIS — K219 Gastro-esophageal reflux disease without esophagitis: Secondary | ICD-10-CM | POA: Diagnosis not present

## 2021-05-03 DIAGNOSIS — M21372 Foot drop, left foot: Secondary | ICD-10-CM | POA: Diagnosis not present

## 2021-05-03 DIAGNOSIS — Z87891 Personal history of nicotine dependence: Secondary | ICD-10-CM

## 2021-05-03 DIAGNOSIS — E7800 Pure hypercholesterolemia, unspecified: Secondary | ICD-10-CM | POA: Diagnosis not present

## 2021-05-03 DIAGNOSIS — M4322 Fusion of spine, cervical region: Secondary | ICD-10-CM | POA: Diagnosis not present

## 2021-05-03 DIAGNOSIS — Z20822 Contact with and (suspected) exposure to covid-19: Secondary | ICD-10-CM | POA: Diagnosis not present

## 2021-05-03 DIAGNOSIS — Z7982 Long term (current) use of aspirin: Secondary | ICD-10-CM | POA: Diagnosis not present

## 2021-05-03 DIAGNOSIS — R269 Unspecified abnormalities of gait and mobility: Secondary | ICD-10-CM | POA: Diagnosis not present

## 2021-05-03 DIAGNOSIS — Z419 Encounter for procedure for purposes other than remedying health state, unspecified: Secondary | ICD-10-CM

## 2021-05-03 DIAGNOSIS — E669 Obesity, unspecified: Secondary | ICD-10-CM | POA: Diagnosis not present

## 2021-05-03 DIAGNOSIS — Z79899 Other long term (current) drug therapy: Secondary | ICD-10-CM | POA: Diagnosis not present

## 2021-05-03 DIAGNOSIS — R531 Weakness: Secondary | ICD-10-CM | POA: Diagnosis not present

## 2021-05-03 DIAGNOSIS — Z8673 Personal history of transient ischemic attack (TIA), and cerebral infarction without residual deficits: Secondary | ICD-10-CM | POA: Diagnosis not present

## 2021-05-03 HISTORY — DX: Unspecified osteoarthritis, unspecified site: M19.90

## 2021-05-03 HISTORY — PX: ANTERIOR CERVICAL DECOMP/DISCECTOMY FUSION: SHX1161

## 2021-05-03 LAB — TYPE AND SCREEN
ABO/RH(D): B POS
Antibody Screen: NEGATIVE

## 2021-05-03 LAB — SARS CORONAVIRUS 2 BY RT PCR (HOSPITAL ORDER, PERFORMED IN ~~LOC~~ HOSPITAL LAB): SARS Coronavirus 2: NEGATIVE

## 2021-05-03 LAB — ABO/RH: ABO/RH(D): B POS

## 2021-05-03 SURGERY — ANTERIOR CERVICAL DECOMPRESSION/DISCECTOMY FUSION 3 LEVELS
Anesthesia: General

## 2021-05-03 MED ORDER — LIDOCAINE 2% (20 MG/ML) 5 ML SYRINGE
INTRAMUSCULAR | Status: DC | PRN
  Administered 2021-05-03: 40 mg via INTRAVENOUS

## 2021-05-03 MED ORDER — BUPIVACAINE-EPINEPHRINE (PF) 0.5% -1:200000 IJ SOLN
INTRAMUSCULAR | Status: DC | PRN
  Administered 2021-05-03: 10 mL via PERINEURAL

## 2021-05-03 MED ORDER — DEXAMETHASONE 4 MG PO TABS
4.0000 mg | ORAL_TABLET | Freq: Four times a day (QID) | ORAL | Status: AC
  Administered 2021-05-03 (×2): 4 mg via ORAL
  Filled 2021-05-03 (×2): qty 1

## 2021-05-03 MED ORDER — BUPIVACAINE-EPINEPHRINE 0.5% -1:200000 IJ SOLN
INTRAMUSCULAR | Status: AC
  Filled 2021-05-03: qty 1

## 2021-05-03 MED ORDER — LIDOCAINE 2% (20 MG/ML) 5 ML SYRINGE
INTRAMUSCULAR | Status: AC
  Filled 2021-05-03: qty 5

## 2021-05-03 MED ORDER — PANTOPRAZOLE SODIUM 40 MG IV SOLR
40.0000 mg | Freq: Every day | INTRAVENOUS | Status: DC
  Administered 2021-05-03 – 2021-05-04 (×2): 40 mg via INTRAVENOUS
  Filled 2021-05-03 (×2): qty 10

## 2021-05-03 MED ORDER — PHENYLEPHRINE HCL-NACL 20-0.9 MG/250ML-% IV SOLN
INTRAVENOUS | Status: DC | PRN
Start: 2021-05-03 — End: 2021-05-03
  Administered 2021-05-03: 20 ug/min via INTRAVENOUS

## 2021-05-03 MED ORDER — ROCURONIUM BROMIDE 10 MG/ML (PF) SYRINGE
PREFILLED_SYRINGE | INTRAVENOUS | Status: DC | PRN
  Administered 2021-05-03: 70 mg via INTRAVENOUS
  Administered 2021-05-03: 20 mg via INTRAVENOUS

## 2021-05-03 MED ORDER — BACITRACIN ZINC 500 UNIT/GM EX OINT
TOPICAL_OINTMENT | CUTANEOUS | Status: DC | PRN
  Administered 2021-05-03: 1 via TOPICAL

## 2021-05-03 MED ORDER — CHLORHEXIDINE GLUCONATE 0.12 % MT SOLN
OROMUCOSAL | Status: AC
  Administered 2021-05-03: 15 mL
  Filled 2021-05-03: qty 15

## 2021-05-03 MED ORDER — CHLORHEXIDINE GLUCONATE CLOTH 2 % EX PADS
6.0000 | MEDICATED_PAD | Freq: Once | CUTANEOUS | Status: DC
Start: 2021-05-03 — End: 2021-05-03

## 2021-05-03 MED ORDER — CEFAZOLIN SODIUM-DEXTROSE 2-4 GM/100ML-% IV SOLN
2.0000 g | Freq: Three times a day (TID) | INTRAVENOUS | Status: AC
  Administered 2021-05-03 – 2021-05-04 (×2): 2 g via INTRAVENOUS
  Filled 2021-05-03 (×2): qty 100

## 2021-05-03 MED ORDER — PHENOL 1.4 % MT LIQD: 1.0000 | OROMUCOSAL | Status: DC | PRN

## 2021-05-03 MED ORDER — DEXAMETHASONE SODIUM PHOSPHATE 4 MG/ML IJ SOLN
4.0000 mg | Freq: Four times a day (QID) | INTRAMUSCULAR | Status: AC
  Filled 2021-05-03: qty 1

## 2021-05-03 MED ORDER — OXYCODONE HCL 5 MG PO TABS
5.0000 mg | ORAL_TABLET | ORAL | Status: DC | PRN
  Administered 2021-05-04 – 2021-05-05 (×2): 5 mg via ORAL
  Filled 2021-05-03 (×2): qty 1

## 2021-05-03 MED ORDER — THROMBIN 5000 UNITS EX SOLR
CUTANEOUS | Status: AC
  Filled 2021-05-03: qty 5000

## 2021-05-03 MED ORDER — ONDANSETRON HCL 4 MG/2ML IJ SOLN
INTRAMUSCULAR | Status: AC
  Filled 2021-05-03: qty 2

## 2021-05-03 MED ORDER — MENTHOL 3 MG MT LOZG: 1.0000 | LOZENGE | OROMUCOSAL | Status: DC | PRN

## 2021-05-03 MED ORDER — PROPOFOL 10 MG/ML IV BOLUS
INTRAVENOUS | Status: DC | PRN
  Administered 2021-05-03: 120 mg via INTRAVENOUS

## 2021-05-03 MED ORDER — ACETAMINOPHEN 325 MG PO TABS: 650.0000 mg | ORAL_TABLET | ORAL | Status: DC | PRN

## 2021-05-03 MED ORDER — DEXAMETHASONE SODIUM PHOSPHATE 10 MG/ML IJ SOLN
INTRAMUSCULAR | Status: AC
  Filled 2021-05-03: qty 1

## 2021-05-03 MED ORDER — FENTANYL CITRATE (PF) 100 MCG/2ML IJ SOLN: 25.0000 ug | INTRAMUSCULAR | Status: DC | PRN

## 2021-05-03 MED ORDER — OXYCODONE HCL 5 MG PO TABS: 10.0000 mg | ORAL_TABLET | ORAL | Status: DC | PRN

## 2021-05-03 MED ORDER — CEFAZOLIN SODIUM-DEXTROSE 2-4 GM/100ML-% IV SOLN
2.0000 g | INTRAVENOUS | Status: AC
  Administered 2021-05-03: 2 g via INTRAVENOUS
  Filled 2021-05-03: qty 100

## 2021-05-03 MED ORDER — EPHEDRINE 5 MG/ML INJ
INTRAVENOUS | Status: AC
  Filled 2021-05-03: qty 5

## 2021-05-03 MED ORDER — CHLORHEXIDINE GLUCONATE CLOTH 2 % EX PADS: 6.0000 | MEDICATED_PAD | Freq: Once | CUTANEOUS | Status: DC

## 2021-05-03 MED ORDER — MORPHINE SULFATE (PF) 4 MG/ML IV SOLN: 4.0000 mg | INTRAVENOUS | Status: DC | PRN

## 2021-05-03 MED ORDER — ACETAMINOPHEN 650 MG RE SUPP: 650.0000 mg | RECTAL | Status: DC | PRN

## 2021-05-03 MED ORDER — ONDANSETRON HCL 4 MG PO TABS: 4.0000 mg | ORAL_TABLET | Freq: Four times a day (QID) | ORAL | Status: DC | PRN

## 2021-05-03 MED ORDER — ATORVASTATIN CALCIUM 10 MG PO TABS
10.0000 mg | ORAL_TABLET | Freq: Every evening | ORAL | Status: DC
  Administered 2021-05-03 – 2021-05-04 (×2): 10 mg via ORAL
  Filled 2021-05-03 (×2): qty 1

## 2021-05-03 MED ORDER — CYCLOBENZAPRINE HCL 10 MG PO TABS: 10.0000 mg | ORAL_TABLET | Freq: Three times a day (TID) | ORAL | Status: DC | PRN

## 2021-05-03 MED ORDER — ALUM & MAG HYDROXIDE-SIMETH 200-200-20 MG/5ML PO SUSP: 30.0000 mL | Freq: Four times a day (QID) | ORAL | Status: DC | PRN

## 2021-05-03 MED ORDER — ACETAMINOPHEN 500 MG PO TABS
1000.0000 mg | ORAL_TABLET | Freq: Four times a day (QID) | ORAL | Status: AC
  Administered 2021-05-03 – 2021-05-04 (×4): 1000 mg via ORAL
  Filled 2021-05-03 (×4): qty 2

## 2021-05-03 MED ORDER — LACTATED RINGERS IV SOLN: INTRAVENOUS | Status: DC

## 2021-05-03 MED ORDER — RAMIPRIL 5 MG PO CAPS
10.0000 mg | ORAL_CAPSULE | Freq: Every morning | ORAL | Status: DC
  Administered 2021-05-04 – 2021-05-05 (×2): 10 mg via ORAL
  Filled 2021-05-03 (×2): qty 2

## 2021-05-03 MED ORDER — FENTANYL CITRATE (PF) 250 MCG/5ML IJ SOLN
INTRAMUSCULAR | Status: DC | PRN
  Administered 2021-05-03: 50 ug via INTRAVENOUS
  Administered 2021-05-03: 150 ug via INTRAVENOUS
  Administered 2021-05-03: 50 ug via INTRAVENOUS

## 2021-05-03 MED ORDER — THROMBIN 5000 UNITS EX SOLR
OROMUCOSAL | Status: DC | PRN
  Administered 2021-05-03: 5 mL via TOPICAL

## 2021-05-03 MED ORDER — BISACODYL 10 MG RE SUPP: 10.0000 mg | Freq: Every day | RECTAL | Status: DC | PRN

## 2021-05-03 MED ORDER — PROPOFOL 10 MG/ML IV BOLUS
INTRAVENOUS | Status: AC
  Filled 2021-05-03: qty 20

## 2021-05-03 MED ORDER — LACTATED RINGERS IV SOLN
INTRAVENOUS | Status: DC | PRN
Start: 2021-05-03 — End: 2021-05-03

## 2021-05-03 MED ORDER — FINASTERIDE 5 MG PO TABS
5.0000 mg | ORAL_TABLET | Freq: Every day | ORAL | Status: DC
  Administered 2021-05-04 – 2021-05-05 (×2): 5 mg via ORAL
  Filled 2021-05-03 (×2): qty 1

## 2021-05-03 MED ORDER — FENTANYL CITRATE (PF) 250 MCG/5ML IJ SOLN
INTRAMUSCULAR | Status: AC
  Filled 2021-05-03: qty 5

## 2021-05-03 MED ORDER — ONDANSETRON HCL 4 MG/2ML IJ SOLN: 4.0000 mg | Freq: Four times a day (QID) | INTRAMUSCULAR | Status: DC | PRN

## 2021-05-03 MED ORDER — ACETAMINOPHEN 500 MG PO TABS
1000.0000 mg | ORAL_TABLET | Freq: Once | ORAL | Status: AC
  Administered 2021-05-03: 1000 mg via ORAL
  Filled 2021-05-03: qty 2

## 2021-05-03 MED ORDER — 0.9 % SODIUM CHLORIDE (POUR BTL) OPTIME
TOPICAL | Status: DC | PRN
  Administered 2021-05-03: 1000 mL

## 2021-05-03 MED ORDER — DOCUSATE SODIUM 100 MG PO CAPS
100.0000 mg | ORAL_CAPSULE | Freq: Two times a day (BID) | ORAL | Status: DC
  Administered 2021-05-03 – 2021-05-05 (×4): 100 mg via ORAL
  Filled 2021-05-03 (×4): qty 1

## 2021-05-03 MED ORDER — BACITRACIN ZINC 500 UNIT/GM EX OINT
TOPICAL_OINTMENT | CUTANEOUS | Status: AC
  Filled 2021-05-03: qty 28.35

## 2021-05-03 MED ORDER — ROCURONIUM BROMIDE 10 MG/ML (PF) SYRINGE
PREFILLED_SYRINGE | INTRAVENOUS | Status: AC
  Filled 2021-05-03: qty 10

## 2021-05-03 MED ORDER — EPHEDRINE SULFATE-NACL 50-0.9 MG/10ML-% IV SOSY
PREFILLED_SYRINGE | INTRAVENOUS | Status: DC | PRN
  Administered 2021-05-03: 10 mg via INTRAVENOUS

## 2021-05-03 MED ORDER — DEXAMETHASONE SODIUM PHOSPHATE 10 MG/ML IJ SOLN
INTRAMUSCULAR | Status: DC | PRN
  Administered 2021-05-03: 10 mg via INTRAVENOUS

## 2021-05-03 MED ORDER — THROMBIN 20000 UNITS EX SOLR
CUTANEOUS | Status: AC
  Filled 2021-05-03: qty 20000

## 2021-05-03 MED ORDER — ONDANSETRON HCL 4 MG/2ML IJ SOLN
INTRAMUSCULAR | Status: DC | PRN
  Administered 2021-05-03: 4 mg via INTRAVENOUS

## 2021-05-03 MED ORDER — SUGAMMADEX SODIUM 200 MG/2ML IV SOLN
INTRAVENOUS | Status: DC | PRN
Start: 2021-05-03 — End: 2021-05-03
  Administered 2021-05-03 (×2): 100 mg via INTRAVENOUS

## 2021-05-03 SURGICAL SUPPLY — 71 items
APL SKNCLS STERI-STRIP NONHPOA (GAUZE/BANDAGES/DRESSINGS) ×1
BAG COUNTER SPONGE SURGICOUNT (BAG) ×4 IMPLANT
BAG SPNG CNTER NS LX DISP (BAG) ×2
BAND INSRT 18 STRL LF DISP RB (MISCELLANEOUS)
BAND RUBBER #18 3X1/16 STRL (MISCELLANEOUS) IMPLANT
BENZOIN TINCTURE PRP APPL 2/3 (GAUZE/BANDAGES/DRESSINGS) ×5 IMPLANT
BIT DRILL NEURO 2X3.1 SFT TUCH (MISCELLANEOUS) ×2 IMPLANT
BLADE 15 SAFETY STRL DISP (BLADE) ×1 IMPLANT
BLADE SURG 15 STRL LF DISP TIS (BLADE) ×2 IMPLANT
BLADE SURG 15 STRL SS (BLADE) ×2
BLADE ULTRA TIP 2M (BLADE) ×3 IMPLANT
BUR BARREL STRAIGHT FLUTE 4.0 (BURR) ×4 IMPLANT
BUR MATCHSTICK NEURO 3.0 LAGG (BURR) ×3 IMPLANT
CANISTER SUCT 3000ML PPV (MISCELLANEOUS) ×3 IMPLANT
CARTRIDGE OIL MAESTRO DRILL (MISCELLANEOUS) ×2 IMPLANT
COVER MAYO STAND STRL (DRAPES) ×3 IMPLANT
DEVICE FUSION VISTA 14X14X9MM (Spacer) IMPLANT
DIFFUSER DRILL AIR PNEUMATIC (MISCELLANEOUS) ×3 IMPLANT
DRAIN JACKSON PRATT 10MM FLAT (MISCELLANEOUS) ×1 IMPLANT
DRAPE LAPAROTOMY 100X72 PEDS (DRAPES) ×3 IMPLANT
DRAPE MICROSCOPE LEICA (MISCELLANEOUS) IMPLANT
DRAPE SURG 17X23 STRL (DRAPES) ×6 IMPLANT
DRILL NEURO 2X3.1 SOFT TOUCH (MISCELLANEOUS) ×2
DRSG OPSITE POSTOP 3X4 (GAUZE/BANDAGES/DRESSINGS) ×3 IMPLANT
DRSG OPSITE POSTOP 4X6 (GAUZE/BANDAGES/DRESSINGS) ×1 IMPLANT
ELECT BLADE 4.0 EZ CLEAN MEGAD (MISCELLANEOUS) ×2
ELECT COATED BLADE 2.86 ST (ELECTRODE) ×1 IMPLANT
ELECT REM PT RETURN 9FT ADLT (ELECTROSURGICAL) ×2
ELECTRODE BLDE 4.0 EZ CLN MEGD (MISCELLANEOUS) IMPLANT
ELECTRODE REM PT RTRN 9FT ADLT (ELECTROSURGICAL) ×2 IMPLANT
EVACUATOR SILICONE 100CC (DRAIN) ×1 IMPLANT
GAUZE 4X4 16PLY ~~LOC~~+RFID DBL (SPONGE) IMPLANT
GLOVE EXAM NITRILE XL STR (GLOVE) IMPLANT
GLOVE SURG ENC MOIS LTX SZ8 (GLOVE) ×3 IMPLANT
GLOVE SURG ENC MOIS LTX SZ8.5 (GLOVE) ×3 IMPLANT
GLOVE SURG LTX SZ7 (GLOVE) ×1 IMPLANT
GLOVE SURG POLYISO LF SZ6 (GLOVE) ×1 IMPLANT
GLOVE SURG POLYISO LF SZ8 (GLOVE) ×2 IMPLANT
GLOVE SURG UNDER POLY LF SZ6.5 (GLOVE) ×1 IMPLANT
GLOVE SURG UNDER POLY LF SZ7 (GLOVE) ×2 IMPLANT
GOWN STRL REUS W/ TWL LRG LVL3 (GOWN DISPOSABLE) IMPLANT
GOWN STRL REUS W/ TWL XL LVL3 (GOWN DISPOSABLE) ×2 IMPLANT
GOWN STRL REUS W/TWL LRG LVL3 (GOWN DISPOSABLE) ×4
GOWN STRL REUS W/TWL XL LVL3 (GOWN DISPOSABLE) ×6
HEMOSTAT POWDER KIT SURGIFOAM (HEMOSTASIS) ×4 IMPLANT
KIT BASIN OR (CUSTOM PROCEDURE TRAY) ×3 IMPLANT
KIT TURNOVER KIT B (KITS) ×3 IMPLANT
MARKER SKIN DUAL TIP RULER LAB (MISCELLANEOUS) ×3 IMPLANT
NDL SPNL 18GX3.5 QUINCKE PK (NEEDLE) ×2 IMPLANT
NEEDLE HYPO 22GX1.5 SAFETY (NEEDLE) ×3 IMPLANT
NEEDLE SPNL 18GX3.5 QUINCKE PK (NEEDLE) ×2 IMPLANT
NS IRRIG 1000ML POUR BTL (IV SOLUTION) ×3 IMPLANT
OIL CARTRIDGE MAESTRO DRILL (MISCELLANEOUS) ×2
PACK LAMINECTOMY NEURO (CUSTOM PROCEDURE TRAY) ×3 IMPLANT
PATTIES SURGICAL 1X1 (DISPOSABLE) ×1 IMPLANT
PEEK VISTA 14X14X7MM (Peek) ×1 IMPLANT
PEEK VISTA 14X14X8MM (Peek) ×1 IMPLANT
PIN DISTRACTION 14MM (PIN) ×6 IMPLANT
PLATE ANT CERV XTEND 3 LV 45 (Plate) ×1 IMPLANT
PUTTY DBM 5CC CALC GRAN (Putty) ×1 IMPLANT
SCREW XTD VAR 4.2 SELF TAP (Screw) ×8 IMPLANT
SPONGE INTESTINAL PEANUT (DISPOSABLE) ×6 IMPLANT
SPONGE SURGIFOAM ABS GEL 100 (HEMOSTASIS) IMPLANT
STRIP CLOSURE SKIN 1/2X4 (GAUZE/BANDAGES/DRESSINGS) ×3 IMPLANT
SUT VIC AB 0 CT1 27 (SUTURE) ×2
SUT VIC AB 0 CT1 27XBRD ANTBC (SUTURE) ×2 IMPLANT
SUT VIC AB 3-0 SH 8-18 (SUTURE) ×3 IMPLANT
TOWEL GREEN STERILE (TOWEL DISPOSABLE) ×3 IMPLANT
TOWEL GREEN STERILE FF (TOWEL DISPOSABLE) ×3 IMPLANT
VISTA 14X14X9MM (Spacer) ×2 IMPLANT
WATER STERILE IRR 1000ML POUR (IV SOLUTION) ×3 IMPLANT

## 2021-05-03 NOTE — Progress Notes (Signed)
Orthopedic Tech Progress Note Patient Details:  [name] 1933/04/05 [id number]  Called in order to [employer] for an ASPEN CERVICAL COLLAR    Patient ID: Terry Gray, male   DOB: 12-Mar-1933, 88 y.o.   MRN: [id number]  Donald Pore 05/03/2021, 3:45 PM

## 2021-05-03 NOTE — Anesthesia Postprocedure Evaluation (Signed)
Anesthesia Post Note  Patient: Terry Gray  Procedure(s) Performed: ANTERIOR CERVICAL DECOMPRESSION /DISCECTOMY Trinidad Curet PROSTHESIS ,PLATE/SCREWS CERVICAL FOUR- FIVE, CERVICAL FIVE- SIX, CERVICAL SIX- SEVEN     Patient location during evaluation: PACU Anesthesia Type: General Level of consciousness: awake and alert, patient cooperative and oriented Pain management: pain level controlled Vital Signs Assessment: post-procedure vital signs reviewed and stable Respiratory status: spontaneous breathing, nonlabored ventilation and respiratory function stable Cardiovascular status: blood pressure returned to baseline and stable Postop Assessment: no apparent nausea or vomiting and adequate PO intake Anesthetic complications: no   No notable events documented.  Last Vitals:  Vitals:   05/03/21 1450 05/03/21 1505  BP: 127/61 (!) 133/58  Pulse: 65 66  Resp: 20 20  Temp: 37.2 C   SpO2: 100% 96%    Last Pain:  Vitals:   05/03/21 1450  TempSrc:   PainSc: 0-No pain                 Braven Wolk,E. Karyl Sharrar

## 2021-05-03 NOTE — Anesthesia Procedure Notes (Signed)
Procedure Name: Intubation Date/Time: 05/03/2021 11:11 AM Performed by: Demetrio Lapping, CRNA Pre-anesthesia Checklist: Patient identified, Emergency Drugs available, Suction available and Patient being monitored Patient Re-evaluated:Patient Re-evaluated prior to induction Oxygen Delivery Method: Circle System Utilized Preoxygenation: Pre-oxygenation with 100% oxygen Induction Type: IV induction Ventilation: Mask ventilation without difficulty Laryngoscope Size: Glidescope and 4 Grade View: Grade I Tube type: Oral Number of attempts: 1 Airway Equipment and Method: Rigid stylet Placement Confirmation: ETT inserted through vocal cords under direct vision, positive ETCO2 and breath sounds checked- equal and bilateral Secured at: 22 cm Tube secured with: Tape Dental Injury: Teeth and Oropharynx as per pre-operative assessment

## 2021-05-03 NOTE — Anesthesia Preprocedure Evaluation (Addendum)
Anesthesia Evaluation  Patient identified by MRN, date of birth, ID band Patient awake    Reviewed: Allergy & Precautions, NPO status , Patient's Chart, lab work & pertinent test results  History of Anesthesia Complications Negative for: history of anesthetic complications  Airway Mallampati: II  TM Distance: >3 FB Neck ROM: Full    Dental  (+) Poor Dentition, Chipped, Loose, Dental Advisory Given   Pulmonary former smoker,  04/28/2021 SARS coronavirus NEG   breath sounds clear to auscultation       Cardiovascular hypertension, Pt. on medications (-) angina+ dysrhythmias Atrial Fibrillation  Rhythm:Regular Rate:Normal  '15 Normal myovue study with no evidence of ischemia or infarction   Neuro/Psych Chronic low back pain TIA   GI/Hepatic GERD  Controlled,(+) Hepatitis -  Endo/Other  obese  Renal/GU negative Renal ROS     Musculoskeletal  (+) Arthritis ,   Abdominal (+) + obese,   Peds  Hematology negative hematology ROS (+)   Anesthesia Other Findings   Reproductive/Obstetrics                            Anesthesia Physical Anesthesia Plan  ASA: 3  Anesthesia Plan: General   Post-op Pain Management: Tylenol PO (pre-op)*   Induction: Intravenous  PONV Risk Score and Plan: 2 and Ondansetron and Dexamethasone  Airway Management Planned: Oral ETT and Video Laryngoscope Planned  Additional Equipment: None  Intra-op Plan:   Post-operative Plan: Extubation in OR  Informed Consent: I have reviewed the patients History and Physical, chart, labs and discussed the procedure including the risks, benefits and alternatives for the proposed anesthesia with the patient or authorized representative who has indicated his/her understanding and acceptance.     Dental advisory given  Plan Discussed with: CRNA and Surgeon  Anesthesia Plan Comments:        Anesthesia Quick Evaluation

## 2021-05-03 NOTE — Op Note (Signed)
Brief history: The patient is an 88 year old [place] immigrant who has complained of weakness, ataxia, falling, etc.  He presented with a severe cervical myelopathy.  He was worked up with a cervical MRI which demonstrated severe stenosis at C4-5, C5-6 and C6-7.  I discussed the various treatment options with him and recommended surgery.  He has decided proceed with a three-level anterior cervicectomy fusion and plating.  Preoperative diagnosis: Cervical spondylosis, cervical stenosis, cervical myelopathy, cervical radiculopathy, cervicalgia  Postoperative diagnosis: The same  Procedure: C4-5, C5-6 and C6-7 anterior cervical discectomy/decompression; C4-5, C5-6 and C6-7 interbody arthrodesis with local morcellized autograft bone and Zimmer DBM; insertion of interbody prosthesis at C4-5, C5-6 and C6-7 (Zimmer peek interbody prosthesis); anterior cervical plating from C4-C7 with globus titanium plate  Surgeon: Dr. Delma Officer  Asst.: Hildred Priest, NP  Anesthesia: Gen. endotracheal  Estimated blood loss: 125 cc  Drains: Jackson-Pratt drain in the prevertebral space  Complications: None  Description of procedure: The patient was brought to the operating room by the anesthesia team. General endotracheal anesthesia was induced. A roll was placed under the patient's shoulders to keep the neck in the neutral position. The patient's anterior cervical region was then prepared with Betadine scrub and Betadine solution. Sterile drapes were applied.  The area to be incised was then injected with Marcaine with epinephrine solution. I then used a scalpel to make a transverse incision in the patient's left anterior neck. I used the Metzenbaum scissors to divide the platysmal muscle and then to dissect medial to the sternocleidomastoid muscle, jugular vein, and carotid artery. I carefully dissected down towards the anterior cervical spine identifying the esophagus and retracting it medially. Then using  Kitner swabs to clear soft tissue from the anterior cervical spine. We then inserted a bent spinal needle into the upper exposed intervertebral disc space. We then obtained intraoperative radiographs confirm our location.  I then used electrocautery to detach the medial border of the longus colli muscle bilaterally from the C4-5, C5-6 and C6-7 intervertebral disc spaces. I then inserted the Caspar self-retaining retractor underneath the longus colli muscle bilaterally to provide exposure.  We then incised the intervertebral disc at C4-5. We then performed a partial intervertebral discectomy with a pituitary forceps and the Karlin curettes. I then inserted distraction screws into the vertebral bodies at C4-5. We then distracted the interspace. We then used the high-speed drill to decorticate the vertebral endplates at C4-5, to drill away the remainder of the intervertebral disc, to drill away some posterior spondylosis, and to thin out the posterior longitudinal ligament. I then incised ligament with the arachnoid knife. We then removed the ligament with a Kerrison punches undercutting the vertebral endplates and decompressing the thecal sac. We then performed foraminotomies about the bilateral C5 nerve roots. This completed the decompression at this level.  We then repeated this procedure in analogous fashion and C5-6 and C6-7 decompressing the thecal sac and the bilateral C6 and C7 nerve roots.  We now turned our to attention to the interbody fusion. We used the trial spacers to determine the appropriate size for the interbody prosthesis. We then pre-filled prosthesis with a combination of local morcellized autograft bone that we obtained during decompression as well as Zimmer DBM. We then inserted the prosthesis into the distracted interspace at C4-5, C5-6 and C6-7. We then removed the distraction screws. There was a good snug fit of the prosthesis in the interspace.  Having completed the fusion we now  turned attention to the anterior spinal  instrumentation. We used the high-speed drill to drill away some anterior spondylosis at the disc spaces so that the plate lay down flat. We selected the appropriate length titanium anterior cervical plate. We laid it along the anterior aspect of the vertebral bodies from C4-C7. We then drilled 14 mm holes at C4, C5, C6 and C7. We then secured the plate to the vertebral bodies by placing two 14 mm self-tapping screws at C4, C5, C6 and C7. We then obtained intraoperative radiograph. The demonstrating good position of the instrumentation. We therefore secured the screws the plate the locking each cam. This completed the instrumentation.  We then obtained hemostasis using bipolar electrocautery. We irrigated the wound out with bacitracin solution. We then removed the retractor. We inspected the esophagus for any damage. There was none apparent.  We placed a 10 mm flat Jackson-Pratt drain in the prevertebral space and tunneled it out through separate stab wound.  We then reapproximated patient's platysmal muscle with interrupted 3-0 Vicryl suture. We then reapproximated the subcutaneous tissue with interrupted 3-0 Vicryl suture. The skin was reapproximated with Steri-Strips and benzoin. The wound was then covered with bacitracin ointment. A sterile dressing was applied. The drapes were removed. Patient was subsequently extubated by the anesthesia team and transported to the post anesthesia care unit in stable condition. All sponge instrument and needle counts were reportedly correct at the end of this case.

## 2021-05-03 NOTE — Transfer of Care (Signed)
Immediate Anesthesia Transfer of Care Note  Patient: Terry Gray  Procedure(s) Performed: ANTERIOR CERVICAL DECOMPRESSION /DISCECTOMY Trinidad Curet PROSTHESIS ,PLATE/SCREWS CERVICAL FOUR- FIVE, CERVICAL FIVE- SIX, CERVICAL SIX- SEVEN  Patient Location: PACU  Anesthesia Type:General  Level of Consciousness: drowsy and patient cooperative  Airway & Oxygen Therapy: Patient Spontanous Breathing and Patient connected to face mask oxygen  Post-op Assessment: Report given to RN and Post -op Vital signs reviewed and stable  Post vital signs: Reviewed and stable  Last Vitals:  Vitals Value Taken Time  BP 127/61 05/03/21 1450  Temp    Pulse 61 05/03/21 1452  Resp 17 05/03/21 1452  SpO2 99 % 05/03/21 1452  Vitals shown include unvalidated device data.  Last Pain:  Vitals:   05/03/21 0848  TempSrc:   PainSc: 0-No pain         Complications: No notable events documented.

## 2021-05-03 NOTE — H&P (Signed)
Subjective: The patient is a 88 year old Terry Gray presented with frequent falls, bilateral foot drops, cervical myelopathy, etc.  He was worked up with a total spine MRI which demonstrated severe stenosis at C4-5, C5-6 and C6-7 as well as diffuse degenerative changes.  I discussed the various treatment with him.  I recommended surgery.  He has decided proceed with a three-level anterior cervical discectomy fusion and plating.  Past Medical History:  Diagnosis Date   Arthritis    Chronic lower back pain    Complication of anesthesia    "blood pressure dropped low in dentist office during dental work"- one 1 time   DOE (dyspnea on exertion)    Dysrhythmia    Hx. intermittent Atrial Fibrilllation   GERD (gastroesophageal reflux disease)    04/30/21- not current   Hepatitis B ~ 2013   "suspected; [facility]"   High cholesterol    Hypertension    New onset atrial fibrillation ([place]) 07/05/2013   Shortness of breath    Stroke ([place]) ~ 2012   residual right side decreased sensation   TIA (transient ischemic attack) 03/07/2010   of unknown origin/notes 07/05/2013; "I've had 3 or 4; call it stroke sometimes; TIA sometimes"   Vertigo 07/05/2013    Past Surgical History:  Procedure Laterality Date   CAPSULOTOMY  04/03/2012   Procedure: MINOR CAPSULOTOMY;  Surgeon: Myrtha Mantis., MD;  Location: [place];  Service: Ophthalmology;  Laterality: Right;   CATARACT EXTRACTION W/PHACO  02/01/2012   Procedure: CATARACT EXTRACTION PHACO AND INTRAOCULAR LENS PLACEMENT (IOC);  Surgeon: Adonis Brook, MD;  Location: [place];  Service: Ophthalmology;  Laterality: Right;   COLONOSCOPY WITH PROPOFOL N/A 11/28/2013   Procedure: COLONOSCOPY WITH PROPOFOL;  Surgeon: Garlan Fair, MD;  Location: [facility];  Service: Endoscopy;  Laterality: N/A;   ESOPHAGOGASTRODUODENOSCOPY (EGD) WITH PROPOFOL N/A 11/28/2013   Procedure: ESOPHAGOGASTRODUODENOSCOPY (EGD) WITH PROPOFOL;  Surgeon:  Garlan Fair, MD;  Location: [facility];  Service: Endoscopy;  Laterality: N/A;   EYE SURGERY     YAG LASER APPLICATION  0000000   Procedure: YAG LASER APPLICATION;  Surgeon: Myrtha Mantis., MD;  Location: [place];  Service: Ophthalmology;  Laterality: N/A;    No Known Allergies  Social History   Tobacco Use   Smoking status: Former    Types: Cigarettes    Quit date: 1968    Years since quitting: 55.1   Smokeless tobacco: Never   Tobacco comments:    Smoked in college  a little  Substance Use Topics   Alcohol use: Yes    Comment: Beer- ocassional    History reviewed. No pertinent family history. Prior to Admission medications   Medication Sig Start Date End Date Taking? Authorizing Provider  aspirin EC 81 MG tablet Take 81 mg by mouth daily. Swallow whole.   Yes [provider]  atorvastatin (LIPITOR) 10 MG tablet Take 10 mg by mouth every evening. 04/19/21  Yes [provider]  docusate sodium (COLACE) 100 MG capsule Take 100 mg by mouth daily. 04/19/21  Yes [provider]  finasteride (PROSCAR) 5 MG tablet Take 5 mg by mouth daily.   Yes [provider]  ramipril (ALTACE) 10 MG capsule Take 10 mg by mouth every morning.  05/23/13  Yes [provider]  Vitamin D, Ergocalciferol, (DRISDOL) 1.25 MG (50000 UNIT) CAPS capsule Take 50,000 Units by mouth 2 (two) times a week.   Yes [provider]  Review of Systems  Positive ROS: As above  All other systems have been reviewed and were otherwise negative with the exception of those mentioned in the HPI and as above.  Objective: Vital signs in last 24 hours: Temp:  [98.4 F (36.9 C)] 98.4 F (36.9 C) (02/27 0833) Pulse Rate:  [67] 67 (02/27 0833) Resp:  [17] 17 (02/27 0833) BP: (157)/(64) 157/64 (02/27 0833) SpO2:  [96 %] 96 % (02/27 0833) Weight:  [67.6 kg] 67.6 kg (02/27 0833) Estimated body mass index is 31.15 kg/m as calculated from the following:    Height as of this encounter: 4\' 10"  (1.473 m).   Weight as of this encounter: 67.6 kg.   General Appearance: Alert Head: Normocephalic, without obvious abnormality, atraumatic Eyes: PERRL, conjunctiva/corneas clear, EOM's intact,    Ears: Normal  Throat: Normal  Neck: Decreased cervical range of motion Back: unremarkable Lungs: Clear to auscultation bilaterally, respirations unlabored Heart: Regular rate and rhythm, no murmur, rub or gallop Abdomen: Soft, non-tender Extremities: Extremities normal, atraumatic, no cyanosis or edema Skin: unremarkable  NEUROLOGIC:   Mental status: alert and oriented,Motor Exam -bilateral hand weakness and bilateral foot drops sensory Exam - grossly normal Reflexes: Hyperreflexia, ankle clonus Coordination - grossly normal Gait -unsteady gait Balance - grossly normal Cranial Nerves: I: smell Not tested  II: visual acuity  OS: Normal  OD: Normal   II: visual fields Full to confrontation  II: pupils Equal, round, reactive to light  III,VII: ptosis None  III,IV,VI: extraocular muscles  Full ROM  V: mastication Normal  V: facial light touch sensation  Normal  V,VII: corneal reflex  Present  VII: facial muscle function - upper  Normal  VII: facial muscle function - lower Normal  VIII: hearing Not tested  IX: soft palate elevation  Normal  IX,X: gag reflex Present  XI: trapezius strength  5/5  XI: sternocleidomastoid strength 5/5  XI: neck flexion strength  5/5  XII: tongue strength  Normal    Data Review Lab Results  Component Value Date   WBC 5.7 04/29/2021   HGB 12.9 (L) 04/29/2021   HCT 39.0 04/29/2021   MCV 85.2 04/29/2021   PLT 115 (L) 04/29/2021   Lab Results  Component Value Date   NA 139 04/29/2021   K 3.9 04/29/2021   CL 106 04/29/2021   CO2 26 04/29/2021   BUN 8 04/29/2021   CREATININE 0.83 04/29/2021   GLUCOSE 98 04/29/2021   Lab Results  Component Value Date   INR 1.04 08/04/2010    Assessment/Plan: Cervical  spondylosis, cervical stenosis, cervical myelopathy, cervicalgia: I have discussed the situation with the patient.  We have reviewed his imaging studies with him and pointed out the abnormalities.  We have discussed the various treatment options including surgery.  I have recommended a C4-5, C5-6 and C6-7 anterior cervical discectomy, fusion and plating.  We have discussed the surgery, he was given a surgical pamphlet.  We have discussed the risk, benefits, alternatives, expected postoperative course, and likelihood of achieving our goals with surgery.  I have answered all his questions.  He wants to proceed with surgery.   Terry Gray 05/03/2021 10:19 AM

## 2021-05-03 NOTE — Progress Notes (Signed)
° °  Providing Compassionate, Quality Care - Together   Subjective: Patient assessed in the PACU. He reports some tingling in his feet.  Objective: Vital signs in last 24 hours: Temp:  [98.4 F (36.9 C)-98.9 F (37.2 C)] 98.9 F (37.2 C) (02/27 1520) Pulse Rate:  [64-72] 64 (02/27 1535) Resp:  [17-20] 20 (02/27 1535) BP: (125-157)/(58-72) 125/72 (02/27 1535) SpO2:  [95 %-100 %] 95 % (02/27 1535) Weight:  [67.6 kg] 67.6 kg (02/27 0833)  Intake/Output from previous day: No intake/output data recorded. Intake/Output this shift: Total I/O In: 1400 [I.V.:1400] Out: 210 [Drains:10; Blood:200]  Alert and oriented x 4 PERRLA CN II-XII grossly intact MAE Incision is covered with Honeycomb dressing and Steri Strips; Dressing is clean, dry, and intact JP drain in place  Lab Results: No results for input(s): WBC, HGB, HCT, PLT in the last 72 hours. BMET No results for input(s): NA, K, CL, CO2, GLUCOSE, BUN, CREATININE, CALCIUM in the last 72 hours.  Studies/Results: DG Cervical Spine 2 or 3 views  Result Date: 05/03/2021 CLINICAL DATA:  C4-C7 ACDF EXAM: CERVICAL SPINE - 2-3 VIEW COMPARISON:  None. FINDINGS: Sequential intraoperative lateral radiographs of the cervical spine demonstrate marking instrument at the C3-C4 disc space and subsequently anterior cervical discectomy and fusion of C4 through C7. IMPRESSION: Intraoperative lateral radiographs demonstrate marking instrument at the C3-C4 disc space and subsequently anterior cervical discectomy and fusion of C4 through C7. Electronically Signed   By: Jearld Lesch M.D.   On: 05/03/2021 14:30    Assessment/Plan: Patient underwent C4-5, C5-6 and C6-7 anterior cervical discectomy/decompression by Dr. Lovell Sheehan on 05/03/2021.   LOS: 0 days   -Admit to 3C -PT and OT to assess   Val Eagle, DNP, AGNP-C Nurse Practitioner  [facility] [address], [place], [place] [address] P:  [phone]     F: [phone]  05/03/2021, 3:41 PM    .

## 2021-05-04 ENCOUNTER — Encounter (HOSPITAL_COMMUNITY): Payer: Self-pay | Admitting: Neurosurgery

## 2021-05-04 MED FILL — Thrombin For Soln 5000 Unit: CUTANEOUS | Qty: 5000 | Status: AC

## 2021-05-04 NOTE — Evaluation (Signed)
Physical Therapy Re Evaluation Patient Details Name: Terry Gray MRN: [id number] DOB: February 10, 1934 Today's Date: 05/04/2021  History of Present Illness  88 y.o. male presents to [facility] hospital on 04/28/2021 with BLE weakness and falls. MRI demonstrates severe spinal stenosis at C5-7. Pt underwent C4-C5, C5-C6, C6-C7 ACDF.  PMH includes HTN, HLD, DOE, GERD, CVA.  Clinical Impression  Pt admitted with above diagnosis. Pt with mild dizziness in standing and with ambulation, BP stable but pt with some anxiety about brace and feeling restricted. Pt needed mod A for sit>stand initially, progressed to min A with further practice. Pt ambulated 30' with RW needing mod A with turning, limited by foot drop and balance impairment. Would benefit from CIR at d/c to be able to return home and be independent within his home. He is from home with wife and children are involved as well.  Pt currently with functional limitations due to the deficits listed below (see PT Problem List). Pt will benefit from skilled PT to increase their independence and safety with mobility to allow discharge to the venue listed below.          Recommendations for follow up therapy are one component of a multi-disciplinary discharge planning process, led by the attending physician.  Recommendations may be updated based on patient status, additional functional criteria and insurance authorization.  Follow Up Recommendations Home health PT    Assistance Recommended at Discharge Intermittent Supervision/Assistance  Patient can return home with the following  A little help with walking and/or transfers;A little help with bathing/dressing/bathroom;Help with stairs or ramp for entrance;Assist for transportation;Assistance with Glass blower/designer (measurements PT);BSC/3in1  Recommendations for Other Services       Functional Status Assessment Patient has had a recent decline in their functional  status and demonstrates the ability to make significant improvements in function in a reasonable and predictable amount of time.     Precautions / Restrictions Precautions Precautions: Cervical;Fall Precaution Booklet Issued: No Precaution Comments: PT verbally reviewed back precautions Required Braces or Orthoses: Cervical Brace Cervical Brace: Hard collar;Other (comment) (ok to be off in bed, in sitting, to shower) Restrictions Weight Bearing Restrictions: No Other Position/Activity Restrictions: anterior cervical JP drain      Mobility  Bed Mobility Overal bed mobility: Needs Assistance             General bed mobility comments: pt received in chair    Transfers Overall transfer level: Needs assistance Equipment used: Rolling walker (2 wheels) Transfers: Sit to/from Stand Sit to Stand: Min assist, Mod assist           General transfer comment: needed mod A for first sit>stand, as well as cues for hand and foot placement, had difficulty with sequencing. Latter 2 times he stood was able to stand with min A and increased time    Ambulation/Gait Ambulation/Gait assistance: Min assist, Mod assist Gait Distance (Feet): 30 Feet Assistive device: Rolling walker (2 wheels) Gait Pattern/deviations: Step-through pattern, Decreased dorsiflexion - right, Decreased dorsiflexion - left Gait velocity: reduced Gait velocity interpretation: <1.31 ft/sec, indicative of household ambulator   General Gait Details: B hip hike R>L due to B foot drop. Did not note knee hyperextension during gait today. Pt noted mild dizziness with standing and ambulation, BP stable. Mod A needed during turning  Stairs            Wheelchair Mobility    Modified Rankin (Stroke Patients Only)       Balance  Overall balance assessment: Needs assistance Sitting-balance support: Feet supported Sitting balance-Leahy Scale: Good     Standing balance support: Reliant on assistive device for  balance, Bilateral upper extremity supported Standing balance-Leahy Scale: Poor Standing balance comment: Pt must have walker.  Limited BLE dorsiflextion                             Pertinent Vitals/Pain Pain Assessment Pain Assessment: Faces Faces Pain Scale: Hurts little more Pain Location: neck Pain Descriptors / Indicators: Aching, Operative site guarding Pain Intervention(s): Limited activity within patient's tolerance, Monitored during session    Home Living Family/patient expects to be discharged to:: Private residence Living Arrangements: Spouse/significant other;Children Available Help at Discharge: Family;Available 24 hours/day Type of Home: House Home Access: Stairs to enter Entrance Stairs-Rails: None Entrance Stairs-Number of Steps: 3 Alternate Level Stairs-Number of Steps: 6 Home Layout: Multi-level Home Equipment: Rollator (4 wheels);Rolling Walker (2 wheels);Cane - single point;BSC/3in1;Shower seat;Wheelchair - manual Additional Comments: Pt does not use w/c inside. House is too small.    Prior Function Prior Level of Function : Needs assist             Mobility Comments: pt reports ambulating with a RW for household distances up until 2 weeks ago. He does not drive anymore (spouse does not either), kids take him to appts ADLs Comments: Pt was doing most adls with mod I but is not cooking, cleaning or driving.     Hand Dominance   Dominant Hand: Right    Extremity/Trunk Assessment   Upper Extremity Assessment Upper Extremity Assessment: Defer to OT evaluation    Lower Extremity Assessment Lower Extremity Assessment: Generalized weakness;RLE deficits/detail;LLE deficits/detail RLE Deficits / Details: 4-/5 knee extension, 3/5 PF, 0/5 DF RLE Sensation: decreased light touch RLE Coordination: decreased gross motor LLE Deficits / Details: 4/5 knee extension, 3/5 PF, 0/5 DF LLE Sensation: decreased light touch LLE Coordination: decreased  gross motor    Cervical / Trunk Assessment Cervical / Trunk Assessment: Neck Surgery  Communication   Communication: No difficulties  Cognition Arousal/Alertness: Awake/alert Behavior During Therapy: WFL for tasks assessed/performed Overall Cognitive Status: Within Functional Limits for tasks assessed                                          General Comments General comments (skin integrity, edema, etc.): discussed possibility of B AFO's since pt has struggled with foot drop for prolonged time and has had numerous falls    Exercises     Assessment/Plan    PT Assessment Patient needs continued PT services  PT Problem List Decreased strength;Decreased activity tolerance;Decreased balance;Decreased mobility;Impaired sensation;Decreased knowledge of precautions       PT Treatment Interventions DME instruction;Gait training;Stair training;Functional mobility training;Therapeutic activities;Therapeutic exercise;Balance training;Neuromuscular re-education;Patient/family education    PT Goals (Current goals can be found in the Care Plan section)  Acute Rehab PT Goals Patient Stated Goal: to improve LE strength and return to independent mobility PT Goal Formulation: With patient/family Time For Goal Achievement: 05/18/21 Potential to Achieve Goals: Fair    Frequency Min 5X/week     Co-evaluation               AM-PAC PT "6 Clicks" Mobility  Outcome Measure Help needed turning from your back to your side while in a flat bed without using bedrails?: None Help  needed moving from lying on your back to sitting on the side of a flat bed without using bedrails?: A Little Help needed moving to and from a bed to a chair (including a wheelchair)?: A Little Help needed standing up from a chair using your arms (e.g., wheelchair or bedside chair)?: A Lot Help needed to walk in hospital room?: A Lot Help needed climbing 3-5 steps with a railing? : A Lot 6 Click Score:  16    End of Session Equipment Utilized During Treatment: Gait belt;Cervical collar Activity Tolerance: Patient tolerated treatment well Patient left: in chair;with call bell/phone within reach Nurse Communication: Mobility status PT Visit Diagnosis: Other abnormalities of gait and mobility (R26.89);Muscle weakness (generalized) (M62.81);Other symptoms and signs involving the nervous system (R29.898)    Time: 5102-5852 PT Time Calculation (min) (ACUTE ONLY): 35 min   Charges:   PT Evaluation $PT Re-evaluation: 1 Re-eval PT Treatments $Gait Training: 8-22 mins        Lyanne Co, PT  Acute Rehab Services  Pager [phone] Office [phone]   Lawana Chambers Tonianne Fine 05/04/2021, 12:35 PM

## 2021-05-04 NOTE — Progress Notes (Signed)
Mobility Specialist: Progress Note   05/04/21 1714  Mobility  Activity Ambulated with assistance in room  Level of Assistance Moderate assist, patient does 50-74%  Assistive Device Front wheel walker  Distance Ambulated (ft) 32 ft  Activity Response Tolerated well  $Mobility charge 1 Mobility   Pt received in bed and agreeable to ambulation. Required modA to sit EOB and minA to stand. Assisted pt in donning cervical collar. No c/o throughout ambulation. Pt back to bed with call bell and phone in reach. Pt's son present in the room.   [facility] Brynlea Spindler Mobility Specialist Mobility Specialist 5 North: [phone] Mobility Specialist 6 North: [phone]

## 2021-05-04 NOTE — Evaluation (Signed)
Occupational Therapy Evaluation Patient Details Name: Terry Gray MRN: [id number] DOB: 03-28-1933 Today's Date: 05/04/2021   History of Present Illness 88 y.o. male presents to [facility] hospital on 04/28/2021 with BLE weakness and falls. MRI demonstrates severe spinal stenosis at C5-7. Pt underwent C4-C5, C5-C6, C6-C7 ACDF.  PMH includes HTN, HLD, DOE, GERD, CVA.   Clinical Impression   Pt admitted with the above diagnosis and has the deficits outlined below. Pt would benefit from cont OT to increase independence with basic adls and adl transfers she he can d/c home with his wife and daughter. Pt currently lives with his daughter but will be moving into his own home soon.  Pt limited with safety with ambulation during adls as well as fine motor skills with BUE. Feel pt would benefit from acute inpatient rehab before returning home to assist in getting pt to mod I level of care and decrease burden of care on wife.  Will continue to see focusing on adls in standing and fine motor tasks.       Recommendations for follow up therapy are one component of a multi-disciplinary discharge planning process, led by the attending physician.  Recommendations may be updated based on patient status, additional functional criteria and insurance authorization.   Follow Up Recommendations  Acute inpatient rehab (3hours/day)    Assistance Recommended at Discharge Frequent or constant Supervision/Assistance  Patient can return home with the following A lot of help with bathing/dressing/bathroom;Assistance with cooking/housework;Assist for transportation    Functional Status Assessment  Patient has had a recent decline in their functional status and demonstrates the ability to make significant improvements in function in a reasonable and predictable amount of time.  Equipment Recommendations  Tub/shower bench    Recommendations for Other Services Rehab consult     Precautions / Restrictions  Precautions Precautions: Cervical;Fall Precaution Comments: PT verbally reviewed back precautions Required Braces or Orthoses: Cervical Brace Cervical Brace: Hard collar;At all times;Other (comment) (ok to walk to bathroom and shower w/o it.) Restrictions Weight Bearing Restrictions: No      Mobility Bed Mobility Overal bed mobility: Needs Assistance Bed Mobility: Supine to Sit Rolling: Supervision   Supine to sit: Supervision, HOB elevated     General bed mobility comments: HOB elevated    Transfers Overall transfer level: Needs assistance Equipment used: Rolling walker (2 wheels) Transfers: Sit to/from Stand Sit to Stand: Min assist           General transfer comment: Pt required cues for hand placement and min assist to power up.      Balance Overall balance assessment: Needs assistance Sitting-balance support: Feet supported Sitting balance-Leahy Scale: Good     Standing balance support: Reliant on assistive device for balance, Bilateral upper extremity supported Standing balance-Leahy Scale: Poor Standing balance comment: Pt must have walker.  Limited BLE dorsiflextion                           ADL either performed or assessed with clinical judgement   ADL Overall ADL's : Needs assistance/impaired Eating/Feeding: Set up;Sitting Eating/Feeding Details (indicate cue type and reason): assist opening packages Grooming: Wash/dry hands;Wash/dry face;Oral care;Set up;Sitting Grooming Details (indicate cue type and reason): assist with opening small containers Upper Body Bathing: Minimal assistance;Sitting   Lower Body Bathing: Moderate assistance;Sit to/from stand;Cueing for compensatory techniques Lower Body Bathing Details (indicate cue type and reason): mod assist to maintain standing while washing. Upper Body Dressing : Minimal assistance;Sitting Upper  Body Dressing Details (indicate cue type and reason): mod assist with brace and min assist with  fasteners and getting shirt over head. Lower Body Dressing: Moderate assistance;Sit to/from stand;Cueing for compensatory techniques Lower Body Dressing Details (indicate cue type and reason): assist when in standing for balance.  Assist with shoes and socks. Toilet Transfer: Minimal assistance;Stand-pivot;Rolling walker (2 wheels)   Toileting- Clothing Manipulation and Hygiene: Minimal assistance;Sit to/from stand;Cueing for compensatory techniques   Tub/ Shower Transfer: Moderate assistance;Shower seat;Grab bars;Tub transfer Tub/Shower Transfer Details (indicate cue type and reason): Pt would benefit from tub bench at home Functional mobility during ADLs: Moderate assistance;Rolling walker (2 wheels) General ADL Comments: Pt limited with any tasks than involve dynamic standing.  Pt with poor fine motor coordination and balance in standing.     Vision Baseline Vision/History: 1 Wears glasses Ability to See in Adequate Light: 0 Adequate Patient Visual Report: No change from baseline Vision Assessment?: No apparent visual deficits     Perception     Praxis      Pertinent Vitals/Pain Pain Assessment Pain Assessment: 0-10 Pain Score: 4  Pain Location: neck Pain Descriptors / Indicators: Aching, Operative site guarding Pain Intervention(s): Monitored during session, Repositioned     Hand Dominance Right   Extremity/Trunk Assessment Upper Extremity Assessment Upper Extremity Assessment: RUE deficits/detail;LUE deficits/detail RUE Deficits / Details: 4/5 grip strength and 4-/5 elbow extension RUE Coordination: decreased fine motor LUE Deficits / Details: 4/5 grip strength and 4-/5 elbow extension LUE Coordination: decreased fine motor   Lower Extremity Assessment Lower Extremity Assessment: Defer to PT evaluation   Cervical / Trunk Assessment Cervical / Trunk Assessment: Neck Surgery   Communication Communication Communication: No difficulties   Cognition  Arousal/Alertness: Awake/alert Behavior During Therapy: WFL for tasks assessed/performed Overall Cognitive Status: Within Functional Limits for tasks assessed                                       General Comments  Pt with cervical drain    Exercises     Shoulder Instructions      Home Living Family/patient expects to be discharged to:: Private residence Living Arrangements: Spouse/significant other;Children Available Help at Discharge: Family;Available 24 hours/day Type of Home: House Home Access: Stairs to enter [employer] of Steps: 3 Entrance Stairs-Rails: None Home Layout: Multi-level Alternate Level Stairs-Number of Steps: 6 Alternate Level Stairs-Rails: Right Bathroom Shower/Tub: Chief Strategy Officer: Standard     Home Equipment: Rollator (4 wheels);Rolling Walker (2 wheels);Cane - single point;BSC/3in1;Shower seat;Wheelchair - manual   Additional Comments: Pt does not use w/c inside. House is too small.      Prior Functioning/Environment Prior Level of Function : Needs assist             Mobility Comments: pt reports ambulating with a RW for household distances up until 2 weeks ago ADLs Comments: Pt was doing most adls with mod I but is not cooking, cleaning or driving.        OT Problem List: Decreased strength;Decreased range of motion;Impaired balance (sitting and/or standing);Decreased coordination;Decreased knowledge of use of DME or AE;Decreased knowledge of precautions;Impaired UE functional use;Pain      OT Treatment/Interventions: Self-care/ADL training;Therapeutic activities;Balance training    OT Goals(Current goals can be found in the care plan section) Acute Rehab OT Goals Patient Stated Goal: to be more independent and be able to walk OT Goal Formulation:  With patient Time For Goal Achievement: 05/18/21 Potential to Achieve Goals: Good ADL Goals Pt Will Perform Grooming: with  supervision;standing Pt Will Perform Lower Body Dressing: with modified independence;sit to/from stand Pt Will Transfer to Toilet: ambulating;with supervision Pt Will Perform Toileting - Clothing Manipulation and hygiene: with supervision;sit to/from stand Pt Will Perform Tub/Shower Transfer: Tub transfer;tub bench;rolling walker Additional ADL Goal #1: Pt will bathe in shower on shower seat with supervision.  OT Frequency: Min 2X/week    Co-evaluation              AM-PAC OT "6 Clicks" Daily Activity     Outcome Measure Help from another person eating meals?: A Little Help from another person taking care of personal grooming?: A Little Help from another person toileting, which includes using toliet, bedpan, or urinal?: A Little Help from another person bathing (including washing, rinsing, drying)?: A Little Help from another person to put on and taking off regular upper body clothing?: A Little Help from another person to put on and taking off regular lower body clothing?: A Lot 6 Click Score: 17   End of Session Equipment Utilized During Treatment: Rolling walker (2 wheels);Cervical collar Nurse Communication: Mobility status  Activity Tolerance: Patient tolerated treatment well Patient left: in chair;with call bell/phone within reach  OT Visit Diagnosis: Unsteadiness on feet (R26.81);Other symptoms and signs involving the nervous system (R29.898)                Time: 0867-6195 OT Time Calculation (min): 34 min Charges:  OT General Charges $OT Visit: 1 Visit OT Evaluation $OT Eval Moderate Complexity: 1 Mod OT Treatments $Self Care/Home Management : 8-22 mins  Hope Budds 05/04/2021, 9:39 AM

## 2021-05-04 NOTE — Progress Notes (Signed)
Subjective: The patient is alert and pleasant.  He looks well.  Objective: Vital signs in last 24 hours: Temp:  [97.9 F (36.6 C)-98.9 F (37.2 C)] 97.9 F (36.6 C) (02/28 0302) Pulse Rate:  [60-72] 60 (02/28 0302) Resp:  [17-20] 20 (02/28 0302) BP: (125-157)/(58-76) 126/65 (02/28 0302) SpO2:  [94 %-100 %] 94 % (02/28 0302) Weight:  [67.6 kg] 67.6 kg (02/27 0833) Estimated body mass index is 31.15 kg/m as calculated from the following:   Height as of this encounter: 4\' 10"  (1.473 m).   Weight as of this encounter: 67.6 kg.   Intake/Output from previous day: 02/27 0701 - 02/28 0700 In: 2208.5 [I.V.:2208.5] Out: 1000 [Urine:750; Drains:50; Blood:200] Intake/Output this shift: No intake/output data recorded.  Physical exam patient is alert and oriented.  He is moving all 4 extremities well.  He continues to be weak in his left hand and bilateral dorsiflexors.  His dressing is clean and dry.  There is no hematoma or shift.  Lab Results: No results for input(s): WBC, HGB, HCT, PLT in the last 72 hours. BMET No results for input(s): NA, K, CL, CO2, GLUCOSE, BUN, CREATININE, CALCIUM in the last 72 hours.  Studies/Results: DG Cervical Spine 2 or 3 views  Result Date: 05/03/2021 CLINICAL DATA:  C4-C7 ACDF EXAM: CERVICAL SPINE - 2-3 VIEW COMPARISON:  None. FINDINGS: Sequential intraoperative lateral radiographs of the cervical spine demonstrate marking instrument at the C3-C4 disc space and subsequently anterior cervical discectomy and fusion of C4 through C7. IMPRESSION: Intraoperative lateral radiographs demonstrate marking instrument at the C3-C4 disc space and subsequently anterior cervical discectomy and fusion of C4 through C7. Electronically Signed   By: 05/05/2021 M.D.   On: 05/03/2021 14:30    Assessment/Plan: Postop day #1: The patient is doing well.  Because of his difficulty with ambulation, foot drop, weakness, etc.  I think he may benefit from rehab.  I will asked the  rehab team to see him.  I have discussed this with the patient.  LOS: 1 day     05/05/2021 05/04/2021, 7:48 AM     Patient ID: Terry Gray, male   DOB: [birth year], 88 y.o.   MRN: [id number]

## 2021-05-05 MED ORDER — OXYCODONE-ACETAMINOPHEN 5-325 MG PO TABS: 1.0000 | ORAL_TABLET | ORAL | 0 refills | Status: DC | PRN

## 2021-05-05 MED ORDER — OXYCODONE-ACETAMINOPHEN 5-325 MG PO TABS: 1.0000 | ORAL_TABLET | ORAL | Status: DC | PRN

## 2021-05-05 NOTE — Progress Notes (Signed)
Pt with d/c orders home with [facility]. Assessment complete pt is stable. IV removed. Pt education and packet provided to pt and daughter all questions answered. Prescriptions sent to pharmacy.Pt and all belongings transported via wheelchair to private vehicle.  ?

## 2021-05-05 NOTE — Progress Notes (Signed)
Inpatient Rehabilitation Admissions Coordinator  ? ?Noted plans for discharge home. We will sign off. ? ?Danne Baxter, RN, MSN ?Rehab Admissions Coordinator ?[phone] ?05/05/2021 1:03 PM ? ?

## 2021-05-05 NOTE — Progress Notes (Signed)
Physical Therapy Treatment ?Patient Details ?Name: Terry Gray ?MRN: [id number] ?DOB: [birth year] ?Today's Date: 05/05/2021 ? ? ?History of Present Illness 88 y.o. male presents to [facility] hospital on 04/28/2021 with BLE weakness and falls. MRI demonstrates severe spinal stenosis at C5-7. Pt underwent C4-C5, C5-C6, C6-C7 ACDF.  PMH includes HTN, HLD, DOE, GERD, CVA. ? ?  ?PT Comments  ? ? Pt tolerates treatment well, demonstrating improved ambulation tolerance. Pt is able to progress to stair training with +1 assist with railing, and reports success with +2 assist when entering home since last discharge. Pt will benefit from continued acute PT services to aide in improving LE strength and reducing falls risk. PT recommends assistance for all out of bed mobility upon return home to improve safety.   ?Recommendations for follow up therapy are one component of a multi-disciplinary discharge planning process, led by the attending physician.  Recommendations may be updated based on patient status, additional functional criteria and insurance authorization. ? ?Follow Up Recommendations ? Home health PT ?  ?  ?Assistance Recommended at Discharge Intermittent Supervision/Assistance (assist for all out of bed mobility)  ?Patient can return home with the following A little help with walking and/or transfers;A little help with bathing/dressing/bathroom;Help with stairs or ramp for entrance;Assist for transportation;Assistance with cooking/housework ?  ?Equipment Recommendations ? Wheelchair (measurements PT);BSC/3in1  ?  ?Recommendations for Other Services   ? ? ?  ?Precautions / Restrictions Precautions ?Precautions: Cervical;Fall ?Precaution Booklet Issued: Yes (comment) ?Precaution Comments: PT verbally reviewed back precautions ?Required Braces or Orthoses: Cervical Brace ?Cervical Brace: Hard collar;Other (comment) (wear when out of bed or upright, brace not needed if going to bathroom or showering) ?Restrictions ?Weight  Bearing Restrictions: No  ?  ? ?Mobility ? Bed Mobility ?Overal bed mobility: Needs Assistance ?Bed Mobility: Rolling, Sidelying to Sit, Sit to Supine ?Rolling: Min assist ?Sidelying to sit: Min guard ?  ?Sit to supine: Supervision ?  ?  ?  ? ?Transfers ?Overall transfer level: Needs assistance ?Equipment used: Rolling walker (2 wheels) ?Transfers: Sit to/from Stand ?Sit to Stand: Supervision ?  ?  ?  ?  ?  ?  ?  ? ?Ambulation/Gait ?Ambulation/Gait assistance: Min guard ?Gait Distance (Feet): 30 Feet ?Assistive device: Rolling walker (2 wheels) ?Gait Pattern/deviations: Step-through pattern, Knee hyperextension - right, Knee hyperextension - left, Decreased dorsiflexion - right, Decreased dorsiflexion - left ?Gait velocity: reduced ?Gait velocity interpretation: <1.8 ft/sec, indicate of risk for recurrent falls ?  ?General Gait Details: pt with slowed step-through gait, consistent knee hyperextension and foot drag bilaterally ? ? ?Stairs ?Stairs: Yes ?Stairs assistance: Min guard ?Stair Management: One rail Right, Sideways, Step to pattern ?Number of Stairs: 6 ?  ? ? ?Wheelchair Mobility ?  ? ?Modified Rankin (Stroke Patients Only) ?  ? ? ?  ?Balance Overall balance assessment: Needs assistance ?Sitting-balance support: No upper extremity supported, Feet supported ?Sitting balance-Leahy Scale: Good ?  ?  ?Standing balance support: Bilateral upper extremity supported, Reliant on assistive device for balance ?Standing balance-Leahy Scale: Poor ?  ?  ?  ?  ?  ?  ?  ?  ?  ?  ?  ?  ?  ? ?  ?Cognition Arousal/Alertness: Awake/alert ?Behavior During Therapy: [facility] for tasks assessed/performed ?Overall Cognitive Status: Impaired/Different from baseline ?Area of Impairment: Memory ?  ?  ?  ?  ?  ?  ?  ?  ?  ?  ?Memory: Decreased recall of precautions ?  ?  ?  ?  ?  ?  ?  ? ?  ?  Exercises   ? ?  ?General Comments General comments (skin integrity, edema, etc.): VSS on RA ?  ?  ? ?Pertinent Vitals/Pain Pain Assessment ?Pain  Assessment: Faces ?Faces Pain Scale: Hurts little more ?Pain Location: neck ?Pain Descriptors / Indicators: Sore ?Pain Intervention(s): Monitored during session  ? ? ?Home Living   ?  ?  ?  ?  ?  ?  ?  ?  ?  ?   ?  ?Prior Function    ?  ?  ?   ? ?PT Goals (current goals can now be found in the care plan section) Acute Rehab PT Goals ?Patient Stated Goal: to improve LE strength and return to independent mobility ?Progress towards PT goals: Progressing toward goals ? ?  ?Frequency ? ? ? Min 5X/week ? ? ? ?  ?PT Plan Current plan remains appropriate  ? ? ?Co-evaluation   ?  ?  ?  ?  ? ?  ?AM-PAC PT "6 Clicks" Mobility   ?Outcome Measure ? Help needed turning from your back to your side while in a flat bed without using bedrails?: A Little ?Help needed moving from lying on your back to sitting on the side of a flat bed without using bedrails?: A Little ?Help needed moving to and from a bed to a chair (including a wheelchair)?: A Little ?Help needed standing up from a chair using your arms (e.g., wheelchair or bedside chair)?: A Little ?Help needed to walk in hospital room?: A Little ?Help needed climbing 3-5 steps with a railing? : A Little ?6 Click Score: 18 ? ?  ?End of Session Equipment Utilized During Treatment: Cervical collar ?Activity Tolerance: Patient tolerated treatment well ?Patient left: in bed;with call bell/phone within reach;with bed alarm set ?Nurse Communication: Mobility status ?PT Visit Diagnosis: Other abnormalities of gait and mobility (R26.89);Muscle weakness (generalized) (M62.81);Other symptoms and signs involving the nervous system (R29.898) ?  ? ? ?Time: 1353-1419 ?PT Time Calculation (min) (ACUTE ONLY): 26 min ? ?Charges:  $Gait Training: 8-22 mins ?$Therapeutic Activity: 8-22 mins          ?          ? ?Arlyss Gandy, PT, DPT ?Acute Rehabilitation ?Pager: [phone] ?Office [phone] ? ? ? ?Arlyss Gandy ?05/05/2021, 2:36 PM ? ?

## 2021-05-05 NOTE — TOC Transition Note (Addendum)
Transition of Care (TOC) - CM/SW Discharge Note ? ? ?Patient Details  ?Name: Terry Gray ?MRN: [id number] ?Date of Birth: 1933-12-30 ? ?Transition of Care (TOC) CM/SW Contact:  ?Beckie Busing, RN ?Phone Number:[phone] ? ?05/05/2021, 1:43 PM ? ? ?Clinical Narrative:    ?TOC consulted for patient with home health needs. CM at bedside to offer choice. Patient has no specific choice . Home Health referral has been accepted by [facility] with [facility]. Information has been added to avs.  ? ?1346 No DME needs patient was just discharged last week with wheelchair and 3in1.  ? ?Final next level of care: Home w Home Health Services ?Barriers to Discharge: No Barriers Identified ? ? ?Patient Goals and CMS Choice ?Patient states their goals for this hospitalization and ongoing recovery are:: Ready to go home ?CMS Medicare.gov Compare Post Acute Care list provided to:: Patient ?Choice offered to / list presented to : Patient ? ?Discharge Placement ?  ?           ?  ?  ?  ?  ? ?Discharge Plan and Services ?In-house Referral: NA ?Discharge Planning Services: CM Consult ?Post Acute Care Choice: Home Health          ?DME Arranged: N/A ?DME Agency: NA ?  ?  ?  ?HH Arranged: PT, OT ?HH Agency: [facility] ?Date HH Agency Contacted: 05/05/21 ?Time HH Agency Contacted: 1342 ?Representative spoke with at [facility] Agency: Kandee Keen ? ?Social Determinants of Health (SDOH) Interventions ?  ? ? ?Readmission Risk Interventions ?Readmission Risk Prevention Plan 05/05/2021  ?Post Dischage Appt Complete  ?Medication Screening Complete  ?Transportation Screening Complete  ?Some recent data might be hidden  ? ? ? ? ? ?

## 2021-05-05 NOTE — Discharge Summary (Signed)
Physician Discharge Summary  ?Patient ID: ?Terry Gray ?MRN: [id number] ?DOB/AGE: 86-Oct-1935 86 y.o. ? ?Admit date: 05/03/2021 ?Discharge date: 05/05/2021 ? ?Admission Diagnoses: Cervical spondylosis, cervicalgia, cervical myelopathy, cervical radiculopathy ? ?Discharge Diagnoses: The same ?Principal Problem: ?  Cervical spondylosis with myelopathy and radiculopathy ? ? ?Discharged Condition: good ? ?Hospital Course: I performed a C4-5, C5-6 and C6-7 anterior cervicectomy fusion and plating on the patient on 05/03/2021.  The surgery went well. ? ?The patient's postoperative course was unremarkable.  PT, OT and rehab saw him.  They recommended home PT.  I discussed discharge home versus skilled nursing facility with the patient and his family.  He requested to be discharged.  The patient, and his family, were given written and oral discharge instructions.  All their questions were answered. ? ?Consults: PT, OT, rehab ?Significant Diagnostic Studies: None ?Treatments: C4-5, C5-6 and C6-7 anterior cervicectomy fusion and plating. ?Discharge Exam: ?Blood pressure (!) 132/58, pulse 85, temperature 99.2 ?F (37.3 ?C), temperature source Oral, resp. rate 16, height 4\' 10"  (1.473 m), weight 67.6 kg, SpO2 94 %. ?The patient is alert and pleasant.  He is moving all 4 extremities well.  He remains weak predominantly in his bilateral dorsiflexors of the left hand.  His dressing is clean and dry.  There is no hematoma or shift. ? ?Disposition: Home with PT ? ?Discharge Instructions   ? ? Call MD for:  difficulty breathing, headache or visual disturbances   Complete by: As directed ?  ? Call MD for:  extreme fatigue   Complete by: As directed ?  ? Call MD for:  hives   Complete by: As directed ?  ? Call MD for:  persistant dizziness or light-headedness   Complete by: As directed ?  ? Call MD for:  persistant nausea and vomiting   Complete by: As directed ?  ? Call MD for:  redness, tenderness, or signs of infection (pain, swelling,  redness, odor or green/yellow discharge around incision site)   Complete by: As directed ?  ? Call MD for:  severe uncontrolled pain   Complete by: As directed ?  ? Call MD for:  temperature >100.4   Complete by: As directed ?  ? Diet - low sodium heart healthy   Complete by: As directed ?  ? Discharge instructions   Complete by: As directed ?  ? Call [phone] for a followup appointment. Take a stool softener while you are using pain medications.  ? Driving Restrictions   Complete by: As directed ?  ? Do not drive for 2 weeks.  ? Increase activity slowly   Complete by: As directed ?  ? Lifting restrictions   Complete by: As directed ?  ? Do not lift more than 5 pounds. No excessive bending or twisting.  ? May shower / Bathe   Complete by: As directed ?  ? Remove the dressing for 3 days after surgery.  You may shower, but leave the incision alone.  ? Remove dressing in 24 hours   Complete by: As directed ?  ? ?  ? ?Allergies as of 05/05/2021   ?No Known Allergies ?  ? ?  ?Medication List  ?  ? ?TAKE these medications   ? ?aspirin EC 81 MG tablet ?Take 81 mg by mouth daily. Swallow whole. ?  ?atorvastatin 10 MG tablet ?Commonly known as: LIPITOR ?Take 10 mg by mouth every evening. ?  ?docusate sodium 100 MG capsule ?Commonly known as: COLACE ?Take 100 mg  by mouth daily. ?  ?finasteride 5 MG tablet ?Commonly known as: PROSCAR ?Take 5 mg by mouth daily. ?  ?oxyCODONE-acetaminophen 5-325 MG tablet ?Commonly known as: PERCOCET/ROXICET ?Take 1-2 tablets by mouth every 4 (four) hours as needed for moderate pain. ?  ?ramipril 10 MG capsule ?Commonly known as: ALTACE ?Take 10 mg by mouth every morning. ?  ?Vitamin D (Ergocalciferol) 1.25 MG (50000 UNIT) Caps capsule ?Commonly known as: DRISDOL ?Take 50,000 Units by mouth 2 (two) times a week. ?  ? ?  ? ? ? ?Signed: ?Cristi Loron ?05/05/2021, 12:15 PM ? ? ? ? ?

## 2021-05-06 DIAGNOSIS — Z87891 Personal history of nicotine dependence: Secondary | ICD-10-CM | POA: Diagnosis not present

## 2021-05-06 DIAGNOSIS — E7800 Pure hypercholesterolemia, unspecified: Secondary | ICD-10-CM | POA: Diagnosis not present

## 2021-05-06 DIAGNOSIS — I10 Essential (primary) hypertension: Secondary | ICD-10-CM | POA: Diagnosis not present

## 2021-05-06 DIAGNOSIS — I4891 Unspecified atrial fibrillation: Secondary | ICD-10-CM | POA: Diagnosis not present

## 2021-05-06 DIAGNOSIS — Z981 Arthrodesis status: Secondary | ICD-10-CM | POA: Diagnosis not present

## 2021-05-06 DIAGNOSIS — M4802 Spinal stenosis, cervical region: Secondary | ICD-10-CM | POA: Diagnosis not present

## 2021-05-06 DIAGNOSIS — Z4789 Encounter for other orthopedic aftercare: Secondary | ICD-10-CM | POA: Diagnosis not present

## 2021-05-06 DIAGNOSIS — Z9181 History of falling: Secondary | ICD-10-CM | POA: Diagnosis not present

## 2021-05-06 DIAGNOSIS — M1990 Unspecified osteoarthritis, unspecified site: Secondary | ICD-10-CM | POA: Diagnosis not present

## 2021-05-06 DIAGNOSIS — M21372 Foot drop, left foot: Secondary | ICD-10-CM | POA: Diagnosis not present

## 2021-05-06 DIAGNOSIS — K219 Gastro-esophageal reflux disease without esophagitis: Secondary | ICD-10-CM | POA: Diagnosis not present

## 2021-05-06 DIAGNOSIS — G8929 Other chronic pain: Secondary | ICD-10-CM | POA: Diagnosis not present

## 2021-05-06 DIAGNOSIS — Z7982 Long term (current) use of aspirin: Secondary | ICD-10-CM | POA: Diagnosis not present

## 2021-05-06 DIAGNOSIS — M4712 Other spondylosis with myelopathy, cervical region: Secondary | ICD-10-CM | POA: Diagnosis not present

## 2021-05-06 DIAGNOSIS — M21371 Foot drop, right foot: Secondary | ICD-10-CM | POA: Diagnosis not present

## 2021-05-06 DIAGNOSIS — M4722 Other spondylosis with radiculopathy, cervical region: Secondary | ICD-10-CM | POA: Diagnosis not present

## 2021-05-20 DIAGNOSIS — Z4789 Encounter for other orthopedic aftercare: Secondary | ICD-10-CM | POA: Diagnosis not present

## 2021-05-20 DIAGNOSIS — M4722 Other spondylosis with radiculopathy, cervical region: Secondary | ICD-10-CM | POA: Diagnosis not present

## 2021-05-20 DIAGNOSIS — E7800 Pure hypercholesterolemia, unspecified: Secondary | ICD-10-CM | POA: Diagnosis not present

## 2021-05-20 DIAGNOSIS — Z87891 Personal history of nicotine dependence: Secondary | ICD-10-CM | POA: Diagnosis not present

## 2021-05-20 DIAGNOSIS — G8929 Other chronic pain: Secondary | ICD-10-CM | POA: Diagnosis not present

## 2021-05-20 DIAGNOSIS — M4712 Other spondylosis with myelopathy, cervical region: Secondary | ICD-10-CM | POA: Diagnosis not present

## 2021-05-20 DIAGNOSIS — M4802 Spinal stenosis, cervical region: Secondary | ICD-10-CM | POA: Diagnosis not present

## 2021-05-20 DIAGNOSIS — I4891 Unspecified atrial fibrillation: Secondary | ICD-10-CM | POA: Diagnosis not present

## 2021-05-20 DIAGNOSIS — Z7982 Long term (current) use of aspirin: Secondary | ICD-10-CM | POA: Diagnosis not present

## 2021-05-20 DIAGNOSIS — I10 Essential (primary) hypertension: Secondary | ICD-10-CM | POA: Diagnosis not present

## 2021-05-20 DIAGNOSIS — Z9181 History of falling: Secondary | ICD-10-CM | POA: Diagnosis not present

## 2021-05-20 DIAGNOSIS — K219 Gastro-esophageal reflux disease without esophagitis: Secondary | ICD-10-CM | POA: Diagnosis not present

## 2021-05-20 DIAGNOSIS — M21372 Foot drop, left foot: Secondary | ICD-10-CM | POA: Diagnosis not present

## 2021-05-20 DIAGNOSIS — M21371 Foot drop, right foot: Secondary | ICD-10-CM | POA: Diagnosis not present

## 2021-05-20 DIAGNOSIS — Z981 Arthrodesis status: Secondary | ICD-10-CM | POA: Diagnosis not present

## 2021-05-20 DIAGNOSIS — M1990 Unspecified osteoarthritis, unspecified site: Secondary | ICD-10-CM | POA: Diagnosis not present

## 2021-05-28 DIAGNOSIS — I10 Essential (primary) hypertension: Secondary | ICD-10-CM | POA: Diagnosis not present

## 2021-05-28 DIAGNOSIS — E782 Mixed hyperlipidemia: Secondary | ICD-10-CM | POA: Diagnosis not present

## 2021-05-29 DIAGNOSIS — N3001 Acute cystitis with hematuria: Secondary | ICD-10-CM | POA: Diagnosis not present

## 2021-05-31 DIAGNOSIS — R531 Weakness: Secondary | ICD-10-CM | POA: Diagnosis not present

## 2021-05-31 DIAGNOSIS — R269 Unspecified abnormalities of gait and mobility: Secondary | ICD-10-CM | POA: Diagnosis not present

## 2021-05-31 DIAGNOSIS — M4802 Spinal stenosis, cervical region: Secondary | ICD-10-CM | POA: Diagnosis not present

## 2021-06-01 DIAGNOSIS — M4712 Other spondylosis with myelopathy, cervical region: Secondary | ICD-10-CM | POA: Diagnosis not present

## 2021-07-01 DIAGNOSIS — R269 Unspecified abnormalities of gait and mobility: Secondary | ICD-10-CM | POA: Diagnosis not present

## 2021-07-01 DIAGNOSIS — M4802 Spinal stenosis, cervical region: Secondary | ICD-10-CM | POA: Diagnosis not present

## 2021-07-01 DIAGNOSIS — R531 Weakness: Secondary | ICD-10-CM | POA: Diagnosis not present

## 2021-07-13 DIAGNOSIS — M1990 Unspecified osteoarthritis, unspecified site: Secondary | ICD-10-CM | POA: Diagnosis not present

## 2021-07-13 DIAGNOSIS — E782 Mixed hyperlipidemia: Secondary | ICD-10-CM | POA: Diagnosis not present

## 2021-07-13 DIAGNOSIS — N400 Enlarged prostate without lower urinary tract symptoms: Secondary | ICD-10-CM | POA: Diagnosis not present

## 2021-07-13 DIAGNOSIS — I10 Essential (primary) hypertension: Secondary | ICD-10-CM | POA: Diagnosis not present

## 2021-07-31 DIAGNOSIS — R269 Unspecified abnormalities of gait and mobility: Secondary | ICD-10-CM | POA: Diagnosis not present

## 2021-07-31 DIAGNOSIS — R531 Weakness: Secondary | ICD-10-CM | POA: Diagnosis not present

## 2021-07-31 DIAGNOSIS — M4802 Spinal stenosis, cervical region: Secondary | ICD-10-CM | POA: Diagnosis not present

## 2021-08-23 DIAGNOSIS — E782 Mixed hyperlipidemia: Secondary | ICD-10-CM | POA: Diagnosis not present

## 2021-08-23 DIAGNOSIS — I10 Essential (primary) hypertension: Secondary | ICD-10-CM | POA: Diagnosis not present

## 2021-08-31 DIAGNOSIS — R531 Weakness: Secondary | ICD-10-CM | POA: Diagnosis not present

## 2021-08-31 DIAGNOSIS — Z6829 Body mass index (BMI) 29.0-29.9, adult: Secondary | ICD-10-CM | POA: Diagnosis not present

## 2021-08-31 DIAGNOSIS — R269 Unspecified abnormalities of gait and mobility: Secondary | ICD-10-CM | POA: Diagnosis not present

## 2021-08-31 DIAGNOSIS — M21371 Foot drop, right foot: Secondary | ICD-10-CM | POA: Diagnosis not present

## 2021-08-31 DIAGNOSIS — M21372 Foot drop, left foot: Secondary | ICD-10-CM | POA: Diagnosis not present

## 2021-08-31 DIAGNOSIS — M4712 Other spondylosis with myelopathy, cervical region: Secondary | ICD-10-CM | POA: Diagnosis not present

## 2021-08-31 DIAGNOSIS — M4802 Spinal stenosis, cervical region: Secondary | ICD-10-CM | POA: Diagnosis not present

## 2021-09-03 DIAGNOSIS — G959 Disease of spinal cord, unspecified: Secondary | ICD-10-CM | POA: Diagnosis not present

## 2021-09-03 DIAGNOSIS — E782 Mixed hyperlipidemia: Secondary | ICD-10-CM | POA: Diagnosis not present

## 2021-09-03 DIAGNOSIS — M519 Unspecified thoracic, thoracolumbar and lumbosacral intervertebral disc disorder: Secondary | ICD-10-CM | POA: Diagnosis not present

## 2021-09-03 DIAGNOSIS — G459 Transient cerebral ischemic attack, unspecified: Secondary | ICD-10-CM | POA: Diagnosis not present

## 2021-09-03 DIAGNOSIS — D696 Thrombocytopenia, unspecified: Secondary | ICD-10-CM | POA: Diagnosis not present

## 2021-09-03 DIAGNOSIS — Z981 Arthrodesis status: Secondary | ICD-10-CM | POA: Diagnosis not present

## 2021-09-03 DIAGNOSIS — Z23 Encounter for immunization: Secondary | ICD-10-CM | POA: Diagnosis not present

## 2021-09-03 DIAGNOSIS — R269 Unspecified abnormalities of gait and mobility: Secondary | ICD-10-CM | POA: Diagnosis not present

## 2021-09-03 DIAGNOSIS — I10 Essential (primary) hypertension: Secondary | ICD-10-CM | POA: Diagnosis not present

## 2021-09-10 DIAGNOSIS — I10 Essential (primary) hypertension: Secondary | ICD-10-CM | POA: Diagnosis not present

## 2021-09-10 DIAGNOSIS — E782 Mixed hyperlipidemia: Secondary | ICD-10-CM | POA: Diagnosis not present

## 2021-09-30 DIAGNOSIS — R531 Weakness: Secondary | ICD-10-CM | POA: Diagnosis not present

## 2021-09-30 DIAGNOSIS — R269 Unspecified abnormalities of gait and mobility: Secondary | ICD-10-CM | POA: Diagnosis not present

## 2021-09-30 DIAGNOSIS — M4802 Spinal stenosis, cervical region: Secondary | ICD-10-CM | POA: Diagnosis not present

## 2021-10-04 DIAGNOSIS — M21372 Foot drop, left foot: Secondary | ICD-10-CM | POA: Diagnosis not present

## 2021-10-04 DIAGNOSIS — E785 Hyperlipidemia, unspecified: Secondary | ICD-10-CM | POA: Diagnosis not present

## 2021-10-04 DIAGNOSIS — J309 Allergic rhinitis, unspecified: Secondary | ICD-10-CM | POA: Diagnosis not present

## 2021-10-04 DIAGNOSIS — I10 Essential (primary) hypertension: Secondary | ICD-10-CM | POA: Diagnosis not present

## 2021-10-04 DIAGNOSIS — E663 Overweight: Secondary | ICD-10-CM | POA: Diagnosis not present

## 2021-10-04 DIAGNOSIS — Z87891 Personal history of nicotine dependence: Secondary | ICD-10-CM | POA: Diagnosis not present

## 2021-10-04 DIAGNOSIS — K5900 Constipation, unspecified: Secondary | ICD-10-CM | POA: Diagnosis not present

## 2021-10-04 DIAGNOSIS — M1990 Unspecified osteoarthritis, unspecified site: Secondary | ICD-10-CM | POA: Diagnosis not present

## 2021-10-04 DIAGNOSIS — M21371 Foot drop, right foot: Secondary | ICD-10-CM | POA: Diagnosis not present

## 2021-10-04 DIAGNOSIS — N400 Enlarged prostate without lower urinary tract symptoms: Secondary | ICD-10-CM | POA: Diagnosis not present

## 2021-10-04 DIAGNOSIS — Z7982 Long term (current) use of aspirin: Secondary | ICD-10-CM | POA: Diagnosis not present

## 2021-10-04 DIAGNOSIS — N529 Male erectile dysfunction, unspecified: Secondary | ICD-10-CM | POA: Diagnosis not present

## 2021-10-11 DIAGNOSIS — I10 Essential (primary) hypertension: Secondary | ICD-10-CM | POA: Diagnosis not present

## 2021-10-11 DIAGNOSIS — E782 Mixed hyperlipidemia: Secondary | ICD-10-CM | POA: Diagnosis not present

## 2021-10-11 DIAGNOSIS — N400 Enlarged prostate without lower urinary tract symptoms: Secondary | ICD-10-CM | POA: Diagnosis not present

## 2021-10-11 DIAGNOSIS — M1990 Unspecified osteoarthritis, unspecified site: Secondary | ICD-10-CM | POA: Diagnosis not present

## 2021-10-31 DIAGNOSIS — R531 Weakness: Secondary | ICD-10-CM | POA: Diagnosis not present

## 2021-10-31 DIAGNOSIS — R269 Unspecified abnormalities of gait and mobility: Secondary | ICD-10-CM | POA: Diagnosis not present

## 2021-10-31 DIAGNOSIS — M4802 Spinal stenosis, cervical region: Secondary | ICD-10-CM | POA: Diagnosis not present

## 2021-11-16 DIAGNOSIS — E782 Mixed hyperlipidemia: Secondary | ICD-10-CM | POA: Diagnosis not present

## 2021-11-16 DIAGNOSIS — M1990 Unspecified osteoarthritis, unspecified site: Secondary | ICD-10-CM | POA: Diagnosis not present

## 2021-11-16 DIAGNOSIS — I10 Essential (primary) hypertension: Secondary | ICD-10-CM | POA: Diagnosis not present

## 2021-11-16 DIAGNOSIS — N400 Enlarged prostate without lower urinary tract symptoms: Secondary | ICD-10-CM | POA: Diagnosis not present

## 2021-12-01 DIAGNOSIS — R269 Unspecified abnormalities of gait and mobility: Secondary | ICD-10-CM | POA: Diagnosis not present

## 2021-12-01 DIAGNOSIS — R531 Weakness: Secondary | ICD-10-CM | POA: Diagnosis not present

## 2021-12-01 DIAGNOSIS — M4802 Spinal stenosis, cervical region: Secondary | ICD-10-CM | POA: Diagnosis not present

## 2021-12-09 DIAGNOSIS — E782 Mixed hyperlipidemia: Secondary | ICD-10-CM | POA: Diagnosis not present

## 2021-12-09 DIAGNOSIS — M1990 Unspecified osteoarthritis, unspecified site: Secondary | ICD-10-CM | POA: Diagnosis not present

## 2021-12-09 DIAGNOSIS — I10 Essential (primary) hypertension: Secondary | ICD-10-CM | POA: Diagnosis not present

## 2021-12-09 DIAGNOSIS — N400 Enlarged prostate without lower urinary tract symptoms: Secondary | ICD-10-CM | POA: Diagnosis not present

## 2021-12-31 DIAGNOSIS — R269 Unspecified abnormalities of gait and mobility: Secondary | ICD-10-CM | POA: Diagnosis not present

## 2021-12-31 DIAGNOSIS — M4802 Spinal stenosis, cervical region: Secondary | ICD-10-CM | POA: Diagnosis not present

## 2021-12-31 DIAGNOSIS — R531 Weakness: Secondary | ICD-10-CM | POA: Diagnosis not present

## 2022-01-10 DIAGNOSIS — E782 Mixed hyperlipidemia: Secondary | ICD-10-CM | POA: Diagnosis not present

## 2022-01-10 DIAGNOSIS — M1990 Unspecified osteoarthritis, unspecified site: Secondary | ICD-10-CM | POA: Diagnosis not present

## 2022-01-10 DIAGNOSIS — I10 Essential (primary) hypertension: Secondary | ICD-10-CM | POA: Diagnosis not present

## 2022-01-10 DIAGNOSIS — N400 Enlarged prostate without lower urinary tract symptoms: Secondary | ICD-10-CM | POA: Diagnosis not present

## 2022-01-31 DIAGNOSIS — R531 Weakness: Secondary | ICD-10-CM | POA: Diagnosis not present

## 2022-01-31 DIAGNOSIS — R269 Unspecified abnormalities of gait and mobility: Secondary | ICD-10-CM | POA: Diagnosis not present

## 2022-01-31 DIAGNOSIS — M4802 Spinal stenosis, cervical region: Secondary | ICD-10-CM | POA: Diagnosis not present

## 2022-02-07 DIAGNOSIS — Z981 Arthrodesis status: Secondary | ICD-10-CM | POA: Diagnosis not present

## 2022-02-07 DIAGNOSIS — M1990 Unspecified osteoarthritis, unspecified site: Secondary | ICD-10-CM | POA: Diagnosis not present

## 2022-02-07 DIAGNOSIS — I10 Essential (primary) hypertension: Secondary | ICD-10-CM | POA: Diagnosis not present

## 2022-02-07 DIAGNOSIS — E782 Mixed hyperlipidemia: Secondary | ICD-10-CM | POA: Diagnosis not present

## 2022-02-07 DIAGNOSIS — Z1331 Encounter for screening for depression: Secondary | ICD-10-CM | POA: Diagnosis not present

## 2022-02-07 DIAGNOSIS — M519 Unspecified thoracic, thoracolumbar and lumbosacral intervertebral disc disorder: Secondary | ICD-10-CM | POA: Diagnosis not present

## 2022-02-07 DIAGNOSIS — Z0000 Encounter for general adult medical examination without abnormal findings: Secondary | ICD-10-CM | POA: Diagnosis not present

## 2022-02-07 DIAGNOSIS — R269 Unspecified abnormalities of gait and mobility: Secondary | ICD-10-CM | POA: Diagnosis not present

## 2022-02-07 DIAGNOSIS — R0981 Nasal congestion: Secondary | ICD-10-CM | POA: Diagnosis not present

## 2022-02-07 DIAGNOSIS — J309 Allergic rhinitis, unspecified: Secondary | ICD-10-CM | POA: Diagnosis not present

## 2022-02-07 DIAGNOSIS — D696 Thrombocytopenia, unspecified: Secondary | ICD-10-CM | POA: Diagnosis not present

## 2022-02-07 DIAGNOSIS — G459 Transient cerebral ischemic attack, unspecified: Secondary | ICD-10-CM | POA: Diagnosis not present

## 2022-02-07 DIAGNOSIS — N400 Enlarged prostate without lower urinary tract symptoms: Secondary | ICD-10-CM | POA: Diagnosis not present

## 2022-03-02 DIAGNOSIS — R531 Weakness: Secondary | ICD-10-CM | POA: Diagnosis not present

## 2022-03-02 DIAGNOSIS — M4802 Spinal stenosis, cervical region: Secondary | ICD-10-CM | POA: Diagnosis not present

## 2022-03-02 DIAGNOSIS — R269 Unspecified abnormalities of gait and mobility: Secondary | ICD-10-CM | POA: Diagnosis not present

## 2022-03-03 DIAGNOSIS — H43393 Other vitreous opacities, bilateral: Secondary | ICD-10-CM | POA: Diagnosis not present

## 2022-03-03 DIAGNOSIS — H40033 Anatomical narrow angle, bilateral: Secondary | ICD-10-CM | POA: Diagnosis not present

## 2022-03-08 DIAGNOSIS — M21371 Foot drop, right foot: Secondary | ICD-10-CM | POA: Diagnosis not present

## 2022-03-08 DIAGNOSIS — M4712 Other spondylosis with myelopathy, cervical region: Secondary | ICD-10-CM | POA: Diagnosis not present

## 2022-03-08 DIAGNOSIS — M4316 Spondylolisthesis, lumbar region: Secondary | ICD-10-CM | POA: Diagnosis not present

## 2022-03-10 DIAGNOSIS — M1990 Unspecified osteoarthritis, unspecified site: Secondary | ICD-10-CM | POA: Diagnosis not present

## 2022-03-10 DIAGNOSIS — E782 Mixed hyperlipidemia: Secondary | ICD-10-CM | POA: Diagnosis not present

## 2022-03-10 DIAGNOSIS — G459 Transient cerebral ischemic attack, unspecified: Secondary | ICD-10-CM | POA: Diagnosis not present

## 2022-03-10 DIAGNOSIS — N400 Enlarged prostate without lower urinary tract symptoms: Secondary | ICD-10-CM | POA: Diagnosis not present

## 2022-03-10 DIAGNOSIS — I10 Essential (primary) hypertension: Secondary | ICD-10-CM | POA: Diagnosis not present

## 2022-04-02 DIAGNOSIS — R531 Weakness: Secondary | ICD-10-CM | POA: Diagnosis not present

## 2022-04-02 DIAGNOSIS — M4802 Spinal stenosis, cervical region: Secondary | ICD-10-CM | POA: Diagnosis not present

## 2022-04-02 DIAGNOSIS — R269 Unspecified abnormalities of gait and mobility: Secondary | ICD-10-CM | POA: Diagnosis not present

## 2022-04-11 DIAGNOSIS — E782 Mixed hyperlipidemia: Secondary | ICD-10-CM | POA: Diagnosis not present

## 2022-04-11 DIAGNOSIS — N400 Enlarged prostate without lower urinary tract symptoms: Secondary | ICD-10-CM | POA: Diagnosis not present

## 2022-04-11 DIAGNOSIS — G459 Transient cerebral ischemic attack, unspecified: Secondary | ICD-10-CM | POA: Diagnosis not present

## 2022-04-11 DIAGNOSIS — I10 Essential (primary) hypertension: Secondary | ICD-10-CM | POA: Diagnosis not present

## 2022-04-11 DIAGNOSIS — M1990 Unspecified osteoarthritis, unspecified site: Secondary | ICD-10-CM | POA: Diagnosis not present

## 2022-04-13 DIAGNOSIS — M1990 Unspecified osteoarthritis, unspecified site: Secondary | ICD-10-CM | POA: Diagnosis not present

## 2022-04-13 DIAGNOSIS — J309 Allergic rhinitis, unspecified: Secondary | ICD-10-CM | POA: Diagnosis not present

## 2022-04-13 DIAGNOSIS — I10 Essential (primary) hypertension: Secondary | ICD-10-CM | POA: Diagnosis not present

## 2022-04-13 DIAGNOSIS — E261 Secondary hyperaldosteronism: Secondary | ICD-10-CM | POA: Diagnosis not present

## 2022-04-13 DIAGNOSIS — E663 Overweight: Secondary | ICD-10-CM | POA: Diagnosis not present

## 2022-04-13 DIAGNOSIS — E785 Hyperlipidemia, unspecified: Secondary | ICD-10-CM | POA: Diagnosis not present

## 2022-04-13 DIAGNOSIS — E559 Vitamin D deficiency, unspecified: Secondary | ICD-10-CM | POA: Diagnosis not present

## 2022-04-13 DIAGNOSIS — G8929 Other chronic pain: Secondary | ICD-10-CM | POA: Diagnosis not present

## 2022-04-13 DIAGNOSIS — N400 Enlarged prostate without lower urinary tract symptoms: Secondary | ICD-10-CM | POA: Diagnosis not present

## 2022-04-13 DIAGNOSIS — K219 Gastro-esophageal reflux disease without esophagitis: Secondary | ICD-10-CM | POA: Diagnosis not present

## 2022-04-13 DIAGNOSIS — K5900 Constipation, unspecified: Secondary | ICD-10-CM | POA: Diagnosis not present

## 2022-04-13 DIAGNOSIS — D696 Thrombocytopenia, unspecified: Secondary | ICD-10-CM | POA: Diagnosis not present

## 2022-05-03 DIAGNOSIS — R531 Weakness: Secondary | ICD-10-CM | POA: Diagnosis not present

## 2022-05-03 DIAGNOSIS — M4802 Spinal stenosis, cervical region: Secondary | ICD-10-CM | POA: Diagnosis not present

## 2022-05-03 DIAGNOSIS — R269 Unspecified abnormalities of gait and mobility: Secondary | ICD-10-CM | POA: Diagnosis not present

## 2022-06-08 DIAGNOSIS — E782 Mixed hyperlipidemia: Secondary | ICD-10-CM | POA: Diagnosis not present

## 2022-06-08 DIAGNOSIS — M1990 Unspecified osteoarthritis, unspecified site: Secondary | ICD-10-CM | POA: Diagnosis not present

## 2022-06-08 DIAGNOSIS — N400 Enlarged prostate without lower urinary tract symptoms: Secondary | ICD-10-CM | POA: Diagnosis not present

## 2022-06-08 DIAGNOSIS — I10 Essential (primary) hypertension: Secondary | ICD-10-CM | POA: Diagnosis not present

## 2022-08-11 DIAGNOSIS — M519 Unspecified thoracic, thoracolumbar and lumbosacral intervertebral disc disorder: Secondary | ICD-10-CM | POA: Diagnosis not present

## 2022-08-11 DIAGNOSIS — Z9989 Dependence on other enabling machines and devices: Secondary | ICD-10-CM | POA: Diagnosis not present

## 2022-08-11 DIAGNOSIS — N400 Enlarged prostate without lower urinary tract symptoms: Secondary | ICD-10-CM | POA: Diagnosis not present

## 2022-08-11 DIAGNOSIS — I10 Essential (primary) hypertension: Secondary | ICD-10-CM | POA: Diagnosis not present

## 2022-08-11 DIAGNOSIS — E782 Mixed hyperlipidemia: Secondary | ICD-10-CM | POA: Diagnosis not present

## 2022-08-11 DIAGNOSIS — G959 Disease of spinal cord, unspecified: Secondary | ICD-10-CM | POA: Diagnosis not present

## 2022-08-11 DIAGNOSIS — D696 Thrombocytopenia, unspecified: Secondary | ICD-10-CM | POA: Diagnosis not present

## 2022-08-11 DIAGNOSIS — K5900 Constipation, unspecified: Secondary | ICD-10-CM | POA: Diagnosis not present

## 2022-09-01 DIAGNOSIS — E782 Mixed hyperlipidemia: Secondary | ICD-10-CM | POA: Diagnosis not present

## 2022-09-01 DIAGNOSIS — I10 Essential (primary) hypertension: Secondary | ICD-10-CM | POA: Diagnosis not present

## 2022-09-01 DIAGNOSIS — M1990 Unspecified osteoarthritis, unspecified site: Secondary | ICD-10-CM | POA: Diagnosis not present

## 2022-09-12 DIAGNOSIS — M542 Cervicalgia: Secondary | ICD-10-CM | POA: Diagnosis not present

## 2023-02-09 DIAGNOSIS — I10 Essential (primary) hypertension: Secondary | ICD-10-CM | POA: Diagnosis not present

## 2023-02-09 DIAGNOSIS — E782 Mixed hyperlipidemia: Secondary | ICD-10-CM | POA: Diagnosis not present

## 2023-02-09 DIAGNOSIS — Z9989 Dependence on other enabling machines and devices: Secondary | ICD-10-CM | POA: Diagnosis not present

## 2023-02-09 DIAGNOSIS — R269 Unspecified abnormalities of gait and mobility: Secondary | ICD-10-CM | POA: Diagnosis not present

## 2023-02-09 DIAGNOSIS — D696 Thrombocytopenia, unspecified: Secondary | ICD-10-CM | POA: Diagnosis not present

## 2023-02-09 DIAGNOSIS — M519 Unspecified thoracic, thoracolumbar and lumbosacral intervertebral disc disorder: Secondary | ICD-10-CM | POA: Diagnosis not present

## 2023-02-09 DIAGNOSIS — G959 Disease of spinal cord, unspecified: Secondary | ICD-10-CM | POA: Diagnosis not present

## 2023-02-09 DIAGNOSIS — Z981 Arthrodesis status: Secondary | ICD-10-CM | POA: Diagnosis not present

## 2023-02-09 DIAGNOSIS — Z0000 Encounter for general adult medical examination without abnormal findings: Secondary | ICD-10-CM | POA: Diagnosis not present

## 2023-02-09 DIAGNOSIS — Z1331 Encounter for screening for depression: Secondary | ICD-10-CM | POA: Diagnosis not present

## 2023-02-09 DIAGNOSIS — H3530 Unspecified macular degeneration: Secondary | ICD-10-CM | POA: Diagnosis not present

## 2023-02-09 DIAGNOSIS — G459 Transient cerebral ischemic attack, unspecified: Secondary | ICD-10-CM | POA: Diagnosis not present

## 2023-08-14 DIAGNOSIS — G459 Transient cerebral ischemic attack, unspecified: Secondary | ICD-10-CM | POA: Diagnosis not present

## 2023-08-14 DIAGNOSIS — D696 Thrombocytopenia, unspecified: Secondary | ICD-10-CM | POA: Diagnosis not present

## 2023-08-14 DIAGNOSIS — Z981 Arthrodesis status: Secondary | ICD-10-CM | POA: Diagnosis not present

## 2023-08-14 DIAGNOSIS — M519 Unspecified thoracic, thoracolumbar and lumbosacral intervertebral disc disorder: Secondary | ICD-10-CM | POA: Diagnosis not present

## 2023-08-14 DIAGNOSIS — I10 Essential (primary) hypertension: Secondary | ICD-10-CM | POA: Diagnosis not present

## 2023-08-14 DIAGNOSIS — R269 Unspecified abnormalities of gait and mobility: Secondary | ICD-10-CM | POA: Diagnosis not present

## 2023-08-14 DIAGNOSIS — R634 Abnormal weight loss: Secondary | ICD-10-CM | POA: Diagnosis not present

## 2023-08-14 DIAGNOSIS — E782 Mixed hyperlipidemia: Secondary | ICD-10-CM | POA: Diagnosis not present

## 2023-08-14 DIAGNOSIS — H3530 Unspecified macular degeneration: Secondary | ICD-10-CM | POA: Diagnosis not present

## 2023-09-06 DIAGNOSIS — G459 Transient cerebral ischemic attack, unspecified: Secondary | ICD-10-CM | POA: Diagnosis not present

## 2023-09-06 DIAGNOSIS — I10 Essential (primary) hypertension: Secondary | ICD-10-CM | POA: Diagnosis not present

## 2023-10-05 DIAGNOSIS — M1990 Unspecified osteoarthritis, unspecified site: Secondary | ICD-10-CM | POA: Diagnosis not present

## 2023-10-05 DIAGNOSIS — I10 Essential (primary) hypertension: Secondary | ICD-10-CM | POA: Diagnosis not present

## 2023-10-05 DIAGNOSIS — E782 Mixed hyperlipidemia: Secondary | ICD-10-CM | POA: Diagnosis not present

## 2023-10-05 DIAGNOSIS — N400 Enlarged prostate without lower urinary tract symptoms: Secondary | ICD-10-CM | POA: Diagnosis not present

## 2023-10-05 DIAGNOSIS — G459 Transient cerebral ischemic attack, unspecified: Secondary | ICD-10-CM | POA: Diagnosis not present

## 2023-11-04 DIAGNOSIS — I10 Essential (primary) hypertension: Secondary | ICD-10-CM | POA: Diagnosis not present

## 2023-11-04 DIAGNOSIS — G459 Transient cerebral ischemic attack, unspecified: Secondary | ICD-10-CM | POA: Diagnosis not present

## 2023-11-05 DIAGNOSIS — M1990 Unspecified osteoarthritis, unspecified site: Secondary | ICD-10-CM | POA: Diagnosis not present

## 2023-11-05 DIAGNOSIS — N400 Enlarged prostate without lower urinary tract symptoms: Secondary | ICD-10-CM | POA: Diagnosis not present

## 2023-11-05 DIAGNOSIS — E782 Mixed hyperlipidemia: Secondary | ICD-10-CM | POA: Diagnosis not present

## 2023-11-05 DIAGNOSIS — I10 Essential (primary) hypertension: Secondary | ICD-10-CM | POA: Diagnosis not present

## 2023-11-05 DIAGNOSIS — G459 Transient cerebral ischemic attack, unspecified: Secondary | ICD-10-CM | POA: Diagnosis not present

## 2023-11-12 ENCOUNTER — Other Ambulatory Visit: Payer: Self-pay

## 2023-11-12 ENCOUNTER — Emergency Department (HOSPITAL_COMMUNITY)

## 2023-11-12 ENCOUNTER — Encounter (HOSPITAL_COMMUNITY): Payer: Self-pay

## 2023-11-12 ENCOUNTER — Observation Stay (HOSPITAL_COMMUNITY)
Admission: EM | Admit: 2023-11-12 | Discharge: 2023-11-14 | Disposition: A | Discharge status: Home Health | Attending: Hospitalist | Admitting: Hospitalist

## 2023-11-12 DIAGNOSIS — I6389 Other cerebral infarction: Secondary | ICD-10-CM | POA: Diagnosis not present

## 2023-11-12 DIAGNOSIS — Z87891 Personal history of nicotine dependence: Secondary | ICD-10-CM | POA: Insufficient documentation

## 2023-11-12 DIAGNOSIS — M4712 Other spondylosis with myelopathy, cervical region: Secondary | ICD-10-CM | POA: Diagnosis not present

## 2023-11-12 DIAGNOSIS — Z79899 Other long term (current) drug therapy: Secondary | ICD-10-CM | POA: Insufficient documentation

## 2023-11-12 DIAGNOSIS — M4802 Spinal stenosis, cervical region: Secondary | ICD-10-CM | POA: Diagnosis not present

## 2023-11-12 DIAGNOSIS — Z7982 Long term (current) use of aspirin: Secondary | ICD-10-CM | POA: Diagnosis not present

## 2023-11-12 DIAGNOSIS — E785 Hyperlipidemia, unspecified: Secondary | ICD-10-CM | POA: Diagnosis not present

## 2023-11-12 DIAGNOSIS — N400 Enlarged prostate without lower urinary tract symptoms: Secondary | ICD-10-CM | POA: Diagnosis not present

## 2023-11-12 DIAGNOSIS — R7989 Other specified abnormal findings of blood chemistry: Secondary | ICD-10-CM | POA: Insufficient documentation

## 2023-11-12 DIAGNOSIS — I6523 Occlusion and stenosis of bilateral carotid arteries: Secondary | ICD-10-CM | POA: Insufficient documentation

## 2023-11-12 DIAGNOSIS — R29703 NIHSS score 3: Secondary | ICD-10-CM | POA: Diagnosis not present

## 2023-11-12 DIAGNOSIS — R079 Chest pain, unspecified: Secondary | ICD-10-CM | POA: Diagnosis not present

## 2023-11-12 DIAGNOSIS — R29818 Other symptoms and signs involving the nervous system: Secondary | ICD-10-CM | POA: Diagnosis not present

## 2023-11-12 DIAGNOSIS — I6782 Cerebral ischemia: Secondary | ICD-10-CM | POA: Diagnosis not present

## 2023-11-12 DIAGNOSIS — I639 Cerebral infarction, unspecified: Secondary | ICD-10-CM | POA: Diagnosis not present

## 2023-11-12 DIAGNOSIS — I499 Cardiac arrhythmia, unspecified: Secondary | ICD-10-CM | POA: Diagnosis not present

## 2023-11-12 DIAGNOSIS — R112 Nausea with vomiting, unspecified: Secondary | ICD-10-CM

## 2023-11-12 DIAGNOSIS — G459 Transient cerebral ischemic attack, unspecified: Secondary | ICD-10-CM | POA: Diagnosis present

## 2023-11-12 DIAGNOSIS — I700 Atherosclerosis of aorta: Secondary | ICD-10-CM | POA: Diagnosis not present

## 2023-11-12 DIAGNOSIS — M4722 Other spondylosis with radiculopathy, cervical region: Secondary | ICD-10-CM | POA: Diagnosis not present

## 2023-11-12 DIAGNOSIS — R42 Dizziness and giddiness: Principal | ICD-10-CM

## 2023-11-12 DIAGNOSIS — I10 Essential (primary) hypertension: Secondary | ICD-10-CM | POA: Diagnosis not present

## 2023-11-12 DIAGNOSIS — I63432 Cerebral infarction due to embolism of left posterior cerebral artery: Secondary | ICD-10-CM

## 2023-11-12 DIAGNOSIS — R0602 Shortness of breath: Secondary | ICD-10-CM | POA: Insufficient documentation

## 2023-11-12 DIAGNOSIS — I491 Atrial premature depolarization: Secondary | ICD-10-CM | POA: Diagnosis not present

## 2023-11-12 DIAGNOSIS — Z8679 Personal history of other diseases of the circulatory system: Secondary | ICD-10-CM | POA: Insufficient documentation

## 2023-11-12 DIAGNOSIS — G4489 Other headache syndrome: Secondary | ICD-10-CM | POA: Diagnosis not present

## 2023-11-12 DIAGNOSIS — I6501 Occlusion and stenosis of right vertebral artery: Secondary | ICD-10-CM | POA: Diagnosis not present

## 2023-11-12 LAB — COMPREHENSIVE METABOLIC PANEL WITH GFR
ALT: 14 U/L (ref 0–44)
AST: 23 U/L (ref 15–41)
Albumin: 3.2 g/dL — ABNORMAL LOW (ref 3.5–5.0)
Alkaline Phosphatase: 45 U/L (ref 38–126)
Anion gap: 8 (ref 5–15)
BUN: 21 mg/dL (ref 8–23)
CO2: 24 mmol/L (ref 22–32)
Calcium: 8.5 mg/dL — ABNORMAL LOW (ref 8.9–10.3)
Chloride: 108 mmol/L (ref 98–111)
Creatinine, Ser: 0.97 mg/dL (ref 0.61–1.24)
GFR, Estimated: >60 mL/min (ref >60)
Glucose, Bld: 108 mg/dL — ABNORMAL HIGH (ref 70–99)
Potassium: 4.1 mmol/L (ref 3.5–5.1)
Sodium: 140 mmol/L (ref 135–145)
Total Bilirubin: 0.7 mg/dL (ref 0.0–1.2)
Total Protein: 6.2 g/dL — ABNORMAL LOW (ref 6.5–8.1)

## 2023-11-12 LAB — CBC WITH DIFFERENTIAL/PLATELET
Abs Immature Granulocytes: 0.03 K/uL (ref 0.00–0.07)
Basophils Absolute: 0 K/uL (ref 0.0–0.1)
Basophils Relative: 0 %
Eosinophils Absolute: 0.3 K/uL (ref 0.0–0.5)
Eosinophils Relative: 3 %
HCT: 40.6 % (ref 39.0–52.0)
Hemoglobin: 13 g/dL (ref 13.0–17.0)
Immature Granulocytes: 0 %
Lymphocytes Relative: 20 %
Lymphs Abs: 1.9 K/uL (ref 0.7–4.0)
MCH: 28 pg (ref 26.0–34.0)
MCHC: 32 g/dL (ref 30.0–36.0)
MCV: 87.3 fL (ref 80.0–100.0)
Monocytes Absolute: 0.5 K/uL (ref 0.1–1.0)
Monocytes Relative: 5 %
Neutro Abs: 6.6 K/uL (ref 1.7–7.7)
Neutrophils Relative %: 72 %
Platelets: 107 K/uL — ABNORMAL LOW (ref 150–400)
RBC: 4.65 MIL/uL (ref 4.22–5.81)
RDW: 14 % (ref 11.5–15.5)
WBC: 9.3 K/uL (ref 4.0–10.5)
nRBC: 0 % (ref 0.0–0.2)

## 2023-11-12 LAB — RESP PANEL BY RT-PCR (RSV, FLU A&B, COVID)  RVPGX2
Influenza A by PCR: NEGATIVE
Influenza B by PCR: NEGATIVE
Resp Syncytial Virus by PCR: NEGATIVE
SARS Coronavirus 2 by RT PCR: NEGATIVE

## 2023-11-12 LAB — URINALYSIS, ROUTINE W REFLEX MICROSCOPIC
Bilirubin Urine: NEGATIVE
Glucose, UA: NEGATIVE mg/dL
Hgb urine dipstick: NEGATIVE
Ketones, ur: NEGATIVE mg/dL
Leukocytes,Ua: NEGATIVE
Nitrite: NEGATIVE
Protein, ur: NEGATIVE mg/dL
Specific Gravity, Urine: 1.018 (ref 1.005–1.030)
pH: 6 (ref 5.0–8.0)

## 2023-11-12 LAB — LIPASE, BLOOD: Lipase: 51 U/L (ref 11–51)

## 2023-11-12 LAB — TROPONIN I (HIGH SENSITIVITY)
Troponin I (High Sensitivity): 28 ng/L — ABNORMAL HIGH (ref <18)
Troponin I (High Sensitivity): 28 ng/L — ABNORMAL HIGH (ref <18)

## 2023-11-12 MED ORDER — MECLIZINE HCL 25 MG PO TABS
25.0000 mg | ORAL_TABLET | Freq: Once | ORAL | Status: AC
  Administered 2023-11-12: 25 mg via ORAL
  Filled 2023-11-12: qty 1

## 2023-11-12 MED ORDER — IOHEXOL 350 MG/ML SOLN
75.0000 mL | Freq: Once | INTRAVENOUS | Status: AC | PRN
  Administered 2023-11-12: 75 mL via INTRAVENOUS

## 2023-11-12 MED ORDER — MIDAZOLAM HCL 2 MG/2ML IJ SOLN
0.5000 mg | Freq: Once | INTRAMUSCULAR | Status: AC
  Administered 2023-11-12: 0.5 mg via INTRAVENOUS
  Filled 2023-11-12: qty 2

## 2023-11-12 MED ORDER — SODIUM CHLORIDE 0.9 % IV BOLUS
500.0000 mL | Freq: Once | INTRAVENOUS | Status: AC
  Administered 2023-11-12: 500 mL via INTRAVENOUS

## 2023-11-12 MED ORDER — DIAZEPAM 5 MG/ML IJ SOLN
2.5000 mg | Freq: Once | INTRAMUSCULAR | Status: AC
  Administered 2023-11-12: 2.5 mg via INTRAVENOUS
  Filled 2023-11-12: qty 2

## 2023-11-12 MED ORDER — ASPIRIN 81 MG PO CHEW
324.0000 mg | CHEWABLE_TABLET | Freq: Once | ORAL | Status: AC
  Administered 2023-11-12: 324 mg via ORAL
  Filled 2023-11-12: qty 4

## 2023-11-12 MED ORDER — CLOPIDOGREL BISULFATE 300 MG PO TABS
300.0000 mg | ORAL_TABLET | Freq: Once | ORAL | Status: AC
  Administered 2023-11-12: 300 mg via ORAL
  Filled 2023-11-12: qty 1

## 2023-11-12 NOTE — ED Notes (Signed)
 Patient transported to CT

## 2023-11-12 NOTE — ED Notes (Signed)
 CCMD called to place the patient on cardiac monitoring services.

## 2023-11-12 NOTE — Consult Note (Signed)
 NEUROLOGY CONSULT NOTE   Date of service: November 12, 2023 Patient Name: Terry Gray MRN:  [id number] DOB:  [birth year] Chief Complaint: Dizziness Requesting Provider: Maree Bracken D, DO  History of Present Illness  Terry Gray is a 89 y.o. male with hx of prior stroke/TIA with no residual deficit, prior C-spine surgeries with residual bilateral lower extremity weakness, Hypertension, hyperlipidemia, history of atrial fibrillation not on anticoagulation for unclear reasons, presents for evaluation of worsening dizziness over a couple days and difficulty with not being able to articulate words properly for almost over a week.  He reports that symptoms started somewhat insidiously but over the last 2 days he has been more dizzy-which he describes as lightheadedness and it worsens with movement. He has had trouble with his lower extremities and walking ever since his spine surgery couple of years ago. MRI brain performed in the ED revealed a small acute infarct in the periventricular left posterior temporal lobe.  He was admitted for further workup.  LKW: Unclear-many days ago Modified rankin score: 4-Needs assistance to walk and tend to bodily needs IV Thrombolysis: No-unclear last known well which was definitely outside the window EVT: Same as above  NIHSS components Score: Comment  1a Level of Conscious 0[x]  1[]  2[]  3[]      1b LOC Questions 0[x]  1[]  2[]       1c LOC Commands 0[x]  1[]  2[]       2 Best Gaze 0[]  1[x]  2[]       3 Visual 0[x]  1[]  2[]  3[]      4 Facial Palsy 0[x]  1[]  2[]  3[]      5a Motor Arm - left 0[x]  1[]  2[]  3[]  4[]  UN[]    5b Motor Arm - Right 0[x]  1[]  2[]  3[]  4[]  UN[]    6a Motor Leg - Left 0[]  1[x]  2[]  3[]  4[]  UN[]    6b Motor Leg - Right 0[]  1[x]  2[]  3[]  4[]  UN[]    7 Limb Ataxia 0[x]  1[]  2[]  UN[]      8 Sensory 0[x]  1[]  2[]  UN[]      9 Best Language 0[x]  1[]  2[]  3[]      10 Dysarthria 0[x]  1[]  2[]  UN[]      11 Extinct. and Inattention 0[x]  1[]  2[]       TOTAL: 3       ROS  Comprehensive ROS performed and pertinent positives documented in HPI   Past History   Past Medical History:  Diagnosis Date   Arthritis    Chronic lower back pain    Complication of anesthesia    blood pressure dropped low in dentist office during dental work- one 1 time   DOE (dyspnea on exertion)    Dysrhythmia    Hx. intermittent Atrial Fibrilllation   GERD (gastroesophageal reflux disease)    04/30/21- not current   Hepatitis B ~ 2013   suspected; [facility]   High cholesterol    Hypertension    New onset atrial fibrillation (HCC) 07/05/2013   Shortness of breath    Stroke (HCC) ~ 2012   residual right side decreased sensation   TIA (transient ischemic attack) 03/07/2010   of unknown origin/notes 07/05/2013; I've had 3 or 4; call it stroke sometimes; TIA sometimes   Vertigo 07/05/2013    Past Surgical History:  Procedure Laterality Date   ANTERIOR CERVICAL DECOMP/DISCECTOMY FUSION N/A 05/03/2021   Procedure: ANTERIOR CERVICAL DECOMPRESSION /DISCECTOMY Terry Gray ,PLATE/SCREWS CERVICAL FOUR- FIVE, CERVICAL FIVE- SIX, CERVICAL SIX- SEVEN;  Surgeon: Mavis Purchase, MD;  Location: [facility] OR;  Service:  Neurosurgery;  Laterality: N/A;   CAPSULOTOMY  04/03/2012   Procedure: MINOR CAPSULOTOMY;  Surgeon: Debby FORBES Sharalyn Mickey., MD;  Location: [facility] OR;  Service: Ophthalmology;  Laterality: Right;   CATARACT EXTRACTION W/PHACO  02/01/2012   Procedure: CATARACT EXTRACTION PHACO AND INTRAOCULAR LENS PLACEMENT (IOC);  Surgeon: Jestine Bunnell, MD;  Location: [facility] OR;  Service: Ophthalmology;  Laterality: Right;   COLONOSCOPY WITH PROPOFOL  N/A 11/28/2013   Procedure: COLONOSCOPY WITH PROPOFOL ;  Surgeon: Gladis MARLA Louder, MD;  Location: [facility] ENDOSCOPY;  Service: Endoscopy;  Laterality: N/A;   ESOPHAGOGASTRODUODENOSCOPY (EGD) WITH PROPOFOL  N/A 11/28/2013   Procedure: ESOPHAGOGASTRODUODENOSCOPY (EGD) WITH PROPOFOL ;  Surgeon: Gladis MARLA Louder, MD;  Location:  [facility] ENDOSCOPY;  Service: Endoscopy;  Laterality: N/A;   EYE SURGERY     YAG LASER APPLICATION  04/03/2012   Procedure: YAG LASER APPLICATION;  Surgeon: Debby FORBES Sharalyn Mickey., MD;  Location: [facility] OR;  Service: Ophthalmology;  Laterality: N/A;    Family History: History reviewed. No pertinent family history.  Social History  reports that he quit smoking about 57 years ago. His smoking use included cigarettes. He has never used smokeless tobacco. He reports current alcohol use. He reports that he does not use drugs.  No Known Allergies  Medications   Current Facility-Administered Medications:    aspirin  chewable tablet 324 mg, 324 mg, Oral, Once, Yolande Lamar BROCKS, MD   clopidogrel  (PLAVIX ) tablet 300 mg, 300 mg, Oral, Once, Yolande Lamar BROCKS, MD  Current Outpatient Medications:    aspirin  EC 81 MG tablet, Take 81 mg by mouth daily. Swallow whole., Disp: , Rfl:    atorvastatin  (LIPITOR) 10 MG tablet, Take 10 mg by mouth every evening., Disp: , Rfl:    docusate sodium  (COLACE) 100 MG capsule, Take 100 mg by mouth daily., Disp: , Rfl:    finasteride  (PROSCAR ) 5 MG tablet, Take 5 mg by mouth daily., Disp: , Rfl:    oxyCODONE -acetaminophen  (PERCOCET/ROXICET) 5-325 MG tablet, Take 1-2 tablets by mouth every 4 (four) hours as needed for moderate pain., Disp: 30 tablet, Rfl: 0   ramipril  (ALTACE ) 10 MG capsule, Take 10 mg by mouth every morning. , Disp: , Rfl:    Vitamin D , Ergocalciferol , (DRISDOL) 1.25 MG (50000 UNIT) CAPS capsule, Take 50,000 Units by mouth 2 (two) times a week., Disp: , Rfl:   Vitals   Vitals:   11/14/2023 1803 2023/11/14 1815 November 14, 2023 2251 11/14/23 2300  BP:  (!) 151/79 (!) 143/76 (!) 147/74  Pulse:  (!) 54 65 67  Resp:  (!) 22 13 (!) 21  Temp:   (!) 97.4 F (36.3 C)   TempSrc:   Oral   SpO2:  99% 97% 97%  Weight: 63.5 kg     Height: 4' 10 (1.473 m)       Body mass index is 29.26 kg/m.   Physical Exam   General: No acute distress HEENT: Normocephalic  atraumatic Lungs: Clear Cardiovascular: Regular rate rhythm Neurological exam He is awake alert oriented x 3 No gross dysarthria or aphasia Cranial nerves II to XII: Pupils equal round react light, extraocular movements are intact although his gaze appears to be disconjugate with the right esotropia.  Visual fields full.  Face symmetric.  Tongue and palate midline. Motor examination with no drift in the upper extremities.  Both lower extremities have drift-which is baseline. Sensation intact to light touch Coordination examination reveals no dysmetria on finger-nose-finger testing  Labs/Imaging/Neurodiagnostic studies   CBC:  Recent Labs  Lab 11-14-2023 1811  WBC 9.3  NEUTROABS 6.6  HGB 13.0  HCT 40.6  MCV 87.3  PLT 107*   Basic Metabolic Panel:  Lab Results  Component Value Date   NA 140 11/12/2023   K 4.1 11/12/2023   CO2 24 11/12/2023   GLUCOSE 108 (H) 11/12/2023   BUN 21 11/12/2023   CREATININE 0.97 11/12/2023   CALCIUM  8.5 (L) 11/12/2023   GFRNONAA >60 11/12/2023   GFRAA 69 (L) 07/06/2013   Lipid Panel:  Lab Results  Component Value Date   LDLCALC  08/04/2010    91        Total Cholesterol/HDL:CHD Risk Coronary Heart Disease Risk Table                     Men   Women  1/2 Average Risk   3.4   3.3  Average Risk       5.0   4.4  2 X Average Risk   9.6   7.1  3 X Average Risk  23.4   11.0        Use the calculated Patient Ratio above and the CHD Risk Table to determine the patient's CHD Risk.        ATP III CLASSIFICATION (LDL):  <100     mg/dL   Optimal  899-870  mg/dL   Near or Above                    Optimal  130-159  mg/dL   Borderline  839-810  mg/dL   High  >809     mg/dL   Very High   YhaJ8r:  Lab Results  Component Value Date   HGBA1C  08/04/2010    5.6 (NOTE)                                                                       According to the ADA Clinical Practice Recommendations for 2011, when HbA1c is used as a screening test:   >=6.5%    Diagnostic of Diabetes Mellitus           (if abnormal result  is confirmed)  5.7-6.4%   Increased risk of developing Diabetes Mellitus  References:Diagnosis and Classification of Diabetes Mellitus,Diabetes Care,2011,34(Suppl 1):S62-S69 and Standards of Medical Care in         Diabetes - 2011,Diabetes Care,2011,34  (Suppl 1):S11-S61.    INR  Lab Results  Component Value Date   INR 1.04 08/04/2010   APTT  Lab Results  Component Value Date   APTT 28 08/04/2010   Imaging personally reviewed MRI brain without contrast-small acute infarct in periventricular left temporal lobe.  Moderate chronic microvascular ischemic disease CT angiography head and neck-no ELVO, severe left and mild right vertebral artery origin stenosis.  Severe multilevel cervical degenerative change including severe craniocervical degenerative change.  Aortic atherosclerosis.  ASSESSMENT   AHMARI DUERSON is a 89 y.o. male past history of prior stroke/TIA with no residual deficits, C-spine surgeries in the past with residual bilateral lower extremity weakness, hypertension, hyperlipidemia, history of A-fib not on anticoagulation presenting for evaluation of dizziness for past couple of days Upon further history taking, he has been having disarticulation of his speech for over a week  now and started noticing some dizziness while moving a couple of days ago. MRI of the brain reveals a small infarct in the periventricular region in the temporal lobe on the left. CT angiography head and neck shows severe left and mild right vertebral artery origin stenosis. Some of his vertiginous symptoms might be due to VBI but stroke needs further workup.  Impression Acute ischemic stroke  RECOMMENDATIONS  Admit to hospitalist Frequent rechecks Telemetry Aspirin  81+ Plavix  75 High intensity statin for goal LDL less than 70 2D echo A1c Lipid panel Therapy assessments No need for permissive hypertension-goal blood pressure  normotension.  Avoid hypotension given vertebral artery stenosis bilaterally. Stroke team will follow Plan relayed to the Dr. Maree, admitting hospitalist  ______________________________________________________________________    Signed, Eligio Lav, MD [facility]

## 2023-11-12 NOTE — ED Provider Notes (Signed)
 [place] EMERGENCY DEPARTMENT AT [facility] Provider Note   CSN: [id number] Arrival date & time: 11/12/23  1731     Patient presents with: No chief complaint on file.   Terry Gray is a 89 y.o. male.   Pt is a 89 yo male with pmhx significant for htn, gerd, hld, cva, afib (no thinners), and arthritis.  Pt had some dizziness and headache yesterday around 1700 which got better.  Today, he developed dizziness again with n/v.  He said the rooms feels like it's spinning.  He was given zofran  en route and sx have improved a little, but he still feels dizzy.  He has a little bit of abd pain.  EMS reports HR dropped briefly to the 40s.       Prior to Admission medications   Medication Sig Start Date End Date Taking? Authorizing Provider  aspirin  EC 81 MG tablet Take 81 mg by mouth daily. Swallow whole.    [provider]  atorvastatin  (LIPITOR) 10 MG tablet Take 10 mg by mouth every evening. 04/19/21   [provider]  docusate sodium  (COLACE) 100 MG capsule Take 100 mg by mouth daily. 04/19/21   [provider]  finasteride  (PROSCAR ) 5 MG tablet Take 5 mg by mouth daily.    [provider]  oxyCODONE -acetaminophen  (PERCOCET/ROXICET) 5-325 MG tablet Take 1-2 tablets by mouth every 4 (four) hours as needed for moderate pain. 05/05/21   Mavis Purchase, MD  ramipril  (ALTACE ) 10 MG capsule Take 10 mg by mouth every morning.  05/23/13   [provider]  Vitamin D , Ergocalciferol , (DRISDOL) 1.25 MG (50000 UNIT) CAPS capsule Take 50,000 Units by mouth 2 (two) times a week.    [provider]    Allergies: Patient has no known allergies.    Review of Systems  Gastrointestinal:  Positive for abdominal pain, nausea and vomiting.  Neurological:  Positive for dizziness.  All other systems reviewed and are negative.   Updated Vital Signs BP (!) 151/79   Pulse (!) 54   Temp (!) 97.4 F (36.3 C) (Oral)   Resp (!) 22   Ht 4' 10  (1.473 m)   Wt 63.5 kg   SpO2 99%   BMI 29.26 kg/m   Physical Exam Vitals and nursing note reviewed.  Constitutional:      Appearance: Normal appearance.  HENT:     Head: Normocephalic and atraumatic.     Right Ear: External ear normal.     Left Ear: External ear normal.     Nose: Nose normal.     Mouth/Throat:     Mouth: Mucous membranes are moist.     Pharynx: Oropharynx is clear.  Eyes:     Extraocular Movements: Extraocular movements intact.     Conjunctiva/sclera: Conjunctivae normal.     Pupils: Pupils are equal, round, and reactive to light.  Cardiovascular:     Rate and Rhythm: Normal rate and regular rhythm.     Pulses: Normal pulses.     Heart sounds: Normal heart sounds.  Pulmonary:     Effort: Pulmonary effort is normal.     Breath sounds: Normal breath sounds.  Abdominal:     General: Abdomen is flat. Bowel sounds are normal.     Palpations: Abdomen is soft.  Musculoskeletal:        General: Normal range of motion.     Cervical back: Normal range of motion and neck supple.  Skin:    General:  Skin is warm.     Capillary Refill: Capillary refill takes less than 2 seconds.  Neurological:     General: No focal deficit present.     Mental Status: He is alert and oriented to person, place, and time.  Psychiatric:        Mood and Affect: Mood normal.        Behavior: Behavior normal.     (all labs ordered are listed, but only abnormal results are displayed) Labs Reviewed  CBC WITH DIFFERENTIAL/PLATELET - Abnormal; Notable for the following components:      Result Value   Platelets 107 (*)    All other components within normal limits  COMPREHENSIVE METABOLIC PANEL WITH GFR - Abnormal; Notable for the following components:   Glucose, Bld 108 (*)    Calcium  8.5 (*)    Total Protein 6.2 (*)    Albumin 3.2 (*)    All other components within normal limits  TROPONIN I (HIGH SENSITIVITY) - Abnormal; Notable for the following components:   Troponin I (High  Sensitivity) 28 (*)    All other components within normal limits  TROPONIN I (HIGH SENSITIVITY) - Abnormal; Notable for the following components:   Troponin I (High Sensitivity) 28 (*)    All other components within normal limits  RESP PANEL BY RT-PCR (RSV, FLU A&B, COVID)  RVPGX2  LIPASE, BLOOD  URINALYSIS, ROUTINE W REFLEX MICROSCOPIC    EKG: EKG Interpretation Date/Time:  Sunday November 12 2023 18:06:58 EDT Ventricular Rate:  52 PR Interval:    QRS Duration:  114 QT Interval:  464 QTC Calculation: 432 R Axis:   40  Text Interpretation: Normal sinus rhythm No significant change since last tracing Confirmed by Dean Clarity [phone]) on 11/12/2023 6:26:04 PM  Radiology: DG Chest Portable 1 View Result Date: 11/12/2023 CLINICAL DATA:  Short of breath, chest pain, dizziness EXAM: PORTABLE CHEST 1 VIEW COMPARISON:  04/28/2021 FINDINGS: Single frontal view of the chest demonstrates an unremarkable cardiac silhouette. No airspace disease, effusion, or pneumothorax. No acute bony abnormality. IMPRESSION: 1. No acute intrathoracic process. Electronically Signed   By: Ozell Daring M.D.   On: 11/12/2023 19:57   CT Head Wo Contrast Result Date: 11/12/2023 CLINICAL DATA:  Neuro deficit, acute, stroke suspected. EXAM: CT HEAD WITHOUT CONTRAST TECHNIQUE: Contiguous axial images were obtained from the base of the skull through the vertex without intravenous contrast. RADIATION DOSE REDUCTION: This exam was performed according to the departmental dose-optimization program which includes automated exposure control, adjustment of the mA and/or kV according to patient size and/or use of iterative reconstruction technique. COMPARISON:  Head CT 04/28/2021 and MRI 07/05/2013 FINDINGS: Brain: There is no evidence of an acute infarct, intracranial hemorrhage, mass, midline shift, or extra-axial fluid collection. Mild cerebral atrophy is within normal limits for age. Cerebral white matter hypodensities are  similar to the prior CT and are nonspecific but compatible with mild chronic small vessel ischemic disease. Chronic lacunar infarcts are again noted in the left thalamus and right cerebellar hemisphere. Vascular: Calcified atherosclerosis at the skull base. No hyperdense vessel. Skull: No fracture or suspicious lesion. Sinuses/Orbits: Paranasal sinuses and mastoid air cells are clear. Bilateral cataract extraction. Other: None. IMPRESSION: 1. No evidence of acute intracranial abnormality. 2. Mild chronic small vessel ischemic disease. Electronically Signed   By: Dasie Hamburg M.D.   On: 11/12/2023 18:38     Procedures   Medications Ordered in the ED  midazolam  (VERSED ) injection 0.5 mg (0.5 mg Intravenous Given 11/12/23  1814)  meclizine  (ANTIVERT ) tablet 25 mg (25 mg Oral Given 11/12/23 1932)  sodium chloride  0.9 % bolus 500 mL (500 mLs Intravenous New Bag/Given 11/12/23 1934)                                    Medical Decision Making Amount and/or Complexity of Data Reviewed Labs: ordered. Radiology: ordered.  Risk Prescription drug management.   This patient presents to the ED for concern of dizziness, this involves an extensive number of treatment options, and is a complaint that carries with it a high risk of complications and morbidity.  The differential diagnosis includes vertigo, cva, electrolyte abn, infection, bradycardia   Co morbidities that complicate the patient evaluation  htn, gerd, hld, cva, afib (no thinners), and arthritis   Additional history obtained:  Additional history obtained from epic chart review External records from outside source obtained and reviewed including EMS report   Lab Tests:  I Ordered, and personally interpreted labs.  The pertinent results include:  cbc nl, cmp nl, lip nl, trop flat at 28, ua  nl   Imaging Studies ordered:  I ordered imaging studies including cxr, ct head  I independently visualized and interpreted imaging which showed   CXR: No evidence of acute intracranial abnormality.  2. Mild chronic small vessel ischemic disease.  CT head: . No evidence of acute intracranial abnormality.  2. Mild chronic small vessel ischemic disease.  MRI brain: pending at shift change I agree with the radiologist interpretation   Cardiac Monitoring:  The patient was maintained on a cardiac monitor.  I personally viewed and interpreted the cardiac monitored which showed an underlying rhythm of: sb   Medicines ordered and prescription drug management:  I ordered medication including ivfs, versed , antivert   for sx  Reevaluation of the patient after these medicines showed that the patient improved I have reviewed the patients home medicines and have made adjustments as needed   Test Considered:  Ct/mri   Problem List / ED Course:  Dizziness:  improved, but is still there.  MRI brain pending.     Reevaluation:  After the interventions noted above, I reevaluated the patient and found that they have :improved   Social Determinants of Health:  Lives at home   Dispostion:  Pending at shift change     Final diagnoses:  Dizziness  Nausea and vomiting, unspecified vomiting type    ED Discharge Orders     None          Dean Clarity, MD 11/12/23 2119

## 2023-11-12 NOTE — ED Triage Notes (Signed)
 According to [facility]:Pt is coming from home. Pt has  had an onset of dizziness yesterday at 1700 with headache. Today pt has had an onset of dry heaving and nausea at 1645. Pt is now complaining of left upper quadrant pain. Now complaining of left upper quadrant pain in abdomen. Pt was diaphoretic on ems arrival. Pt was found to be bradycardiac sitting at low 50s. Pt has multiple pvc and had an incidents of trending down to low high 30 low 40s in hr. Pt has also been feeling sob without presenting respiratory distress.  Pt has an 18 gauge piv in his left ac.During the ride with ems pt had 4mg  of zofran  and 200 cc of normal saline. Pt was put on 2 l of oxygen for comfort.   On arrival pt told ems his nausea had improved along with his dizziness's.  Pt has had hx of stroke and tia however his stroke screen was negative.  Vitals: Hr 52 Spo2 100% [id number]

## 2023-11-12 NOTE — ED Notes (Signed)
 Patient returned from CT. Hospitalist at bedside

## 2023-11-12 NOTE — ED Provider Notes (Signed)
  Physical Exam  BP (!) 151/79   Pulse (!) 54   Temp (!) 97.4 F (36.3 C) (Oral)   Resp (!) 22   Ht 4' 10 (1.473 m)   Wt 63.5 kg   SpO2 99%   BMI 29.26 kg/m   Physical Exam  Procedures  Procedures  ED Course / MDM   Clinical Course as of 11/12/23 2320  Sun Nov 12, 2023  2119 Assumed care from Dr Dean. 89 yo male with pmhx significant for htn, gerd, hld, cva, afib (no thinners), and arthritis who presented with acute onset dizziness but didn't get better with versed  and antivert . LKW 1700 yesterday. Getting MRI. Awaiting covid swab.  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  Discussed with Dr. Deedra from neurology recommends admission.  Given aspirin  and Plavix .  CT angio ordered for vessel imaging.  Hospitalist consulted [RP]  2319 Dr Maree from hospitalist consulted for admission.  [RP]    Clinical Course User Index [RP] Yolande Lamar BROCKS, MD   Medical Decision Making Amount and/or Complexity of Data Reviewed Labs: ordered. Radiology: ordered.  Risk OTC drugs. Prescription drug management. Decision regarding hospitalization.      Yolande Lamar BROCKS, MD 11/12/23 [phone]

## 2023-11-13 ENCOUNTER — Observation Stay (HOSPITAL_BASED_OUTPATIENT_CLINIC_OR_DEPARTMENT_OTHER)

## 2023-11-13 DIAGNOSIS — I6350 Cerebral infarction due to unspecified occlusion or stenosis of unspecified cerebral artery: Secondary | ICD-10-CM | POA: Diagnosis not present

## 2023-11-13 DIAGNOSIS — I739 Peripheral vascular disease, unspecified: Secondary | ICD-10-CM

## 2023-11-13 DIAGNOSIS — R29703 NIHSS score 3: Secondary | ICD-10-CM | POA: Diagnosis not present

## 2023-11-13 DIAGNOSIS — E785 Hyperlipidemia, unspecified: Secondary | ICD-10-CM

## 2023-11-13 DIAGNOSIS — Z87891 Personal history of nicotine dependence: Secondary | ICD-10-CM | POA: Diagnosis not present

## 2023-11-13 DIAGNOSIS — I6389 Other cerebral infarction: Secondary | ICD-10-CM

## 2023-11-13 DIAGNOSIS — I69391 Dysphagia following cerebral infarction: Secondary | ICD-10-CM | POA: Diagnosis not present

## 2023-11-13 DIAGNOSIS — I639 Cerebral infarction, unspecified: Secondary | ICD-10-CM | POA: Diagnosis not present

## 2023-11-13 DIAGNOSIS — R42 Dizziness and giddiness: Principal | ICD-10-CM

## 2023-11-13 LAB — ECHOCARDIOGRAM COMPLETE
AR max vel: 2.67 cm2
AV Peak grad: 6.1 mmHg
Ao pk vel: 1.23 m/s
Area-P 1/2: 2.76 cm2
Height: 58 in
S' Lateral: 2.3 cm
Weight: 2240 [oz_av]

## 2023-11-13 LAB — LIPID PANEL
Cholesterol: 141 mg/dL (ref 0–200)
HDL: 54 mg/dL (ref >40)
LDL Cholesterol: 83 mg/dL (ref 0–99)
Total CHOL/HDL Ratio: 2.6 ratio
Triglycerides: 18 mg/dL (ref <150)
VLDL: 4 mg/dL (ref 0–40)

## 2023-11-13 LAB — HEMOGLOBIN A1C
Hgb A1c MFr Bld: 5.6 % (ref 4.8–5.6)
Mean Plasma Glucose: 114.02 mg/dL

## 2023-11-13 MED ORDER — FINASTERIDE 5 MG PO TABS
5.0000 mg | ORAL_TABLET | Freq: Every day | ORAL | Status: DC
  Administered 2023-11-13 – 2023-11-14 (×2): 5 mg via ORAL
  Filled 2023-11-13 (×2): qty 1

## 2023-11-13 MED ORDER — ACETAMINOPHEN 160 MG/5ML PO SOLN: 650.0000 mg | ORAL | Status: DC | PRN

## 2023-11-13 MED ORDER — DOCUSATE SODIUM 100 MG PO CAPS
100.0000 mg | ORAL_CAPSULE | Freq: Every day | ORAL | Status: DC
  Administered 2023-11-13 – 2023-11-14 (×2): 100 mg via ORAL
  Filled 2023-11-13 (×2): qty 1

## 2023-11-13 MED ORDER — STROKE: EARLY STAGES OF RECOVERY BOOK
Freq: Once | Status: AC
  Filled 2023-11-13: qty 1

## 2023-11-13 MED ORDER — RAMIPRIL 5 MG PO CAPS
10.0000 mg | ORAL_CAPSULE | Freq: Every morning | ORAL | Status: DC
Start: 2023-11-13 — End: 2023-11-14
  Administered 2023-11-13 – 2023-11-14 (×2): 10 mg via ORAL
  Filled 2023-11-13 (×2): qty 2

## 2023-11-13 MED ORDER — ACETAMINOPHEN 325 MG PO TABS: 650.0000 mg | ORAL_TABLET | ORAL | Status: DC | PRN

## 2023-11-13 MED ORDER — CLOPIDOGREL BISULFATE 75 MG PO TABS
75.0000 mg | ORAL_TABLET | Freq: Every day | ORAL | Status: DC
  Administered 2023-11-13 – 2023-11-14 (×2): 75 mg via ORAL
  Filled 2023-11-13 (×2): qty 1

## 2023-11-13 MED ORDER — ATORVASTATIN CALCIUM 40 MG PO TABS
40.0000 mg | ORAL_TABLET | Freq: Every evening | ORAL | Status: DC
  Administered 2023-11-13: 40 mg via ORAL
  Filled 2023-11-13: qty 1

## 2023-11-13 MED ORDER — POLYETHYLENE GLYCOL 3350 17 G PO PACK
17.0000 g | PACK | Freq: Every day | ORAL | Status: DC
  Administered 2023-11-13 – 2023-11-14 (×2): 17 g via ORAL
  Filled 2023-11-13 (×2): qty 1

## 2023-11-13 MED ORDER — ATORVASTATIN CALCIUM 10 MG PO TABS: 10.0000 mg | ORAL_TABLET | Freq: Every evening | ORAL | Status: DC

## 2023-11-13 MED ORDER — ACETAMINOPHEN 650 MG RE SUPP
650.0000 mg | RECTAL | Status: DC | PRN
Start: 2023-11-13 — End: 2023-11-14

## 2023-11-13 MED ORDER — ASPIRIN 81 MG PO TBEC
81.0000 mg | DELAYED_RELEASE_TABLET | Freq: Every day | ORAL | Status: DC
  Administered 2023-11-13 – 2023-11-14 (×2): 81 mg via ORAL
  Filled 2023-11-13 (×2): qty 1

## 2023-11-13 NOTE — H&P (Addendum)
 History and Physical    Terry Gray FMW:[id number] DOB: 1934-03-03 DOA: 11/12/2023  PCP: Ransom Other, MD   Patient coming from: Home  Chief Complaint: Dizziness  HPI: Terry Gray is a 89 y.o. male with medical history significant for prior TIA, hypertension, dyslipidemia, arthritis, DDD, bradycardia, and BPH who presented to the ED with complaints of dizziness and headache that started on 9/6 around 1700 which spontaneously improved.  He developed the dizziness yet again with some nausea and vomiting on 9/7 which prompted him to come to the ED via EMS.  He was given some Zofran  and route with some symptomatic improvement but he continues to have dizziness and was noted to have heart rate that dropped briefly into the 40s.  Patient is from [place] and is apparently a [employer] there.   ED Course: Vital signs currently stable and patient afebrile.  No significant lab abnormalities aside from troponin of 28.  Brain MRI demonstrates acute CVA to left temporal region.  EDP has notified neurology who has recommended admission for further evaluation.  Review of Systems: Reviewed as noted above, otherwise negative.  Past Medical History:  Diagnosis Date   Arthritis    Chronic lower back pain    Complication of anesthesia    blood pressure dropped low in dentist office during dental work- one 1 time   DOE (dyspnea on exertion)    Dysrhythmia    Hx. intermittent Atrial Fibrilllation   GERD (gastroesophageal reflux disease)    04/30/21- not current   Hepatitis B ~ 2013   suspected; [facility]   High cholesterol    Hypertension    New onset atrial fibrillation (HCC) 07/05/2013   Shortness of breath    Stroke (HCC) ~ 2012   residual right side decreased sensation   TIA (transient ischemic attack) 03/07/2010   of unknown origin/notes 07/05/2013; I've had 3 or 4; call it stroke sometimes; TIA sometimes   Vertigo 07/05/2013    Past Surgical History:  Procedure  Laterality Date   ANTERIOR CERVICAL DECOMP/DISCECTOMY FUSION N/A 05/03/2021   Procedure: ANTERIOR CERVICAL DECOMPRESSION /DISCECTOMY CORTEZ MESSICK PROSTHESIS ,PLATE/SCREWS CERVICAL FOUR- FIVE, CERVICAL FIVE- SIX, CERVICAL SIX- SEVEN;  Surgeon: Mavis Purchase, MD;  Location: [facility] OR;  Service: Neurosurgery;  Laterality: N/A;   CAPSULOTOMY  04/03/2012   Procedure: MINOR CAPSULOTOMY;  Surgeon: Debby FORBES Sharalyn Mickey., MD;  Location: [facility] OR;  Service: Ophthalmology;  Laterality: Right;   CATARACT EXTRACTION W/PHACO  02/01/2012   Procedure: CATARACT EXTRACTION PHACO AND INTRAOCULAR LENS PLACEMENT (IOC);  Surgeon: Jestine Bunnell, MD;  Location: [facility] OR;  Service: Ophthalmology;  Laterality: Right;   COLONOSCOPY WITH PROPOFOL  N/A 11/28/2013   Procedure: COLONOSCOPY WITH PROPOFOL ;  Surgeon: Gladis MARLA Louder, MD;  Location: [facility] ENDOSCOPY;  Service: Endoscopy;  Laterality: N/A;   ESOPHAGOGASTRODUODENOSCOPY (EGD) WITH PROPOFOL  N/A 11/28/2013   Procedure: ESOPHAGOGASTRODUODENOSCOPY (EGD) WITH PROPOFOL ;  Surgeon: Gladis MARLA Louder, MD;  Location: [facility]COPY;  Service: Endoscopy;  Laterality: N/A;   EYE SURGERY     YAG LASER APPLICATION  04/03/2012   Procedure: YAG LASER APPLICATION;  Surgeon: Debby FORBES Sharalyn Mickey., MD;  Location: [facility] OR;  Service: Ophthalmology;  Laterality: N/A;     reports that he quit smoking about 57 years ago. His smoking use included cigarettes. He has never used smokeless tobacco. He reports current alcohol use. He reports that he does not use drugs.  No Known Allergies  History reviewed. No pertinent family history.  Prior to Admission medications  Medication Sig Start Date End Date Taking? Authorizing Provider  aspirin  EC 81 MG tablet Take 81 mg by mouth daily. Swallow whole.    [provider]  atorvastatin  (LIPITOR) 10 MG tablet Take 10 mg by mouth every evening. 04/19/21   [provider]  docusate sodium  (COLACE) 100 MG capsule Take 100 mg by mouth daily.  04/19/21   [provider]  finasteride  (PROSCAR ) 5 MG tablet Take 5 mg by mouth daily.    [provider]  oxyCODONE -acetaminophen  (PERCOCET/ROXICET) 5-325 MG tablet Take 1-2 tablets by mouth every 4 (four) hours as needed for moderate pain. 05/05/21   Mavis Purchase, MD  ramipril  (ALTACE ) 10 MG capsule Take 10 mg by mouth every morning.  05/23/13   [provider]  Vitamin D , Ergocalciferol , (DRISDOL) 1.25 MG (50000 UNIT) CAPS capsule Take 50,000 Units by mouth 2 (two) times a week.    [provider]    Physical Exam: Vitals:   11/12/23 2251 11/12/23 2300 11/12/23 2315 11/13/23 0010  BP: (!) 143/76 (!) 147/74 (!) 144/84 (!) 144/72  Pulse: 65 67 69 65  Resp: 13 (!) 21 17 16   Temp: (!) 97.4 F (36.3 C)   (!) 97.4 F (36.3 C)  TempSrc: Oral   Oral  SpO2: 97% 97% 99% 96%  Weight:      Height:        Constitutional: NAD, calm, comfortable Vitals:   11/12/23 2251 11/12/23 2300 11/12/23 2315 11/13/23 0010  BP: (!) 143/76 (!) 147/74 (!) 144/84 (!) 144/72  Pulse: 65 67 69 65  Resp: 13 (!) 21 17 16   Temp: (!) 97.4 F (36.3 C)   (!) 97.4 F (36.3 C)  TempSrc: Oral   Oral  SpO2: 97% 97% 99% 96%  Weight:      Height:       Eyes: lids and conjunctivae normal Neck: normal, supple Respiratory: clear to auscultation bilaterally. Normal respiratory effort. No accessory muscle use.  Cardiovascular: Regular rate and rhythm, no murmurs. Abdomen: no tenderness, no distention. Bowel sounds positive.  Musculoskeletal:  No edema. Skin: no rashes, lesions, ulcers.  Psychiatric: Flat affect  Labs on Admission: I have personally reviewed following labs and imaging studies  CBC: Recent Labs  Lab 11/12/23 1811  WBC 9.3  NEUTROABS 6.6  HGB 13.0  HCT 40.6  MCV 87.3  PLT 107*   Basic Metabolic Panel: Recent Labs  Lab 11/12/23 1811  NA 140  K 4.1  CL 108  CO2 24  GLUCOSE 108*  BUN 21  CREATININE 0.97  CALCIUM  8.5*   GFR: Estimated Creatinine  Clearance: 39.9 mL/min (by C-G formula based on SCr of 0.97 mg/dL). Liver Function Tests: Recent Labs  Lab 11/12/23 1811  AST 23  ALT 14  ALKPHOS 45  BILITOT 0.7  PROT 6.2*  ALBUMIN 3.2*   Recent Labs  Lab 11/12/23 1811  LIPASE 51   No results for input(s): AMMONIA in the last 168 hours. Coagulation Profile: No results for input(s): INR, PROTIME in the last 168 hours. Cardiac Enzymes: No results for input(s): CKTOTAL, CKMB, CKMBINDEX, TROPONINI in the last 168 hours. BNP (last 3 results) No results for input(s): PROBNP in the last 8760 hours. HbA1C: No results for input(s): HGBA1C in the last 72 hours. CBG: No results for input(s): GLUCAP in the last 168 hours. Lipid Profile: No results for input(s): CHOL, HDL, LDLCALC, TRIG, CHOLHDL, LDLDIRECT in the last 72 hours. Thyroid  Function Tests: No results for input(s): TSH, T4TOTAL, FREET4,  T3FREE, THYROIDAB in the last 72 hours. Anemia Panel: No results for input(s): VITAMINB12, FOLATE, FERRITIN, TIBC, IRON, RETICCTPCT in the last 72 hours. Urine analysis:    Component Value Date/Time   COLORURINE YELLOW 11/12/2023 1811   APPEARANCEUR CLEAR 11/12/2023 1811   LABSPEC 1.018 11/12/2023 1811   PHURINE 6.0 11/12/2023 1811   GLUCOSEU NEGATIVE 11/12/2023 1811   HGBUR NEGATIVE 11/12/2023 1811   BILIRUBINUR NEGATIVE 11/12/2023 1811   KETONESUR NEGATIVE 11/12/2023 1811   PROTEINUR NEGATIVE 11/12/2023 1811   UROBILINOGEN 0.2 07/05/2013 0935   NITRITE NEGATIVE 11/12/2023 1811   LEUKOCYTESUR NEGATIVE 11/12/2023 1811    Radiological Exams on Admission: CT ANGIO HEAD NECK W WO CM Result Date: 11/13/2023 CLINICAL DATA:  stroke, vertigo EXAM: CT ANGIOGRAPHY HEAD AND NECK WITH AND WITHOUT CONTRAST TECHNIQUE: Multidetector CT imaging of the head and neck was performed using the standard protocol during bolus administration of intravenous contrast. Multiplanar CT image reconstructions  and MIPs were obtained to evaluate the vascular anatomy. Carotid stenosis measurements (when applicable) are obtained utilizing NASCET criteria, using the distal internal carotid diameter as the denominator. RADIATION DOSE REDUCTION: This exam was performed according to the departmental dose-optimization program which includes automated exposure control, adjustment of the mA and/or kV according to patient size and/or use of iterative reconstruction technique. CONTRAST:  75mL OMNIPAQUE  IOHEXOL  350 MG/ML SOLN COMPARISON:  MRA Jul 05, 2013. FINDINGS: CTA NECK FINDINGS Aortic arch: Aortic atherosclerosis. Great vessel origins are patent. Right carotid system: No evidence of dissection, stenosis (50% or greater), or occlusion. Left carotid system: No evidence of dissection, stenosis (50% or greater), or occlusion. Vertebral arteries: Severe left and mild right vertebral artery origin stenosis. Patent bilaterally. Skeleton: Severe multilevel cervical degenerative change including severe craniocervical degenerative change. Other neck: No acute abnormality on limited assessment. Upper chest: Lung apices are clear. Review of the MIP images confirms the above findings CTA HEAD FINDINGS Anterior circulation: Bilateral intracranial ICAs, MCAs, and ACAs are patent without proximal hemodynamically significant stenosis. Posterior circulation: Bilateral intradural vertebral arteries, basilar artery and bilateral push Fillers are patent without proximal hemodynamically significant stenosis. Venous sinuses: As permitted by contrast timing, patent. Review of the MIP images confirms the above findings IMPRESSION: 1. No emergent large vessel occlusion. 2. Severe left and mild right vertebral artery origin stenosis. 3. Severe multilevel cervical degenerative change including severe craniocervical degenerative change. 4. Aortic Atherosclerosis (ICD10-I70.0). Electronically Signed   By: Gilmore GORMAN Molt M.D.   On: 11/13/2023 00:16   MR  BRAIN WO CONTRAST Result Date: 11/12/2023 CLINICAL DATA:  Neuro deficit, acute, stroke suspected EXAM: MRI HEAD WITHOUT CONTRAST TECHNIQUE: Multiplanar, multiecho pulse sequences of the brain and surrounding structures were obtained without intravenous contrast. COMPARISON:  None Available. FINDINGS: Brain: Small acute infarct in the periventricular left posterior temporal lobe. No acute hemorrhage, hydrocephalus, extra-axial collection or mass lesion. Small remote right cerebellar infarct and moderate T2/FLAIR hyperintensities in the white matter compatible with chronic microvascular ischemic change. Vascular: Normal flow voids. Skull and upper cervical spine: Normal marrow signal. Sinuses/Orbits: Negative. IMPRESSION: 1. Small acute infarct in the periventricular left posterior temporal lobe. 2. Moderate chronic microvascular ischemic disease. Electronically Signed   By: Gilmore GORMAN Molt M.D.   On: 11/12/2023 22:55   DG Chest Portable 1 View Result Date: 11/12/2023 CLINICAL DATA:  Short of breath, chest pain, dizziness EXAM: PORTABLE CHEST 1 VIEW COMPARISON:  04/28/2021 FINDINGS: Single frontal view of the chest demonstrates an unremarkable cardiac silhouette. No airspace disease, effusion, or pneumothorax. No acute  bony abnormality. IMPRESSION: 1. No acute intrathoracic process. Electronically Signed   By: Ozell Daring M.D.   On: 11/12/2023 19:57   CT Head Wo Contrast Result Date: 11/12/2023 CLINICAL DATA:  Neuro deficit, acute, stroke suspected. EXAM: CT HEAD WITHOUT CONTRAST TECHNIQUE: Contiguous axial images were obtained from the base of the skull through the vertex without intravenous contrast. RADIATION DOSE REDUCTION: This exam was performed according to the departmental dose-optimization program which includes automated exposure control, adjustment of the mA and/or kV according to patient size and/or use of iterative reconstruction technique. COMPARISON:  Head CT 04/28/2021 and MRI 07/05/2013  FINDINGS: Brain: There is no evidence of an acute infarct, intracranial hemorrhage, mass, midline shift, or extra-axial fluid collection. Mild cerebral atrophy is within normal limits for age. Cerebral white matter hypodensities are similar to the prior CT and are nonspecific but compatible with mild chronic small vessel ischemic disease. Chronic lacunar infarcts are again noted in the left thalamus and right cerebellar hemisphere. Vascular: Calcified atherosclerosis at the skull base. No hyperdense vessel. Skull: No fracture or suspicious lesion. Sinuses/Orbits: Paranasal sinuses and mastoid air cells are clear. Bilateral cataract extraction. Other: None. IMPRESSION: 1. No evidence of acute intracranial abnormality. 2. Mild chronic small vessel ischemic disease. Electronically Signed   By: Dasie Hamburg M.D.   On: 11/12/2023 18:38    EKG: Independently reviewed.  NSR 52 bpm.  Assessment/Plan Principal Problem:   CVA (cerebral vascular accident) (HCC) Active Problems:   Essential hypertension   Elevated lipids   TIA (transient ischemic attack)   Cervical stenosis of spinal canal   Cervical spondylosis with myelopathy and radiculopathy    Acute left temporal lobe CVA - Patient has received loading dose aspirin  and Plavix  in ED - Appreciate neurology recommendations with plans to continue ASA and Plavix  - PT/OT evaluation - 2D echocardiogram - Lipid panel and hemoglobin A1c  Elevated troponin -Minimal and without change - Check 2D echocardiogram as noted - No chest pain noted  Hypertension - Ok to resume ramipril   Dyslipidemia - Continue statin  History of bradycardia - Currently with stable heart rate readings, continue to monitor on telemetry - Not on AV nodal blockade agents  BPH - Continue finasteride    DVT prophylaxis: SCDs Code Status: Full Family Communication: Son at bedside 9/8 Disposition Plan: Admit for stroke evaluation Consults called: Neurology Admission  status: Observation, telemetry  Severity of Illness: The appropriate patient status for this patient is OBSERVATION. Observation status is judged to be reasonable and necessary in order to provide the required intensity of service to ensure the patient's safety. The patient's presenting symptoms, physical exam findings, and initial radiographic and laboratory data in the context of their medical condition is felt to place them at decreased risk for further clinical deterioration. Furthermore, it is anticipated that the patient will be medically stable for discharge from the hospital within 2 midnights of admission.    Garnett Nunziata D Maree DO [facility]  If 7PM-7AM, please contact night-coverage [email or web address]  11/13/2023, 12:54 AM

## 2023-11-13 NOTE — TOC Initial Note (Signed)
 Transition of Care [facility]) - Initial/Assessment Note    Patient Details  Name: Terry Gray MRN: [id number] Date of Birth: 10-05-1933  [facility] of [facility]) CM/SW Contact:    Andrez JULIANNA George, RN Phone Number: 11/13/2023, 2:20 PM  Clinical Narrative:                  Terry Gray is a 89 y.o. male with medical history significant for prior TIA, hypertension, dyslipidemia, arthritis, DDD, bradycardia, and BPH who presented to the ED with complaints of dizziness and headache.  Pt is from home with his spouse. She is with him all the time.  Pts children provide needed transportation. Pt manages his medications and does admit to not taking his ASA as prescribed. He states he knows he needs to take medications as prescribed after d/c.   Recommendations for [facility] services. Pt provided choice and has no preference. CM has arranged HH with [facility]. Information on the AVS. [facility] will contact him for the first home visit.   IP Care management following.  Expected Discharge Plan: Home w Home Health Services Barriers to Discharge: Continued Medical Work up   Patient Goals and CMS Choice   CMS Medicare.gov Compare Post Acute Care list provided to:: Patient Choice offered to / list presented to : Patient      Expected Discharge Plan and Services   Discharge Planning Services: CM Consult Post Acute Care Choice: Home Health Living arrangements for the past 2 months: Single Family Home                           HH Arranged: PT, OT HH Agency: [facility] Date [facility] Agency Contacted: 11/13/23   Representative spoke with at [facility] Agency: Darleene  Prior Living Arrangements/Services Living arrangements for the past 2 months: Single Family Home Lives with:: Spouse Patient language and need for interpreter reviewed:: Yes Do you feel safe going back to the place where you live?: Yes        Care giver support system in place?: Yes (comment) Current home services: DME (cane/  walker/ rollator/ shower seat) Criminal Activity/Legal Involvement Pertinent to Current Situation/Hospitalization: No - Comment as needed  Activities of Daily Living      Permission Sought/Granted                  Emotional Assessment Appearance:: Appears stated age Attitude/Demeanor/Rapport: Engaged Affect (typically observed): Accepting Orientation: : Oriented to Self, Oriented to Place, Oriented to  Time, Oriented to Situation   Psych Involvement: No (comment)  Admission diagnosis:  Dizziness [R42] CVA (cerebral vascular accident) (HCC) [I63.9] Nausea and vomiting, unspecified vomiting type [R11.2] Patient Active Problem List   Diagnosis Date Noted   CVA (cerebral vascular accident) (HCC) 11/12/2023   Cervical spondylosis with myelopathy and radiculopathy 05/03/2021   Cervical stenosis of spinal canal 04/28/2021   Sinus bradycardia 04/28/2021   Foot drop, bilateral 04/28/2021   DDD (degenerative disc disease), lumbar 04/28/2021   Vertigo 07/05/2013   TIA (transient ischemic attack) 07/05/2013   Chest pain 06/10/2013   Dyspnea 06/10/2013   Essential hypertension 06/10/2013   Elevated lipids 06/10/2013   PCP:  Ransom Other, MD Pharmacy:   [facility] [phone] [address], [place] - [address] AT [facility] [address] [place] [address] Phone: [phone] Fax: [phone]     Social Drivers of Health (SDOH) Social History: SDOH Screenings   Food Insecurity:  No Food Insecurity (11/13/2023)  Housing: Unknown (11/13/2023)  Transportation Needs: No Transportation Needs (11/13/2023)  Utilities: Not At Risk (11/13/2023)  Social Connections: Unknown (11/13/2023)  Tobacco Use: Medium Risk (11/12/2023)   SDOH Interventions: Food Insecurity Interventions: Intervention Not Indicated Housing Interventions: Intervention Not Indicated Transportation Interventions: Intervention Not Indicated Utilities Interventions: Intervention Not Indicated Social  Connections Interventions: Intervention Not Indicated   Readmission Risk Interventions    05/05/2021    1:28 PM  Readmission Risk Prevention Plan  Post Dischage Appt Complete  Medication Screening Complete  Transportation Screening Complete

## 2023-11-13 NOTE — Plan of Care (Signed)

## 2023-11-13 NOTE — Progress Notes (Addendum)
 STROKE TEAM PROGRESS NOTE    INTERIM HISTORY/SUBJECTIVE Family at the bedside. He states yesterday he had an episode of dizziness and speech difficulties.  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  There is a documented history of A-fib in his chart but patient denies this and we were not unable to find definite evidence of A-fib  CBC    Component Value Date/Time   WBC 9.3 11/12/2023 1811   RBC 4.65 11/12/2023 1811   HGB 13.0 11/12/2023 1811   HCT 40.6 11/12/2023 1811   PLT 107 (L) 11/12/2023 1811   MCV 87.3 11/12/2023 1811   MCH 28.0 11/12/2023 1811   MCHC 32.0 11/12/2023 1811   RDW 14.0 11/12/2023 1811   LYMPHSABS 1.9 11/12/2023 1811   MONOABS 0.5 11/12/2023 1811   EOSABS 0.3 11/12/2023 1811   BASOSABS 0.0 11/12/2023 1811    BMET    Component Value Date/Time   NA 140 11/12/2023 1811   K 4.1 11/12/2023 1811   CL 108 11/12/2023 1811   CO2 24 11/12/2023 1811   GLUCOSE 108 (H) 11/12/2023 1811   BUN 21 11/12/2023 1811   CREATININE 0.97 11/12/2023 1811   CALCIUM  8.5 (L) 11/12/2023 1811   GFRNONAA >60 11/12/2023 1811    IMAGING past 24 hours CT ANGIO HEAD NECK W WO CM Result Date: 11/13/2023 CLINICAL DATA:  stroke, vertigo EXAM: CT ANGIOGRAPHY HEAD AND NECK WITH AND WITHOUT CONTRAST TECHNIQUE: Multidetector CT imaging of the head and neck was performed using the standard protocol during bolus administration of intravenous contrast. Multiplanar CT image reconstructions and MIPs were obtained to evaluate the vascular anatomy. Carotid stenosis measurements (when applicable) are obtained utilizing NASCET criteria, using the distal internal carotid diameter as the denominator. RADIATION DOSE REDUCTION: This exam was performed according to the departmental dose-optimization program which includes automated exposure control, adjustment of the mA  and/or kV according to patient size and/or use of iterative reconstruction technique. CONTRAST:  75mL OMNIPAQUE  IOHEXOL  350 MG/ML SOLN COMPARISON:  MRA Jul 05, 2013. FINDINGS: CTA NECK FINDINGS Aortic arch: Aortic atherosclerosis. Great vessel origins are patent. Right carotid system: No evidence of dissection, stenosis (50% or greater), or occlusion. Left carotid system: No evidence of dissection, stenosis (50% or greater), or occlusion. Vertebral arteries: Severe left and mild right vertebral artery origin stenosis. Patent bilaterally. Skeleton: Severe multilevel cervical degenerative change including severe craniocervical degenerative change. Other neck: No acute abnormality on limited assessment. Upper chest: Lung apices are clear. Review of the MIP images confirms the above findings CTA HEAD FINDINGS Anterior circulation: Bilateral intracranial ICAs, MCAs, and ACAs are patent without proximal hemodynamically significant stenosis. Posterior circulation: Bilateral intradural vertebral arteries, basilar artery and bilateral push Fillers are patent without proximal hemodynamically significant stenosis. Venous sinuses: As permitted by contrast timing, patent. Review of the MIP images confirms the above findings IMPRESSION: 1. No emergent large vessel occlusion. 2. Severe left and mild right vertebral artery origin stenosis. 3. Severe multilevel cervical degenerative change including severe craniocervical degenerative change. 4. Aortic Atherosclerosis (ICD10-I70.0). Electronically Signed   By: Gilmore GORMAN Molt M.D.   On: 11/13/2023 00:16   MR BRAIN WO CONTRAST Result Date: 11/12/2023 CLINICAL DATA:  Neuro deficit, acute, stroke suspected EXAM: MRI HEAD WITHOUT CONTRAST TECHNIQUE: Multiplanar, multiecho pulse sequences of the brain and surrounding structures were obtained without intravenous contrast. COMPARISON:  None Available. FINDINGS: Brain: Small acute infarct in the  periventricular left posterior temporal  lobe. No acute hemorrhage, hydrocephalus, extra-axial collection or mass lesion. Small remote right cerebellar infarct and moderate T2/FLAIR hyperintensities in the white matter compatible with chronic microvascular ischemic change. Vascular: Normal flow voids. Skull and upper cervical spine: Normal marrow signal. Sinuses/Orbits: Negative. IMPRESSION: 1. Small acute infarct in the periventricular left posterior temporal lobe. 2. Moderate chronic microvascular ischemic disease. Electronically Signed   By: Gilmore GORMAN Molt M.D.   On: 11/12/2023 22:55   DG Chest Portable 1 View Result Date: 11/12/2023 CLINICAL DATA:  Short of breath, chest pain, dizziness EXAM: PORTABLE CHEST 1 VIEW COMPARISON:  04/28/2021 FINDINGS: Single frontal view of the chest demonstrates an unremarkable cardiac silhouette. No airspace disease, effusion, or pneumothorax. No acute bony abnormality. IMPRESSION: 1. No acute intrathoracic process. Electronically Signed   By: Ozell Daring M.D.   On: 11/12/2023 19:57   CT Head Wo Contrast Result Date: 11/12/2023 CLINICAL DATA:  Neuro deficit, acute, stroke suspected. EXAM: CT HEAD WITHOUT CONTRAST TECHNIQUE: Contiguous axial images were obtained from the base of the skull through the vertex without intravenous contrast. RADIATION DOSE REDUCTION: This exam was performed according to the departmental dose-optimization program which includes automated exposure control, adjustment of the mA and/or kV according to patient size and/or use of iterative reconstruction technique. COMPARISON:  Head CT 04/28/2021 and MRI 07/05/2013 FINDINGS: Brain: There is no evidence of an acute infarct, intracranial hemorrhage, mass, midline shift, or extra-axial fluid collection. Mild cerebral atrophy is within normal limits for age. Cerebral white matter hypodensities are similar to the prior CT and are nonspecific but compatible with mild chronic small vessel ischemic disease. Chronic lacunar infarcts are again noted  in the left thalamus and right cerebellar hemisphere. Vascular: Calcified atherosclerosis at the skull base. No hyperdense vessel. Skull: No fracture or suspicious lesion. Sinuses/Orbits: Paranasal sinuses and mastoid air cells are clear. Bilateral cataract extraction. Other: None. IMPRESSION: 1. No evidence of acute intracranial abnormality. 2. Mild chronic small vessel ischemic disease. Electronically Signed   By: Dasie Hamburg M.D.   On: 11/12/2023 18:38    Vitals:   11/12/23 2315 11/13/23 0010 11/13/23 0329 11/13/23 0903  BP: (!) 144/84 (!) 144/72 (!) 125/59 127/62  Pulse: 69 65 (!) 57 64  Resp: 17 16 16 18   Temp:  (!) 97.4 F (36.3 C) (!) 97.4 F (36.3 C) 98.5 F (36.9 C)  TempSrc:  Oral Oral Oral  SpO2: 99% 96% 97% 96%  Weight:      Height:         PHYSICAL EXAM General:  Alert, well-nourished, well-developed patient in no acute distress Psych:  Mood and affect appropriate for situation CV: Regular rate and rhythm on monitor Respiratory:  Regular, unlabored respirations on room air GI: Abdomen soft and nontender   NEURO:  Mental Status: AA&Ox3, patient is able to give clear and coherent history Speech/Language: speech is without dysarthria or aphasia.  Naming, repetition, fluency, and comprehension intact.  Cranial Nerves:  II: right pupil >left, Right pupil nonreactive.  Visual fields full. Endorses double vision  III, IV, VI: EOMI. Eyelids elevate symmetrically.  V: Sensation is intact to light touch and symmetrical to face.  VII: right facial  VIII: hearing intact to voice. IX, X: Palate elevates symmetrically. Phonation is normal.  KP:Dynloizm shrug 5/5. XII: tongue is midline without fasciculations. Motor: 5/5 strength in left arm and left leg. 4+/5 in right arm and right leg with decreased right hand grip  Tone: is normal and bulk is  normal Sensation- Intact to light touch bilaterally. Extinction absent to light touch to DSS.   Coordination: FTN intact  bilaterally, HKS: no ataxia in BLE.No drift.  Gait- deferred  Most Recent NIH   1a Level of Conscious.:  1b LOC Questions:  1c LOC Commands:  2 Best Gaze:  3 Visual:  4 Facial Palsy: 1 5a Motor Arm - left:  5b Motor Arm - Right: 1 6a Motor Leg - Left:  6b Motor Leg - Right: 1 7 Limb Ataxia:  8 Sensory:  9 Best Language:  10 Dysarthria:  11 Extinct. and Inatten.:  TOTAL: 3   ASSESSMENT/PLAN  Terry Gray is a 89 y.o. male with history of prior stroke/TIA with no residual deficit, prior C-spine surgeries with residual bilateral lower extremity weakness, Hypertension, hyperlipidemia, history of atrial fibrillation not on anticoagulation for unclear reasons, presents for evaluation of worsening dizziness over a couple days and difficulty with not being able to articulate words properly for almost over a week.  He reports that symptoms started somewhat insidiously but over the last 2 days he has been more dizzy-which he describes as lightheadedness and it worsens with movement.   NIH on Admission 3  Acute Ischemic Infarct:  left posterior temporal lobe  Etiology:  small vessel disease   CT head No acute abnormality. Small vessel disease.  CTA head & neck NO LVO. Severe left and mild right vertebral artery origin stenosis.  MRI  Small acute infarct in the periventricular left posterior temporal lobe.  Moderate chronic microvascular ischemic disease 2D Echo ordered  Loop recorder requested at discharge LDL 83 HgbA1c 5.6 VTE prophylaxis - SCD aspirin  81 mg daily prior to admission, now on aspirin  81 mg daily and clopidogrel  75 mg daily for 3 weeks and then Plavix  alone. 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 Atrial fibrillation Home Meds: none  Noted once in ED visit with question A fib on EKG in 2015  Continue telemetry monitoring Recommend loop recorder at discharge    Hypertension Home meds:  none Stable Blood Pressure Goal: SBP less than 160   Hyperlipidemia Home meds:  atorvastatin  10mg , resumed in hospital LDL 83, goal < 70 Increase to 40 mg Continue statin at discharge  Former Tobacco Abuse Noted   Dysphagia Patient has post-stroke dysphagia, SLP consulted    Diet   Diet Heart Room service appropriate? Yes; Fluid consistency: Thin   Advance diet as tolerated  Other Stroke Risk Factors ETOH use, advised to drink no more than 2 drink(s) a day  Other Active Problems GERD Vertigo    Hospital day # 0   Karna Geralds DNP, ACNPC-AG  [facility]  I have personally obtained history,examined this patient, reviewed notes, independently viewed imaging studies, participated in medical decision making and plan of care.ROS completed by me personally and pertinent positives fully documented  I have made any additions or clarifications directly to the above note. Agree with note above.  Patient presented with episode of dizziness and speech difficulties which appear to have resolved and MRI shows small left temporal infarct.  There is a questionable history of A-fib but I do not find definite evidence of that in his chart.  Recommend prolonged cardiac monitoring at discharge and if A-fib is confirmed may switch to anticoagulation with Eliquis  otherwise aspirin  and Plavix  for 3 weeks followed by Plavix  alone.  Continue aggressive risk factor modification and ongoing  stroke workup.  Long discussion with patient and daughter and granddaughter at the bedside and answered questions.  Discussed with Dr. Derryl.   I personally spent a total of 50 minutes in the care of the patient today including getting/reviewing separately obtained history, performing a medically appropriate exam/evaluation, counseling and educating, placing orders, referring and communicating with other health care professionals, documenting clinical information in the EHR,  independently interpreting results, and coordinating care.        Eather Popp, MD Medical Director [facility] Pager: [phone] 11/13/2023 4:15 PM    To contact Stroke Continuity provider, please refer to [email or web address]. After hours, contact General Neurology

## 2023-11-13 NOTE — Plan of Care (Signed)
  Problem: Education: Goal: Knowledge of General Education information will improve Description: Including pain rating scale, medication(s)/side effects and non-pharmacologic comfort measures 11/13/2023 0434 by Pearly Doffing, RN Outcome: Progressing 11/13/2023 0050 by Pearly Doffing, RN Outcome: Progressing   Problem: Health Behavior/Discharge Planning: Goal: Ability to manage health-related needs will improve 11/13/2023 0434 by Pearly Doffing, RN Outcome: Progressing 11/13/2023 0050 by Pearly Doffing, RN Outcome: Progressing   Problem: Clinical Measurements: Goal: Ability to maintain clinical measurements within normal limits will improve 11/13/2023 0434 by Pearly Doffing, RN Outcome: Progressing 11/13/2023 0050 by Pearly Doffing, RN Outcome: Progressing Goal: Will remain free from infection 11/13/2023 0434 by Pearly Doffing, RN Outcome: Progressing 11/13/2023 0050 by Pearly Doffing, RN Outcome: Progressing Goal: Diagnostic test results will improve 11/13/2023 0434 by Pearly Doffing, RN Outcome: Progressing 11/13/2023 0050 by Pearly Doffing, RN Outcome: Progressing Goal: Respiratory complications will improve 11/13/2023 0434 by Pearly Doffing, RN Outcome: Progressing 11/13/2023 0050 by Pearly Doffing, RN Outcome: Progressing Goal: Cardiovascular complication will be avoided 11/13/2023 0434 by Pearly Doffing, RN Outcome: Progressing 11/13/2023 0050 by Pearly Doffing, RN Outcome: Progressing   Problem: Nutrition: Goal: Adequate nutrition will be maintained 11/13/2023 0434 by Pearly Doffing, RN Outcome: Progressing 11/13/2023 0050 by Pearly Doffing, RN Outcome: Progressing   Problem: Activity: Goal: Risk for activity intolerance will decrease 11/13/2023 0434 by Pearly Doffing, RN Outcome: Progressing 11/13/2023 0050 by Pearly Doffing, RN Outcome: Progressing   Problem: Coping: Goal: Level of anxiety will decrease 11/13/2023 0434 by Pearly Doffing, RN Outcome: Progressing 11/13/2023 0050 by Pearly Doffing,  RN Outcome: Progressing   Problem: Pain Managment: Goal: General experience of comfort will improve and/or be controlled 11/13/2023 0434 by Pearly Doffing, RN Outcome: Progressing 11/13/2023 0050 by Pearly Doffing, RN Outcome: Progressing   Problem: Safety: Goal: Ability to remain free from injury will improve 11/13/2023 0434 by Pearly Doffing, RN Outcome: Progressing 11/13/2023 0050 by Pearly Doffing, RN Outcome: Progressing   Problem: Skin Integrity: Goal: Risk for impaired skin integrity will decrease 11/13/2023 0434 by Pearly Doffing, RN Outcome: Progressing 11/13/2023 0050 by Pearly Doffing, RN Outcome: Progressing   Problem: Education: Goal: Knowledge of disease or condition will improve Outcome: Progressing Goal: Knowledge of secondary prevention will improve (MUST DOCUMENT ALL) Outcome: Progressing Goal: Knowledge of patient specific risk factors will improve (DELETE if not current risk factor) Outcome: Progressing   Problem: Ischemic Stroke/TIA Tissue Perfusion: Goal: Complications of ischemic stroke/TIA will be minimized Outcome: Progressing   Problem: Health Behavior/Discharge Planning: Goal: Ability to manage health-related needs will improve Outcome: Progressing Goal: Goals will be collaboratively established with patient/family Outcome: Progressing   Problem: Self-Care: Goal: Ability to participate in self-care as condition permits will improve Outcome: Progressing Goal: Verbalization of feelings and concerns over difficulty with self-care will improve Outcome: Progressing Goal: Ability to communicate needs accurately will improve Outcome: Progressing   Problem: Nutrition: Goal: Risk of aspiration will decrease Outcome: Progressing Goal: Dietary intake will improve Outcome: Progressing

## 2023-11-13 NOTE — Evaluation (Signed)
 Occupational Therapy Evaluation Patient Details Name: Terry Gray MRN: [id number] DOB: [birth year] Today's Date: 11/13/2023   History of Present Illness   Ptis a 89 y/o male presenting on 9/7 with dizziness. MRI with small infarct in L periventricular region temporal lobe.  PMH includes HTN, HLD, DOE, GERD, CVA, afib, ACDF.     Clinical Impressions PTA pt reports using RW for transfer and limited mobility, managing ADLs with supervision to modified independence.  Admitted for above and presents with problem list below. Pt denies dizziness during session, but has decreased visual attention with tracking. He follows commands with increased time, but demonstrates decreased attention and problem solving. He is mobilizing with contact guard assist using RW, setup to min assist for ADLs. Will assess cognition and vision further as needed. Based on performance today, pt will best benefit from continued OT services acutely and after dc at [facility] level to optimize independence and safety with ADLs, IADLs and mobility.     If plan is discharge home, recommend the following:   A little help with walking and/or transfers;A little help with bathing/dressing/bathroom;Assistance with cooking/housework;Assist for transportation;Help with stairs or ramp for entrance     Functional Status Assessment   Patient has had a recent decline in their functional status and demonstrates the ability to make significant improvements in function in a reasonable and predictable amount of time.     Equipment Recommendations   Tub/shower bench     Recommendations for Other Services         Precautions/Restrictions   Precautions Precautions: Fall Recall of Precautions/Restrictions: Intact Restrictions Weight Bearing Restrictions Per Provider Order: No     Mobility Bed Mobility Overal bed mobility: Needs Assistance Bed Mobility: Supine to Sit     Supine to sit: Supervision           Transfers Overall transfer level: Needs assistance Equipment used: Rolling walker (2 wheels) Transfers: Sit to/from Stand Sit to Stand: Contact guard assist           General transfer comment: for safety and balance      Balance Overall balance assessment: Needs assistance Sitting-balance support: No upper extremity supported, Feet supported Sitting balance-Leahy Scale: Fair     Standing balance support: During functional activity, Bilateral upper extremity supported, Reliant on assistive device for balance Standing balance-Leahy Scale: Poor Standing balance comment: relies on RW, leans on sink with elbows to support self.  needs UE support                           ADL either performed or assessed with clinical judgement   ADL Overall ADL's : Needs assistance/impaired     Grooming: Contact guard assist;Wash/dry hands;Standing           Upper Body Dressing : Set up;Sitting   Lower Body Dressing: Minimal assistance;Sit to/from stand   Toilet Transfer: Contact guard assist;Ambulation;Rolling walker (2 wheels)           Functional mobility during ADLs: Contact guard assist;Rolling walker (2 wheels);Cueing for safety       Vision Baseline Vision/History: 1 Wears glasses Ability to See in Adequate Light: 0 Adequate Patient Visual Report: No change from baseline (reports diplopia recently but not currently) Vision Assessment?: Yes Eye Alignment: Within Functional Limits Ocular Range of Motion: Within Functional Limits Alignment/Gaze Preference: Within Defined Limits Tracking/Visual Pursuits: Impaired - to be further tested in functional context (decreased smoothness of tracking, eye shifts back to midline)  Visual Fields: No apparent deficits;Other (comment) (but does shift gaze frequently) Additional Comments: good depth perception, able to read clock and numbers on wall.  He reports diplopia when looking up at TV earlier but not now.  Some  decreased visual attention. denies dizziness. WIll continue to assess.     Perception         Praxis         Pertinent Vitals/Pain Pain Assessment Pain Assessment: No/denies pain     Extremity/Trunk Assessment Upper Extremity Assessment Upper Extremity Assessment: Generalized weakness   Lower Extremity Assessment Lower Extremity Assessment: Defer to PT evaluation       Communication Communication Communication: No apparent difficulties   Cognition Arousal: Alert Behavior During Therapy: WFL for tasks assessed/performed Cognition: Cognition impaired     Awareness: Online awareness impaired   Attention impairment (select first level of impairment): Sustained attention Executive functioning impairment (select all impairments): Problem solving OT - Cognition Comments: pt with decreased attention requiring redireciton, decreased problem sovling                 Following commands: Intact       Cueing  General Comments   Cueing Techniques: Verbal cues  daughter and spouse at side   Exercises     Shoulder Instructions      Home Living Family/patient expects to be discharged to:: Private residence Living Arrangements: Spouse/significant other;Other (Comment) (daughter) Available Help at Discharge: Family;Available 24 hours/day Type of Home: House Home Access: Stairs to enter [employer] of Steps: 6 Entrance Stairs-Rails: Right;Left Home Layout: One level     Bathroom Shower/Tub: Chief Strategy Officer: Standard     Home Equipment: Agricultural consultant (2 wheels);Rollator (4 wheels);BSC/3in1;Other (comment) (urinal)          Prior Functioning/Environment Prior Level of Function : Needs assist             Mobility Comments: reports walking with RW ADLs Comments: ind ADLs (supervision for bathing), manages his and his spouses medications    OT Problem List: Decreased strength;Decreased activity tolerance;Impaired balance  (sitting and/or standing);Decreased knowledge of precautions;Decreased knowledge of use of DME or AE;Decreased cognition   OT Treatment/Interventions: Self-care/ADL training;Therapeutic exercise;DME and/or AE instruction;Therapeutic activities;Cognitive remediation/compensation;Patient/family education;Balance training;Energy conservation;Visual/perceptual remediation/compensation      OT Goals(Current goals can be found in the care plan section)   Acute Rehab OT Goals Patient Stated Goal: home OT Goal Formulation: With patient Time For Goal Achievement: 11/27/23 Potential to Achieve Goals: Good   OT Frequency:  Min 2X/week    Co-evaluation PT/OT/SLP Co-Evaluation/Treatment: Yes Reason for Co-Treatment: To address functional/ADL transfers;For patient/therapist safety          AM-PAC OT 6 Clicks Daily Activity     Outcome Measure Help from another person eating meals?: A Little Help from another person taking care of personal grooming?: A Little Help from another person toileting, which includes using toliet, bedpan, or urinal?: A Little Help from another person bathing (including washing, rinsing, drying)?: A Little Help from another person to put on and taking off regular upper body clothing?: A Little Help from another person to put on and taking off regular lower body clothing?: A Little 6 Click Score: 18   End of Session Equipment Utilized During Treatment: Gait belt;Rolling walker (2 wheels) Nurse Communication: Mobility status;Precautions  Activity Tolerance: Patient tolerated treatment well Patient left: in chair;with call bell/phone within reach;with chair alarm set;with family/visitor present  OT Visit Diagnosis: Other abnormalities of gait  and mobility (R26.89);Muscle weakness (generalized) (M62.81);Other symptoms and signs involving the nervous system (R29.898)                Time: 8790-8751 OT Time Calculation (min): 39 min Charges:  OT General Charges $OT  Visit: 1 Visit OT Evaluation $OT Eval Moderate Complexity: 1 Mod  Etta NOVAK, OT Acute Rehabilitation Services Office [phone] Secure Chat Preferred    Etta GORMAN Hope 11/13/2023, 1:08 PM

## 2023-11-13 NOTE — Care Management Important Message (Signed)
 Important Message  Patient Details  Name: Terry Gray MRN: [id number] Date of Birth: 1933/05/13   Important Message Given:  Yes - Medicare IM     Claretta Deed 11/13/2023, 9:49 AM

## 2023-11-13 NOTE — Evaluation (Addendum)
 Physical Therapy Evaluation Patient Details Name: Terry Gray MRN: [id number] DOB: [birth year] Today's Date: 11/13/2023  History of Present Illness  Ptis a 89 y/o male presenting on 9/7 with dizziness. MRI with small infarct in L periventricular region temporal lobe.  PMH includes HTN, HLD, DOE, GERD, CVA, afib, ACDF.  Clinical Impression  PTA, pt lives with his family, uses a Rollator vs RW for limited household ambulation and manages ADL's with supervision-modI. Pt seems fairly close to his functional baseline. Pt denies dizziness during session, but does have decreased attention and problem solving. Pt requiring close CGA for transfers and bedroom ambulation using RW. He has chronic bilateral foot drop but does not have AFO's present. Recommend follow up HHPT to address deficits and maximize functional mobility.      If plan is discharge home, recommend the following: A little help with walking and/or transfers;A little help with bathing/dressing/bathroom;Assistance with cooking/housework;Assist for transportation;Help with stairs or ramp for entrance;Supervision due to cognitive status   Can travel by private vehicle        Equipment Recommendations None recommended by PT  Recommendations for Other Services       Functional Status Assessment Patient has had a recent decline in their functional status and demonstrates the ability to make significant improvements in function in a reasonable and predictable amount of time.     Precautions / Restrictions Precautions Precautions: Fall Recall of Precautions/Restrictions: Intact Restrictions Weight Bearing Restrictions Per Provider Order: No      Mobility  Bed Mobility Overal bed mobility: Needs Assistance Bed Mobility: Supine to Sit     Supine to sit: Supervision          Transfers Overall transfer level: Needs assistance Equipment used: Rolling walker (2 wheels) Transfers: Sit to/from Stand Sit to Stand: Contact  guard assist           General transfer comment: for safety and balance, pt pushes off of bottom rung of walker at baseline    Ambulation/Gait Ambulation/Gait assistance: Contact guard assist Gait Distance (Feet): 30 Feet Assistive device: Rolling walker (2 wheels) Gait Pattern/deviations: Step-through pattern, Decreased stride length, Decreased dorsiflexion - right, Decreased dorsiflexion - left Gait velocity: decreased Gait velocity interpretation: <1.8 ft/sec, indicate of risk for recurrent falls   General Gait Details: Close CGA, decreased bilateral heel strike due to baseline foot drop. Heavy reliance through arms on walker. Modest instability but no overt LOB  Stairs            Wheelchair Mobility     Tilt Bed    Modified Rankin (Stroke Patients Only)       Balance Overall balance assessment: Needs assistance Sitting-balance support: No upper extremity supported, Feet supported Sitting balance-Leahy Scale: Fair     Standing balance support: During functional activity, Bilateral upper extremity supported, Reliant on assistive device for balance Standing balance-Leahy Scale: Poor Standing balance comment: relies on RW, leans on sink with elbows to support self.  needs UE support                             Pertinent Vitals/Pain Pain Assessment Pain Assessment: No/denies pain    Home Living Family/patient expects to be discharged to:: Private residence Living Arrangements: Spouse/significant other;Other (Comment) (daughter) Available Help at Discharge: Family;Available 24 hours/day Type of Home: House Home Access: Stairs to enter Entrance Stairs-Rails: Doctor, general practice of Steps: 6   Home Layout: One level Home Equipment: Agricultural consultant (  2 wheels);Rollator (4 wheels);BSC/3in1;Other (comment) (urinal, bilateral AFO's)      Prior Function Prior Level of Function : Needs assist             Mobility Comments: reports  walking with RW ADLs Comments: ind ADLs (supervision for bathing), manages his and his spouses medications     Extremity/Trunk Assessment   Upper Extremity Assessment Upper Extremity Assessment: Generalized weakness    Lower Extremity Assessment Lower Extremity Assessment: Generalized weakness;RLE deficits/detail;LLE deficits/detail RLE Deficits / Details: Foot drop at baseline LLE Deficits / Details: Foot drop at baseline       Communication   Communication Communication: No apparent difficulties    Cognition Arousal: Alert Behavior During Therapy: WFL for tasks assessed/performed   PT - Cognitive impairments: Awareness, Attention                       PT - Cognition Comments: Requires redirection to task Following commands: Intact       Cueing Cueing Techniques: Verbal cues     General Comments      Exercises     Assessment/Plan    PT Assessment Patient needs continued PT services  PT Problem List Decreased strength;Decreased activity tolerance;Decreased balance;Decreased mobility;Decreased safety awareness       PT Treatment Interventions DME instruction;Gait training;Stair training;Functional mobility training;Therapeutic activities;Therapeutic exercise;Balance training;Patient/family education    PT Goals (Current goals can be found in the Care Plan section)  Acute Rehab PT Goals Patient Stated Goal: did not state PT Goal Formulation: With patient Time For Goal Achievement: 11/27/23 Potential to Achieve Goals: Good    Frequency Min 2X/week     Co-evaluation PT/OT/SLP Co-Evaluation/Treatment: Yes Reason for Co-Treatment: To address functional/ADL transfers;For patient/therapist safety PT goals addressed during session: Mobility/safety with mobility         AM-PAC PT 6 Clicks Mobility  Outcome Measure Help needed turning from your back to your side while in a flat bed without using bedrails?: A Little Help needed moving from lying  on your back to sitting on the side of a flat bed without using bedrails?: A Little Help needed moving to and from a bed to a chair (including a wheelchair)?: A Little Help needed standing up from a chair using your arms (e.g., wheelchair or bedside chair)?: A Little Help needed to walk in hospital room?: A Little Help needed climbing 3-5 steps with a railing? : A Lot 6 Click Score: 17    End of Session Equipment Utilized During Treatment: Gait belt Activity Tolerance: Patient tolerated treatment well Patient left: in chair;with call bell/phone within reach;with chair alarm set;with family/visitor present Nurse Communication: Mobility status PT Visit Diagnosis: Unsteadiness on feet (R26.81);Muscle weakness (generalized) (M62.81);Difficulty in walking, not elsewhere classified (R26.2)    Time: 8790-8751 PT Time Calculation (min) (ACUTE ONLY): 39 min   Charges:   PT Evaluation $PT Eval Moderate Complexity: 1 Mod   PT General Charges $$ ACUTE PT VISIT: 1 Visit         Aleck Daring, PT, DPT Acute Rehabilitation Services Office [phone]   Aleck ONEIDA Daring 11/13/2023, 4:03 PM

## 2023-11-13 NOTE — Progress Notes (Signed)
 Patient seen and examined at the bedside.  He is admitted for left temporal lobe CVA.     Patient does not report of dizziness today.  He denies any vision issues.   He is initiated on aspirin  and Plavix  in the ER.  Reportedly takes aspirin  at home but does not take regularly. He has remote history of questionable A-fib. Neurology has recommended loop recorder and have consulted EP. Discharge plan is home with home health service.

## 2023-11-13 NOTE — Progress Notes (Signed)
 Echocardiogram 2D Echocardiogram has been performed.  Terry Gray 11/13/2023, 4:16 PM

## 2023-11-13 NOTE — Consult Note (Signed)
 ELECTROPHYSIOLOGY CONSULT NOTE  Patient ID: Terry Gray MRN: [id number], DOB/AGE: [birth year]   Admit date: 11/12/2023 Date of Consult: 11/14/2023  Primary Physician: Ransom Other, MD Primary Cardiologist: None  Primary Electrophysiologist: New to None  Reason for Consultation: Cryptogenic stroke; recommendations regarding Implantable Loop Recorder Insurance: [employer]  History of Present Illness EP has been asked to evaluate Terry Gray for placement of an implantable loop recorder to monitor for atrial fibrillation by Dr Voncile.  He has PMH of prior CVA/TIA, cervical spine surgeries, lower extremity weakness, HTN, HLD, remote afib not on OAC.  The patient was admitted on 11/12/2023 with worsening dizziness and word-finding difficulty x 1 week. MRI brain reveals small infarct in periventricular region of L temporal lobe   The pt has undergone workup for stroke including:   CT head No acute abnormality. Small vessel disease.  CTA head & neck NO LVO. Severe left and mild right vertebral artery origin stenosis.  MRI  Small acute infarct in the periventricular left posterior temporal lobe.  Moderate chronic microvascular ischemic disease 2D Echo  LDL 83 HgbA1c 5.6 VTE prophylaxis - SCD aspirin  81 mg daily prior to admission, now on aspirin  81 mg daily and clopidogrel  75 mg daily for 3 weeks and then Plavix  alone. Therapy recommendations:  Pending   The patient has been monitored on telemetry which has demonstrated sinus rhythm with no arrhythmias.  Inpatient stroke work-up will not require a TEE per Neurology.   Echocardiogram as above. Lab work is reviewed.  Prior to admission, the patient denies chest pain, shortness of breath, dizziness, palpitations, or syncope.  He is recovering from his stroke with plans to return home  at discharge.  Allergies, Past Medical, Surgical, Social, and Family Histories have been reviewed and are referenced here-in when relevant for  medical decision making.   Inpatient Medications:    stroke: early stages of recovery book   Does not apply Once   aspirin  EC  81 mg Oral Daily   atorvastatin   40 mg Oral QPM   clopidogrel   75 mg Oral Daily   docusate sodium   100 mg Oral Daily   finasteride   5 mg Oral Daily   polyethylene glycol  17 g Oral Daily   ramipril   10 mg Oral q morning    Physical Exam: Vitals:   11/13/23 2040 11/13/23 2345 11/14/23 0340 11/14/23 0737  BP: 130/63 128/76 137/66 (!) 140/71  Pulse: 67 (!) 56 (!) 52 (!) 52  Resp: 18 18  18   Temp: 98.5 F (36.9 C) 98.5 F (36.9 C) (!) 97.5 F (36.4 C) 97.8 F (36.6 C)  TempSrc: Oral Oral Oral Oral  SpO2: 99% 97% 92% 95%  Weight:      Height:        GEN- NAD. A&O x 3. Normal affect. HEENT: Normocephalic, atraumatic Lungs- CTAB, Normal effort.  Heart- Regular rate and rhythm rate and rhythm. No M/G/R.  Extremities- No peripheral edema. no clubbing or cyanosis Skin- warm and dry, no rash or lesion. Neuro alert and oriented   12-lead ECG  (personally reviewed) All prior EKG's in EPIC reviewed 07/2013 EKG with AFib  Telemetry SR with rare PVC (personally reviewed)  Assessment and Plan:  #) Recurrent stroke #) remote AFib The patient presents with stroke.  Given that he has remote history of AFib (2015), would recommend initiating NOAC for stroke ppx, when appropriate from neuro perspective Discussed with neurology    Please call with questions.  Terry Paulding, NP 11/14/2023 9:54 AM

## 2023-11-13 NOTE — Care Management Obs Status (Signed)
 MEDICARE OBSERVATION STATUS NOTIFICATION   Patient Details  Name: Terry Gray MRN: [id number] Date of Birth: 1933-07-06   Medicare Observation Status Notification Given:  Yes Obs notice signed and copy provided   Claretta Deed 11/13/2023, 4:25 PM

## 2023-11-14 ENCOUNTER — Other Ambulatory Visit (HOSPITAL_COMMUNITY): Payer: Self-pay

## 2023-11-14 ENCOUNTER — Telehealth (HOSPITAL_COMMUNITY): Payer: Self-pay | Admitting: Pharmacy Technician

## 2023-11-14 DIAGNOSIS — Z8679 Personal history of other diseases of the circulatory system: Secondary | ICD-10-CM | POA: Diagnosis not present

## 2023-11-14 DIAGNOSIS — Z7901 Long term (current) use of anticoagulants: Secondary | ICD-10-CM

## 2023-11-14 DIAGNOSIS — I4891 Unspecified atrial fibrillation: Secondary | ICD-10-CM | POA: Diagnosis not present

## 2023-11-14 DIAGNOSIS — E785 Hyperlipidemia, unspecified: Secondary | ICD-10-CM | POA: Diagnosis not present

## 2023-11-14 DIAGNOSIS — I6350 Cerebral infarction due to unspecified occlusion or stenosis of unspecified cerebral artery: Secondary | ICD-10-CM | POA: Diagnosis not present

## 2023-11-14 DIAGNOSIS — R29703 NIHSS score 3: Secondary | ICD-10-CM | POA: Diagnosis not present

## 2023-11-14 DIAGNOSIS — I739 Peripheral vascular disease, unspecified: Secondary | ICD-10-CM | POA: Diagnosis not present

## 2023-11-14 DIAGNOSIS — I639 Cerebral infarction, unspecified: Secondary | ICD-10-CM | POA: Diagnosis not present

## 2023-11-14 DIAGNOSIS — I69391 Dysphagia following cerebral infarction: Secondary | ICD-10-CM | POA: Diagnosis not present

## 2023-11-14 DIAGNOSIS — Z87891 Personal history of nicotine dependence: Secondary | ICD-10-CM | POA: Diagnosis not present

## 2023-11-14 MED ORDER — ATORVASTATIN CALCIUM 40 MG PO TABS
40.0000 mg | ORAL_TABLET | Freq: Every evening | ORAL | 0 refills | Status: AC
  Filled 2023-11-14: qty 30, 30d supply, fill #0

## 2023-11-14 MED ORDER — APIXABAN 5 MG PO TABS
5.0000 mg | ORAL_TABLET | Freq: Two times a day (BID) | ORAL | 2 refills | Status: AC
  Filled 2023-11-14: qty 60, 30d supply, fill #0

## 2023-11-14 NOTE — Progress Notes (Signed)
 Pt is ready for DC. Will be leaving with family. All lines removed and DC paperwork reviewed, along with medication education.

## 2023-11-14 NOTE — Discharge Instructions (Signed)

## 2023-11-14 NOTE — Plan of Care (Signed)

## 2023-11-14 NOTE — Progress Notes (Signed)
 Occupational Therapy Treatment Patient Details Name: Terry Gray MRN: [id number] DOB: September 17, 1933 Today's Date: 11/14/2023   History of present illness Ptis a 89 y/o male presenting on 9/7 with dizziness. MRI with small infarct in L periventricular region temporal lobe.  PMH includes HTN, HLD, DOE, GERD, CVA, afib, ACDF.   OT comments  Functional cognition further assessed with The Pillbox Test: A Measure of Executive Functioning and Estimate of Medication Management. A straight pass/fail designation is determined by 3 or more errors of omission or misplacement on the task. The pt completed the test with less than 3 errors and passed the assessment. Pt completed test with 1 error, increased time.  Has difficulty with attention and working memory during task.  Discussed non distracting environment with IADL engagement, having assist as needed at dc- pt agreeable to recommendations.  Will follow acutely, continue to recommend HHOT services at dc.         If plan is discharge home, recommend the following:  A little help with walking and/or transfers;A little help with bathing/dressing/bathroom;Assistance with cooking/housework;Assist for transportation;Help with stairs or ramp for entrance   Equipment Recommendations  Tub/shower bench    Recommendations for Other Services      Precautions / Restrictions Precautions Precautions: Fall Recall of Precautions/Restrictions: Intact Restrictions Weight Bearing Restrictions Per Provider Order: No       Mobility Bed Mobility                    Transfers                         Balance                                           ADL either performed or assessed with clinical judgement   ADL                                              Extremity/Trunk Assessment              Vision       Perception     Praxis     Communication Communication Communication: No  apparent difficulties   Cognition Arousal: Alert Behavior During Therapy: WFL for tasks assessed/performed Cognition: Cognition impaired       Memory impairment (select all impairments): Working memory Attention impairment (select first level of impairment): Alternating attention   OT - Cognition Comments: completed pill box test. pt passed test, no errors but it took him 12 minutes (when test allows 5 minutes).  He reports diffculty with sustained attention and working memory when reading the label and then trying to place medication in pill box, requiring him to re-read labels mutliple times.                 Following commands: Intact        Cueing   Cueing Techniques: Verbal cues  Exercises      Shoulder Instructions       General Comments discussed completing IADLs in quiet, non distracting enviornment and having assist as needed, pt agreeable    Pertinent Vitals/ Pain       Pain Assessment Pain Assessment: No/denies pain  Home Living Family/patient  expects to be discharged to:: Private residence Living Arrangements: Spouse/significant other;Other (Comment);Children                                      Prior Functioning/Environment              Frequency  Min 2X/week        Progress Toward Goals  OT Goals(current goals can now be found in the care plan section)  Progress towards OT goals: Progressing toward goals  Acute Rehab OT Goals Patient Stated Goal: home OT Goal Formulation: With patient Time For Goal Achievement: 11/27/23 Potential to Achieve Goals: Good  Plan      Co-evaluation                 AM-PAC OT 6 Clicks Daily Activity     Outcome Measure   Help from another person eating meals?: A Little Help from another person taking care of personal grooming?: A Little Help from another person toileting, which includes using toliet, bedpan, or urinal?: A Little Help from another person bathing (including  washing, rinsing, drying)?: A Little Help from another person to put on and taking off regular upper body clothing?: A Little Help from another person to put on and taking off regular lower body clothing?: A Little 6 Click Score: 18    End of Session    OT Visit Diagnosis: Other abnormalities of gait and mobility (R26.89);Muscle weakness (generalized) (M62.81);Other symptoms and signs involving the nervous system (R29.898)   Activity Tolerance Patient tolerated treatment well   Patient Left in bed;with call bell/phone within reach;with family/visitor present;with bed alarm set   Nurse Communication Mobility status        Time: 8875-8850 OT Time Calculation (min): 25 min  Charges: OT General Charges $OT Visit: 1 Visit OT Treatments $Self Care/Home Management : 23-37 mins  Terry Gray, OT Acute Rehabilitation Services Office [phone] Secure Chat Preferred    Terry Gray 11/14/2023, 1:23 PM

## 2023-11-14 NOTE — Progress Notes (Signed)
 STROKE TEAM PROGRESS NOTE    INTERIM HISTORY/SUBJECTIVE Family at the bedside. He states he is doing well.  Wants to go home.  Appreciate EP team help which has reviewed EKG from 07/06/2023 which confirms A-fib.  Plan to change patient to Eliquis  and stop antiplatelet therapy. CBC    Component Value Date/Time   WBC 9.3 11/12/2023 1811   RBC 4.65 11/12/2023 1811   HGB 13.0 11/12/2023 1811   HCT 40.6 11/12/2023 1811   PLT 107 (L) 11/12/2023 1811   MCV 87.3 11/12/2023 1811   MCH 28.0 11/12/2023 1811   MCHC 32.0 11/12/2023 1811   RDW 14.0 11/12/2023 1811   LYMPHSABS 1.9 11/12/2023 1811   MONOABS 0.5 11/12/2023 1811   EOSABS 0.3 11/12/2023 1811   BASOSABS 0.0 11/12/2023 1811    BMET    Component Value Date/Time   NA 140 11/12/2023 1811   K 4.1 11/12/2023 1811   CL 108 11/12/2023 1811   CO2 24 11/12/2023 1811   GLUCOSE 108 (H) 11/12/2023 1811   BUN 21 11/12/2023 1811   CREATININE 0.97 11/12/2023 1811   CALCIUM  8.5 (L) 11/12/2023 1811   GFRNONAA >60 11/12/2023 1811    IMAGING past 24 hours ECHOCARDIOGRAM COMPLETE Result Date: 11/13/2023    ECHOCARDIOGRAM REPORT   Patient Name:   Terry Gray [facility] Date of Exam: 11/13/2023 Medical Rec #:  [id number]       Height:       58.0 in Accession #:    [id number]      Weight:       140.0 lb Date of Birth:  1933-07-29      BSA:          1.565 m Patient Age:    89 years        BP:           125/59 mmHg Patient Gender: M               HR:           63 bpm. Exam Location:  Inpatient Procedure: 2D Echo, Cardiac Doppler and Color Doppler (Both Spectral and Color            Flow Doppler were utilized during procedure). Indications:    Stroke I63.9  History:        Patient has prior history of Echocardiogram examinations, most                 recent 07/06/2013. Stroke and TIA, Arrythmias:Bradycardia,                 Signs/Symptoms:Chest Pain and Dyspnea; Risk                 Factors:Hypertension.  Sonographer:    Thea Norlander RCS Referring Phys: [id number]  PRATIK D [facility] IMPRESSIONS  1. Left ventricular ejection fraction, by estimation, is 60 to 65%. The left ventricle has normal function. The left ventricle has no regional wall motion abnormalities. There is mild asymmetric left ventricular hypertrophy of the basal and septal segments. Left ventricular diastolic parameters were normal.  2. Right ventricular systolic function is mildly reduced. The right ventricular size is mildly enlarged.  3. Left atrial size was mildly dilated.  4. The mitral valve is abnormal. Mild mitral valve regurgitation. No evidence of mitral stenosis. Moderate mitral annular calcification.  5. The aortic valve is tricuspid. There is moderate calcification of the aortic valve. There is moderate thickening of the aortic valve. Aortic valve regurgitation is not  visualized. Aortic valve sclerosis is present, with no evidence of aortic valve stenosis.  6. Aortic dilatation noted. There is mild dilatation of the ascending aorta, measuring 39 mm.  7. The inferior vena cava is normal in size with greater than 50% respiratory variability, suggesting right atrial pressure of 3 mmHg. FINDINGS  Left Ventricle: Left ventricular ejection fraction, by estimation, is 60 to 65%. The left ventricle has normal function. The left ventricle has no regional wall motion abnormalities. Strain was performed and the global longitudinal strain is indeterminate. The left ventricular internal cavity size was normal in size. There is mild asymmetric left ventricular hypertrophy of the basal and septal segments. Left ventricular diastolic parameters were normal. Right Ventricle: The right ventricular size is mildly enlarged. No increase in right ventricular wall thickness. Right ventricular systolic function is mildly reduced. Left Atrium: Left atrial size was mildly dilated. Right Atrium: Right atrial size was normal in size. Pericardium: There is no evidence of pericardial effusion. Mitral Valve: The mitral valve is  abnormal. There is mild thickening of the mitral valve leaflet(s). There is mild calcification of the mitral valve leaflet(s). Moderate mitral annular calcification. Mild mitral valve regurgitation. No evidence of mitral  valve stenosis. Tricuspid Valve: The tricuspid valve is normal in structure. Tricuspid valve regurgitation is mild . No evidence of tricuspid stenosis. Aortic Valve: The aortic valve is tricuspid. There is moderate calcification of the aortic valve. There is moderate thickening of the aortic valve. Aortic valve regurgitation is not visualized. Aortic valve sclerosis is present, with no evidence of aortic valve stenosis. Aortic valve peak gradient measures 6.1 mmHg. Pulmonic Valve: The pulmonic valve was normal in structure. Pulmonic valve regurgitation is not visualized. No evidence of pulmonic stenosis. Aorta: Aortic dilatation noted. There is mild dilatation of the ascending aorta, measuring 39 mm. Venous: The inferior vena cava is normal in size with greater than 50% respiratory variability, suggesting right atrial pressure of 3 mmHg. IAS/Shunts: No atrial level shunt detected by color flow Doppler. Additional Comments: 3D was performed not requiring image post processing on an independent workstation and was indeterminate.  LEFT VENTRICLE PLAX 2D LVIDd:         3.80 cm   Diastology LVIDs:         2.30 cm   LV e' medial:    9.68 cm/s LV PW:         1.00 cm   LV E/e' medial:  12.7 LV IVS:        1.20 cm   LV e' lateral:   12.90 cm/s LVOT diam:     2.20 cm   LV E/e' lateral: 9.5 LV SV:         75 LV SV Index:   48 LVOT Area:     3.80 cm  RIGHT VENTRICLE RV S prime:     13.60 cm/s TAPSE (M-mode): 2.3 cm LEFT ATRIUM             Index        RIGHT ATRIUM           Index LA diam:        3.80 cm 2.43 cm/m   RA Area:     13.60 cm LA Vol (A2C):   35.0 ml 22.33 ml/m  RA Volume:   29.60 ml  18.91 ml/m LA Vol (A4C):   37.3 ml 23.83 ml/m LA Biplane Vol: 37.2 ml 23.77 ml/m  AORTIC VALVE AV Area (Vmax):  2.67 cm AV Vmax:  123.00 cm/s AV Peak Grad:   6.1 mmHg LVOT Vmax:      86.50 cm/s LVOT Vmean:     56.100 cm/s LVOT VTI:       0.197 m  AORTA Ao Root diam: 3.40 cm Ao Asc diam:  3.90 cm MITRAL VALVE MV Area (PHT): 2.76 cm     SHUNTS MV Decel Time: 275 msec     Systemic VTI:  0.20 m MV E velocity: 123.00 cm/s  Systemic Diam: 2.20 cm MV A velocity: 134.00 cm/s MV E/A ratio:  0.92 Maude Emmer MD Electronically signed by Maude Emmer MD Signature Date/Time: 11/13/2023/4:47:25 PM    Final     Vitals:   11/13/23 2345 11/14/23 0340 11/14/23 0737 11/14/23 1100  BP: 128/76 137/66 (!) 140/71 (!) 150/78  Pulse: (!) 56 (!) 52 (!) 52 70  Resp: 18  18   Temp: 98.5 F (36.9 C) (!) 97.5 F (36.4 C) 97.8 F (36.6 C) 98.6 F (37 C)  TempSrc: Oral Oral Oral Oral  SpO2: 97% 92% 95% 98%  Weight:      Height:         PHYSICAL EXAM General:  Alert, well-nourished, well-developed patient in no acute distress Psych:  Mood and affect appropriate for situation CV: Regular rate and rhythm on monitor Respiratory:  Regular, unlabored respirations on room air GI: Abdomen soft and nontender   NEURO:  Mental Status: AA&Ox3, patient is able to give clear and coherent history Speech/Language: speech is without dysarthria or aphasia.  Naming, repetition, fluency, and comprehension intact.  Cranial Nerves:  II: right pupil >left, Right pupil nonreactive.  Visual fields full. Endorses double vision  III, IV, VI: EOMI. Eyelids elevate symmetrically.  V: Sensation is intact to light touch and symmetrical to face.  VII: right facial  VIII: hearing intact to voice. IX, X: Palate elevates symmetrically. Phonation is normal.  KP:Dynloizm shrug 5/5. XII: tongue is midline without fasciculations. Motor: 5/5 strength in left arm and left leg. 4+/5 in right arm and right leg with decreased right hand grip  Tone: is normal and bulk is normal Sensation- Intact to light touch bilaterally. Extinction absent to light  touch to DSS.   Coordination: FTN intact bilaterally, HKS: no ataxia in BLE.No drift.  Gait- deferred  Most Recent NIH   1a Level of Conscious.:  1b LOC Questions:  1c LOC Commands:  2 Best Gaze:  3 Visual:  4 Facial Palsy: 1 5a Motor Arm - left:  5b Motor Arm - Right: 1 6a Motor Leg - Left:  6b Motor Leg - Right: 1 7 Limb Ataxia:  8 Sensory:  9 Best Language:  10 Dysarthria:  11 Extinct. and Inatten.:  TOTAL: 3   ASSESSMENT/PLAN  [name] is a 89 y.o. male with history of prior stroke/TIA with no residual deficit, prior C-spine surgeries with residual bilateral lower extremity weakness, Hypertension, hyperlipidemia, history of atrial fibrillation not on anticoagulation for unclear reasons, presents for evaluation of worsening dizziness over a couple days and difficulty with not being able to articulate words properly for almost over a week.  He reports that symptoms started somewhat insidiously but over the last 2 days he has been more dizzy-which he describes as lightheadedness and it worsens with movement.   NIH on Admission 3  Acute Ischemic Infarct:  left posterior temporal lobe  Etiology:  small vessel disease   remote history of A-fib in 2015 CT head No acute abnormality. Small vessel disease.  CTA head & neck NO LVO. Severe left and mild right vertebral artery origin stenosis.  MRI  Small acute infarct in the periventricular left posterior temporal lobe.  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 Yes; Fluid consistency: Thin   Advance diet as tolerated  Other Stroke Risk Factors ETOH use, advised to drink no more than 2 drink(s) a day  Other Active Problems GERD Vertigo    Hospital day # 0     Patient presented with episode of dizziness and speech difficulties which appear to have resolved and MRI shows small left temporal infarct.  There is a remote history of A-fib documented on EKG on 07/05/2013 but for unclear reason patient was never put on anticoagulation.  Recommend start Eliquis  and stop antiplatelet agents.  Continue aggressive risk factor modification and ongoing stroke workup.  Long discussion with patient and daughter and granddaughter at the bedside and answered questions.  Discussed with Dr. Derryl and EP team nurse practitioner.  Follow-up as outpatient stroke clinic in 2 months   I personally spent a total of 35 minutes in the care of the patient today including getting/reviewing separately obtained history, performing a medically appropriate exam/evaluation, counseling and educating, placing orders, referring and communicating with other health care professionals, documenting clinical information in the EHR, independently interpreting results, and coordinating care.        Eather Popp, MD Medical Director [facility] Pager: [phone] 11/14/2023 12:57 PM    To contact Stroke Continuity provider, please refer to [email or web address]. After hours, contact General Neurology

## 2023-11-14 NOTE — Telephone Encounter (Signed)

## 2023-11-14 NOTE — TOC Transition Note (Signed)
 Transition of Care [facility]) - Discharge Note   Patient Details  Name: Terry Gray MRN: [id number] Date of Birth: 26-Apr-1933  Transition of [facility] CM/SW Contact:  Andrez JULIANNA George, RN Phone Number: 11/14/2023, 1:00 PM   Clinical Narrative:     Pt is discharging home with home health services through [place]. Information on the AVS.  Pt is discharging with Eliquis . TOC pharmacy will fill the first 30 days with coupon.  Pt has transportation home.  Final next level of care: Home w Home Health Services Barriers to Discharge: No Barriers Identified   Patient Goals and CMS Choice   CMS Medicare.gov Compare Post Acute Care list provided to:: Patient Choice offered to / list presented to : Patient      Discharge Placement                       Discharge Plan and Services Additional resources added to the After Visit Summary for     Discharge Planning Services: CM Consult Post Acute Care Choice: Home Health                    HH Arranged: PT, OT [facility] Agency: [facility] Date [facility] Agency Contacted: 11/13/23   Representative spoke with at [facility] Agency: Darleene  Social Drivers of Health (SDOH) Interventions SDOH Screenings   Food Insecurity: No Food Insecurity (11/13/2023)  Housing: Unknown (11/13/2023)  Transportation Needs: No Transportation Needs (11/13/2023)  Utilities: Not At Risk (11/13/2023)  Social Connections: Unknown (11/13/2023)  Tobacco Use: Medium Risk (11/12/2023)     Readmission Risk Interventions    05/05/2021    1:28 PM  Readmission Risk Prevention Plan  Post Dischage Appt Complete  Medication Screening Complete  Transportation Screening Complete

## 2023-11-14 NOTE — Discharge Summary (Signed)
 Physician Discharge Summary  Terry Gray FMW:[id number] DOB: [birth year] DOA: 11/12/2023  PCP: Ransom Other, MD  Admit date: 11/12/2023 Discharge date: 11/14/2023  Admitted From: Home Disposition: Home  Recommendations for Outpatient Follow-up:  Follow up with PCP in 1 week with repeat CBC/BMP/f/u neurology Follow up in ED if symptoms worsen or new appear   Home Health: Pt/OT  Discharge Condition: Stable CODE STATUS: Full Diet recommendation: Heart healthy  Brief/Interim Summary:  Terry Gray is a 89 y.o. male with medical history significant for prior TIA, hypertension, dyslipidemia, arthritis, DDD, bradycardia, and BPH who presented to the ED with complaints of dizziness and headache that started on 9/6 around 1700 which spontaneously improved.  He developed the dizziness yet again with some nausea and vomiting on 9/7 which prompted him to come to the ED via EMS.  He was given some Zofran  and route with some symptomatic improvement but he continues to have dizziness and was noted to have heart rate that dropped briefly into the 40s.    ED Course: Vital signs currently stable and patient afebrile.  No significant lab abnormalities aside from troponin of 28.  Brain MRI demonstrates acute CVA to left temporal region.   Patient admitted for further evaluation.  Assessment/plan Acute left temporal lobe CVA Evaluated by neurology. - Patient received aspirin  and Plavix .  History of atrial fibrillation in the past not on anticoagulation.  antiplatelets switched to Eliquis  on discharge.  Continue statins. Transthoracic echo is negative for PFO or thrombus. CT angiogram head and neck with no large vessel occlusion.  Severe left and mild right vertebral artery origin stenosis. Follow-up with neurology outpatient.   Elevated troponin -Minimal and without change, likely benign.  No concerns for ACS   Hypertension - Continue home medications including ramipril   History of atrial  fibrillation: EKG 07/05/2013 noted A-fib.  Patient is not on any Eliquis  or rate controlling agents.  He has a history of bradycardia as well.  Dyslipidemia - Continue statin, atorvastatin  dose increased to 40 mg.   BPH - Continue finasteride      Patient discharged home with home health care PT OT.   Discharge Diagnoses:  Principal Problem:   CVA (cerebral vascular accident) (HCC) Active Problems:   Essential hypertension   Elevated lipids   TIA (transient ischemic attack)   Cervical stenosis of spinal canal   Cervical spondylosis with myelopathy and radiculopathy   Dizziness    Discharge Instructions  Discharge Instructions     Ambulatory referral to Neurology   Complete by: As directed    An appointment is requested in approximately: 2 weeks   Diet - low sodium heart healthy   Complete by: As directed    Discharge instructions   Complete by: As directed    1.  Stop taking aspirin  and start Eliquis  5 mg twice daily for stroke prevention. 2.  Increase the dose of atorvastatin  to 40 mg daily. 3.  Follow-up with neurology, primary care provider. 4.  Return to the ER if recurrence of neurological symptoms.   Increase activity slowly   Complete by: As directed       Allergies as of 11/14/2023   No Known Allergies      Medication List     STOP taking these medications    aspirin  EC 81 MG tablet       TAKE these medications    atorvastatin  40 MG tablet Commonly known as: LIPITOR Take 1 tablet (40 mg total) by mouth every evening. What  changed:  medication strength how much to take   docusate sodium  100 MG capsule Commonly known as: COLACE Take 100 mg by mouth daily.   Eliquis  5 MG Tabs tablet Generic drug: apixaban  Take 1 tablet (5 mg total) by mouth 2 (two) times daily.   finasteride  5 MG tablet Commonly known as: PROSCAR  Take 5 mg by mouth daily.   ramipril  10 MG capsule Commonly known as: ALTACE  Take 10 mg by mouth every morning.    Vitamin D  (Ergocalciferol ) 1.25 MG (50000 UNIT) Caps capsule Commonly known as: DRISDOL Take 50,000 Units by mouth 2 (two) times a week.        Follow-up Information     [facility] Follow up.   Specialty: Home Health Services Why: Hedda will contact you for the first home visit. Contact information: [address] [place] [address] [phone]         Ransom Other, MD Follow up in 1 week(s).   Specialty: Internal Medicine Contact information: [address], [address] [place] [address] [phone]         [facility] Follow up in 2 week(s).   Contact information: [address]     [address] [place]  [address] [phone]               No Known Allergies  Consultations:    Procedures/Studies: ECHOCARDIOGRAM COMPLETE Result Date: 11/13/2023    ECHOCARDIOGRAM REPORT   Patient Name:   Terry Gray [facility] Date of Exam: 11/13/2023 Medical Rec #:  [id number]       Height:       58.0 in Accession #:    [id number]      Weight:       140.0 lb Date of Birth:  1934/01/23      BSA:          1.565 m Patient Age:    89 years        BP:           125/59 mmHg Patient Gender: M               HR:           63 bpm. Exam Location:  Inpatient Procedure: 2D Echo, Cardiac Doppler and Color Doppler (Both Spectral and Color            Flow Doppler were utilized during procedure). Indications:    Stroke I63.9  History:        Patient has prior history of Echocardiogram examinations, most                 recent 07/06/2013. Stroke and TIA, Arrythmias:Bradycardia,                 Signs/Symptoms:Chest Pain and Dyspnea; Risk                 Factors:Hypertension.  Sonographer:    Thea Norlander RCS Referring Phys: [id number] PRATIK D [facility] IMPRESSIONS  1. Left ventricular ejection fraction, by estimation, is 60 to 65%. The left ventricle has normal function. The left ventricle has no regional wall motion abnormalities.  There is mild asymmetric left ventricular hypertrophy of the basal and septal segments. Left ventricular diastolic parameters were normal.  2. Right ventricular systolic function is mildly reduced. The right ventricular size is mildly enlarged.  3. Left atrial size was mildly dilated.  4. The mitral valve is abnormal. Mild mitral valve regurgitation. No evidence of  mitral stenosis. Moderate mitral annular calcification.  5. The aortic valve is tricuspid. There is moderate calcification of the aortic valve. There is moderate thickening of the aortic valve. Aortic valve regurgitation is not visualized. Aortic valve sclerosis is present, with no evidence of aortic valve stenosis.  6. Aortic dilatation noted. There is mild dilatation of the ascending aorta, measuring 39 mm.  7. The inferior vena cava is normal in size with greater than 50% respiratory variability, suggesting right atrial pressure of 3 mmHg. FINDINGS  Left Ventricle: Left ventricular ejection fraction, by estimation, is 60 to 65%. The left ventricle has normal function. The left ventricle has no regional wall motion abnormalities. Strain was performed and the global longitudinal strain is indeterminate. The left ventricular internal cavity size was normal in size. There is mild asymmetric left ventricular hypertrophy of the basal and septal segments. Left ventricular diastolic parameters were normal. Right Ventricle: The right ventricular size is mildly enlarged. No increase in right ventricular wall thickness. Right ventricular systolic function is mildly reduced. Left Atrium: Left atrial size was mildly dilated. Right Atrium: Right atrial size was normal in size. Pericardium: There is no evidence of pericardial effusion. Mitral Valve: The mitral valve is abnormal. There is mild thickening of the mitral valve leaflet(s). There is mild calcification of the mitral valve leaflet(s). Moderate mitral annular calcification. Mild mitral valve regurgitation. No  evidence of mitral  valve stenosis. Tricuspid Valve: The tricuspid valve is normal in structure. Tricuspid valve regurgitation is mild . No evidence of tricuspid stenosis. Aortic Valve: The aortic valve is tricuspid. There is moderate calcification of the aortic valve. There is moderate thickening of the aortic valve. Aortic valve regurgitation is not visualized. Aortic valve sclerosis is present, with no evidence of aortic valve stenosis. Aortic valve peak gradient measures 6.1 mmHg. Pulmonic Valve: The pulmonic valve was normal in structure. Pulmonic valve regurgitation is not visualized. No evidence of pulmonic stenosis. Aorta: Aortic dilatation noted. There is mild dilatation of the ascending aorta, measuring 39 mm. Venous: The inferior vena cava is normal in size with greater than 50% respiratory variability, suggesting right atrial pressure of 3 mmHg. IAS/Shunts: No atrial level shunt detected by color flow Doppler. Additional Comments: 3D was performed not requiring image post processing on an independent workstation and was indeterminate.  LEFT VENTRICLE PLAX 2D LVIDd:         3.80 cm   Diastology LVIDs:         2.30 cm   LV e' medial:    9.68 cm/s LV PW:         1.00 cm   LV E/e' medial:  12.7 LV IVS:        1.20 cm   LV e' lateral:   12.90 cm/s LVOT diam:     2.20 cm   LV E/e' lateral: 9.5 LV SV:         75 LV SV Index:   48 LVOT Area:     3.80 cm  RIGHT VENTRICLE RV S prime:     13.60 cm/s TAPSE (M-mode): 2.3 cm LEFT ATRIUM             Index        RIGHT ATRIUM           Index LA diam:        3.80 cm 2.43 cm/m   RA Area:     13.60 cm LA Vol (A2C):   35.0 ml 22.33 ml/m  RA Volume:  29.60 ml  18.91 ml/m LA Vol (A4C):   37.3 ml 23.83 ml/m LA Biplane Vol: 37.2 ml 23.77 ml/m  AORTIC VALVE AV Area (Vmax): 2.67 cm AV Vmax:        123.00 cm/s AV Peak Grad:   6.1 mmHg LVOT Vmax:      86.50 cm/s LVOT Vmean:     56.100 cm/s LVOT VTI:       0.197 m  AORTA Ao Root diam: 3.40 cm Ao Asc diam:  3.90 cm MITRAL  VALVE MV Area (PHT): 2.76 cm     SHUNTS MV Decel Time: 275 msec     Systemic VTI:  0.20 m MV E velocity: 123.00 cm/s  Systemic Diam: 2.20 cm MV A velocity: 134.00 cm/s MV E/A ratio:  0.92 Maude Emmer MD Electronically signed by Maude Emmer MD Signature Date/Time: 11/13/2023/4:47:25 PM    Final    CT ANGIO HEAD NECK W WO CM Result Date: 11/13/2023 CLINICAL DATA:  stroke, vertigo EXAM: CT ANGIOGRAPHY HEAD AND NECK WITH AND WITHOUT CONTRAST TECHNIQUE: Multidetector CT imaging of the head and neck was performed using the standard protocol during bolus administration of intravenous contrast. Multiplanar CT image reconstructions and MIPs were obtained to evaluate the vascular anatomy. Carotid stenosis measurements (when applicable) are obtained utilizing NASCET criteria, using the distal internal carotid diameter as the denominator. RADIATION DOSE REDUCTION: This exam was performed according to the departmental dose-optimization program which includes automated exposure control, adjustment of the mA and/or kV according to patient size and/or use of iterative reconstruction technique. CONTRAST:  75mL OMNIPAQUE  IOHEXOL  350 MG/ML SOLN COMPARISON:  MRA Jul 05, 2013. FINDINGS: CTA NECK FINDINGS Aortic arch: Aortic atherosclerosis. Great vessel origins are patent. Right carotid system: No evidence of dissection, stenosis (50% or greater), or occlusion. Left carotid system: No evidence of dissection, stenosis (50% or greater), or occlusion. Vertebral arteries: Severe left and mild right vertebral artery origin stenosis. Patent bilaterally. Skeleton: Severe multilevel cervical degenerative change including severe craniocervical degenerative change. Other neck: No acute abnormality on limited assessment. Upper chest: Lung apices are clear. Review of the MIP images confirms the above findings CTA HEAD FINDINGS Anterior circulation: Bilateral intracranial ICAs, MCAs, and ACAs are patent without proximal hemodynamically significant  stenosis. Posterior circulation: Bilateral intradural vertebral arteries, basilar artery and bilateral push Fillers are patent without proximal hemodynamically significant stenosis. Venous sinuses: As permitted by contrast timing, patent. Review of the MIP images confirms the above findings IMPRESSION: 1. No emergent large vessel occlusion. 2. Severe left and mild right vertebral artery origin stenosis. 3. Severe multilevel cervical degenerative change including severe craniocervical degenerative change. 4. Aortic Atherosclerosis (ICD10-I70.0). Electronically Signed   By: Gilmore GORMAN Molt M.D.   On: 11/13/2023 00:16   MR BRAIN WO CONTRAST Result Date: 11/12/2023 CLINICAL DATA:  Neuro deficit, acute, stroke suspected EXAM: MRI HEAD WITHOUT CONTRAST TECHNIQUE: Multiplanar, multiecho pulse sequences of the brain and surrounding structures were obtained without intravenous contrast. COMPARISON:  None Available. FINDINGS: Brain: Small acute infarct in the periventricular left posterior temporal lobe. No acute hemorrhage, hydrocephalus, extra-axial collection or mass lesion. Small remote right cerebellar infarct and moderate T2/FLAIR hyperintensities in the white matter compatible with chronic microvascular ischemic change. Vascular: Normal flow voids. Skull and upper cervical spine: Normal marrow signal. Sinuses/Orbits: Negative. IMPRESSION: 1. Small acute infarct in the periventricular left posterior temporal lobe. 2. Moderate chronic microvascular ischemic disease. Electronically Signed   By: Gilmore GORMAN Molt M.D.   On: 11/12/2023 22:55   DG Chest Portable  1 View Result Date: 11/12/2023 CLINICAL DATA:  Short of breath, chest pain, dizziness EXAM: PORTABLE CHEST 1 VIEW COMPARISON:  04/28/2021 FINDINGS: Single frontal view of the chest demonstrates an unremarkable cardiac silhouette. No airspace disease, effusion, or pneumothorax. No acute bony abnormality. IMPRESSION: 1. No acute intrathoracic process.  Electronically Signed   By: Ozell Daring M.D.   On: 11/12/2023 19:57   CT Head Wo Contrast Result Date: 11/12/2023 CLINICAL DATA:  Neuro deficit, acute, stroke suspected. EXAM: CT HEAD WITHOUT CONTRAST TECHNIQUE: Contiguous axial images were obtained from the base of the skull through the vertex without intravenous contrast. RADIATION DOSE REDUCTION: This exam was performed according to the departmental dose-optimization program which includes automated exposure control, adjustment of the mA and/or kV according to patient size and/or use of iterative reconstruction technique. COMPARISON:  Head CT 04/28/2021 and MRI 07/05/2013 FINDINGS: Brain: There is no evidence of an acute infarct, intracranial hemorrhage, mass, midline shift, or extra-axial fluid collection. Mild cerebral atrophy is within normal limits for age. Cerebral white matter hypodensities are similar to the prior CT and are nonspecific but compatible with mild chronic small vessel ischemic disease. Chronic lacunar infarcts are again noted in the left thalamus and right cerebellar hemisphere. Vascular: Calcified atherosclerosis at the skull base. No hyperdense vessel. Skull: No fracture or suspicious lesion. Sinuses/Orbits: Paranasal sinuses and mastoid air cells are clear. Bilateral cataract extraction. Other: None. IMPRESSION: 1. No evidence of acute intracranial abnormality. 2. Mild chronic small vessel ischemic disease. Electronically Signed   By: Dasie Hamburg M.D.   On: 11/12/2023 18:38      Subjective:   Discharge Exam: Vitals:   11/14/23 0737 11/14/23 1100  BP: (!) 140/71 (!) 150/78  Pulse: (!) 52 70  Resp: 18   Temp: 97.8 F (36.6 C) 98.6 F (37 C)  SpO2: 95% 98%    General: Pt is alert, awake, not in acute distress Cardiovascular: rate controlled, S1/S2 + Respiratory: bilateral decreased breath sounds at bases Abdominal: Soft, NT, ND, bowel sounds + Extremities: no edema, no cyanosis    The results of significant  diagnostics from this hospitalization (including imaging, microbiology, ancillary and laboratory) are listed below for reference.     Microbiology: Recent Results (from the past 240 hours)  Resp panel by RT-PCR (RSV, Flu A&B, Covid) Anterior Nasal Swab     Status: None   Collection Time: 11/12/23  7:28 PM   Specimen: Anterior Nasal Swab  Result Value Ref Range Status   SARS Coronavirus 2 by RT PCR NEGATIVE NEGATIVE Final   Influenza A by PCR NEGATIVE NEGATIVE Final   Influenza B by PCR NEGATIVE NEGATIVE Final    Comment: (NOTE) The Xpert Xpress SARS-CoV-2/FLU/RSV plus assay is intended as an aid in the diagnosis of influenza from Nasopharyngeal swab specimens and should not be used as a sole basis for treatment. Nasal washings and aspirates are unacceptable for Xpert Xpress SARS-CoV-2/FLU/RSV testing.  Fact Sheet for Patients: BloggerCourse.com  Fact Sheet for Healthcare Providers: SeriousBroker.it  This test is not yet approved or cleared by the [place]  FDA and has been authorized for detection and/or diagnosis of SARS-CoV-2 by FDA under an Emergency Use Authorization (EUA). This EUA will remain in effect (meaning this test can be used) for the duration of the COVID-19 declaration under Section 564(b)(1) of the Act, 21 U.S.C. section 360bbb-3(b)(1), unless the authorization is terminated or revoked.     Resp Syncytial Virus by PCR NEGATIVE NEGATIVE Final    Comment: (NOTE) Fact  Sheet for Patients: BloggerCourse.com  Fact Sheet for Healthcare Providers: SeriousBroker.it  This test is not yet approved or cleared by the [place]  FDA and has been authorized for detection and/or diagnosis of SARS-CoV-2 by FDA under an Emergency Use Authorization (EUA). This EUA will remain in effect (meaning this test can be used) for the duration of the COVID-19 declaration under  Section 564(b)(1) of the Act, 21 U.S.C. section 360bbb-3(b)(1), unless the authorization is terminated or revoked.  Performed at [facility], [address] [place], [place] [address]      Labs: BNP (last 3 results) No results for input(s): BNP in the last 8760 hours. Basic Metabolic Panel: Recent Labs  Lab 11/12/23 1811  NA 140  K 4.1  CL 108  CO2 24  GLUCOSE 108*  BUN 21  CREATININE 0.97  CALCIUM  8.5*   Liver Function Tests: Recent Labs  Lab 11/12/23 1811  AST 23  ALT 14  ALKPHOS 45  BILITOT 0.7  PROT 6.2*  ALBUMIN 3.2*   Recent Labs  Lab 11/12/23 1811  LIPASE 51   No results for input(s): AMMONIA in the last 168 hours. CBC: Recent Labs  Lab 11/12/23 1811  WBC 9.3  NEUTROABS 6.6  HGB 13.0  HCT 40.6  MCV 87.3  PLT 107*   Cardiac Enzymes: No results for input(s): CKTOTAL, CKMB, CKMBINDEX, TROPONINI in the last 168 hours. BNP: Invalid input(s): POCBNP CBG: No results for input(s): GLUCAP in the last 168 hours. D-Dimer No results for input(s): DDIMER in the last 72 hours. Hgb A1c Recent Labs    11/13/23 0433  HGBA1C 5.6   Lipid Profile Recent Labs    11/13/23 0433  CHOL 141  HDL 54  LDLCALC 83  TRIG 18  CHOLHDL 2.6   Thyroid  function studies No results for input(s): TSH, T4TOTAL, T3FREE, THYROIDAB in the last 72 hours.  Invalid input(s): FREET3 Anemia work up No results for input(s): VITAMINB12, FOLATE, FERRITIN, TIBC, IRON, RETICCTPCT in the last 72 hours. Urinalysis    Component Value Date/Time   COLORURINE YELLOW 11/12/2023 1811   APPEARANCEUR CLEAR 11/12/2023 1811   LABSPEC 1.018 11/12/2023 1811   PHURINE 6.0 11/12/2023 1811   GLUCOSEU NEGATIVE 11/12/2023 1811   HGBUR NEGATIVE 11/12/2023 1811   BILIRUBINUR NEGATIVE 11/12/2023 1811   KETONESUR NEGATIVE 11/12/2023 1811   PROTEINUR NEGATIVE 11/12/2023 1811   UROBILINOGEN 0.2 07/05/2013 0935   NITRITE NEGATIVE 11/12/2023 1811    LEUKOCYTESUR NEGATIVE 11/12/2023 1811   Sepsis Labs Recent Labs  Lab 11/12/23 1811  WBC 9.3   Microbiology Recent Results (from the past 240 hours)  Resp panel by RT-PCR (RSV, Flu A&B, Covid) Anterior Nasal Swab     Status: None   Collection Time: 11/12/23  7:28 PM   Specimen: Anterior Nasal Swab  Result Value Ref Range Status   SARS Coronavirus 2 by RT PCR NEGATIVE NEGATIVE Final   Influenza A by PCR NEGATIVE NEGATIVE Final   Influenza B by PCR NEGATIVE NEGATIVE Final    Comment: (NOTE) The Xpert Xpress SARS-CoV-2/FLU/RSV plus assay is intended as an aid in the diagnosis of influenza from Nasopharyngeal swab specimens and should not be used as a sole basis for treatment. Nasal washings and aspirates are unacceptable for Xpert Xpress SARS-CoV-2/FLU/RSV testing.  Fact Sheet for Patients: BloggerCourse.com  Fact Sheet for Healthcare Providers: SeriousBroker.it  This test is not yet approved or cleared by the [place]  FDA and has been authorized for detection and/or diagnosis of SARS-CoV-2 by  FDA under an Emergency Use Authorization (EUA). This EUA will remain in effect (meaning this test can be used) for the duration of the COVID-19 declaration under Section 564(b)(1) of the Act, 21 U.S.C. section 360bbb-3(b)(1), unless the authorization is terminated or revoked.     Resp Syncytial Virus by PCR NEGATIVE NEGATIVE Final    Comment: (NOTE) Fact Sheet for Patients: BloggerCourse.com  Fact Sheet for Healthcare Providers: SeriousBroker.it  This test is not yet approved or cleared by the [place]  FDA and has been authorized for detection and/or diagnosis of SARS-CoV-2 by FDA under an Emergency Use Authorization (EUA). This EUA will remain in effect (meaning this test can be used) for the duration of the COVID-19 declaration under Section 564(b)(1) of the Act, 21  U.S.C. section 360bbb-3(b)(1), unless the authorization is terminated or revoked.  Performed at [facility], [address], [place], [place] [address]      Time coordinating discharge: 35 minutes  SIGNED:   Derryl Duval, MD  [facility] 11/14/2023, 3:42 PM

## 2023-11-16 DIAGNOSIS — I700 Atherosclerosis of aorta: Secondary | ICD-10-CM | POA: Diagnosis not present

## 2023-11-16 DIAGNOSIS — I480 Paroxysmal atrial fibrillation: Secondary | ICD-10-CM | POA: Diagnosis not present

## 2023-11-16 DIAGNOSIS — I119 Hypertensive heart disease without heart failure: Secondary | ICD-10-CM | POA: Diagnosis not present

## 2023-11-16 DIAGNOSIS — M4802 Spinal stenosis, cervical region: Secondary | ICD-10-CM | POA: Diagnosis not present

## 2023-11-16 DIAGNOSIS — M545 Low back pain, unspecified: Secondary | ICD-10-CM | POA: Diagnosis not present

## 2023-11-16 DIAGNOSIS — G8929 Other chronic pain: Secondary | ICD-10-CM | POA: Diagnosis not present

## 2023-11-16 DIAGNOSIS — I69351 Hemiplegia and hemiparesis following cerebral infarction affecting right dominant side: Secondary | ICD-10-CM | POA: Diagnosis not present

## 2023-11-16 DIAGNOSIS — M4722 Other spondylosis with radiculopathy, cervical region: Secondary | ICD-10-CM | POA: Diagnosis not present

## 2023-11-16 DIAGNOSIS — I083 Combined rheumatic disorders of mitral, aortic and tricuspid valves: Secondary | ICD-10-CM | POA: Diagnosis not present

## 2023-11-16 DIAGNOSIS — I69391 Dysphagia following cerebral infarction: Secondary | ICD-10-CM | POA: Diagnosis not present

## 2023-11-16 DIAGNOSIS — Z7901 Long term (current) use of anticoagulants: Secondary | ICD-10-CM | POA: Diagnosis not present

## 2023-11-16 DIAGNOSIS — K219 Gastro-esophageal reflux disease without esophagitis: Secondary | ICD-10-CM | POA: Diagnosis not present

## 2023-11-16 DIAGNOSIS — M21372 Foot drop, left foot: Secondary | ICD-10-CM | POA: Diagnosis not present

## 2023-11-16 DIAGNOSIS — Z9181 History of falling: Secondary | ICD-10-CM | POA: Diagnosis not present

## 2023-11-16 DIAGNOSIS — N400 Enlarged prostate without lower urinary tract symptoms: Secondary | ICD-10-CM | POA: Diagnosis not present

## 2023-11-16 DIAGNOSIS — M21371 Foot drop, right foot: Secondary | ICD-10-CM | POA: Diagnosis not present

## 2023-11-16 DIAGNOSIS — E7800 Pure hypercholesterolemia, unspecified: Secondary | ICD-10-CM | POA: Diagnosis not present

## 2023-11-16 DIAGNOSIS — M4712 Other spondylosis with myelopathy, cervical region: Secondary | ICD-10-CM | POA: Diagnosis not present

## 2023-11-16 DIAGNOSIS — Z87891 Personal history of nicotine dependence: Secondary | ICD-10-CM | POA: Diagnosis not present

## 2023-11-20 DIAGNOSIS — E559 Vitamin D deficiency, unspecified: Secondary | ICD-10-CM | POA: Diagnosis not present

## 2023-11-20 DIAGNOSIS — Z8673 Personal history of transient ischemic attack (TIA), and cerebral infarction without residual deficits: Secondary | ICD-10-CM | POA: Diagnosis not present

## 2023-11-24 IMAGING — CR DG CERVICAL SPINE 2 OR 3 VIEWS
2 series · 2 of 2 positions shown · non-contrast
Comparison: None.

CLINICAL DATA: C4-C7 ACDF

EXAM:
CERVICAL SPINE - 2-3 VIEW

[xtable lateral (1 of 2)]
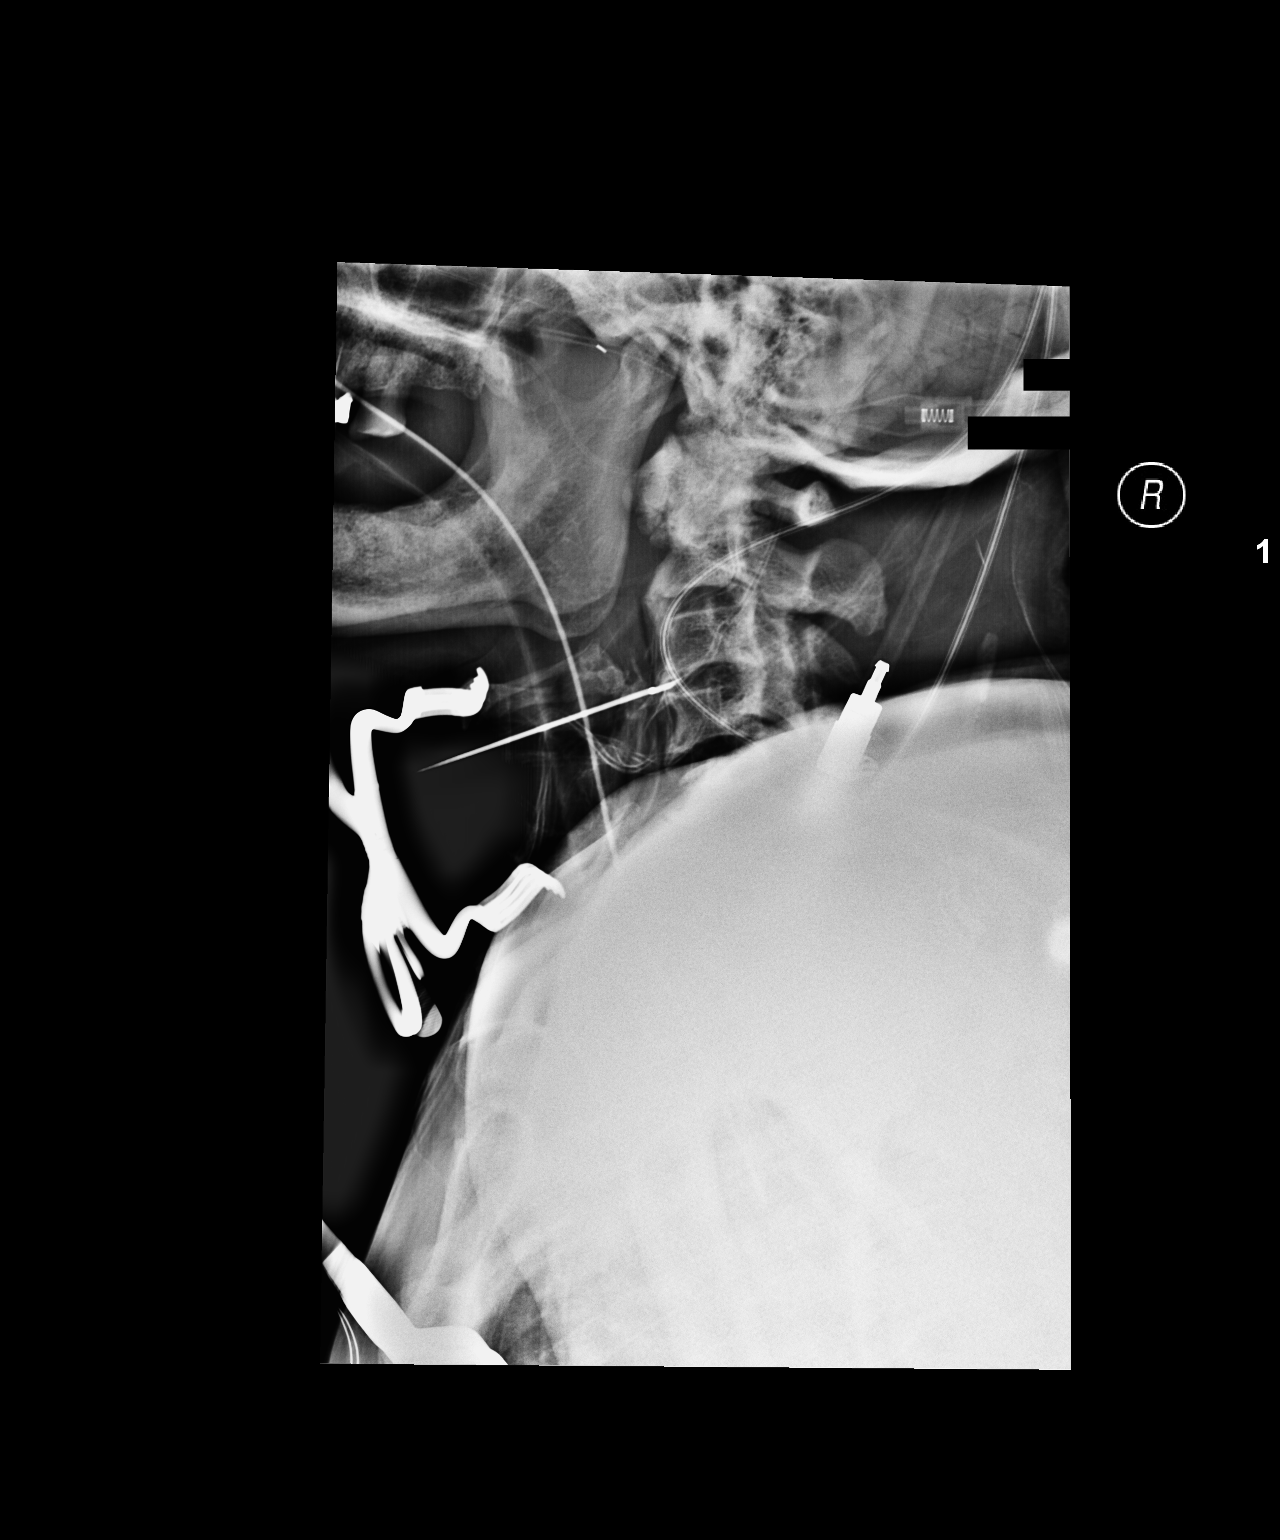

[xtable lateral (2 of 2)]
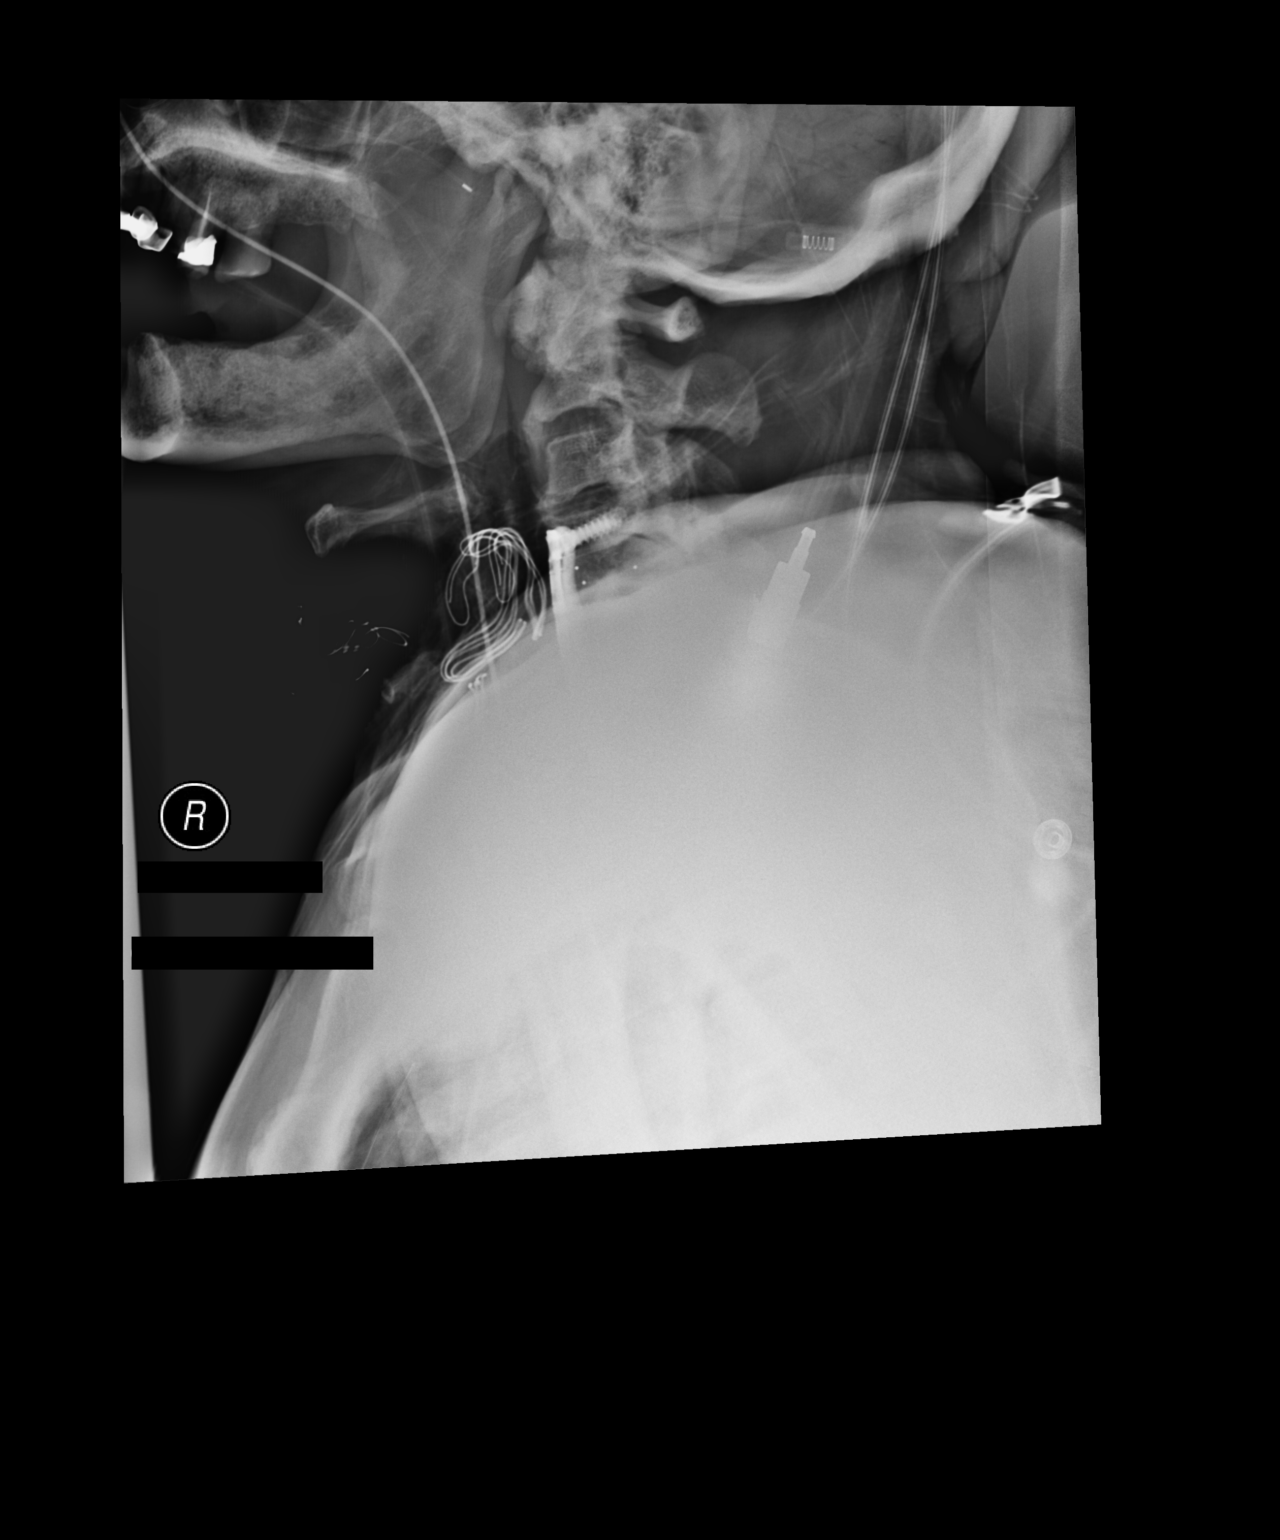

[2 of 2 positions shown; findings below may reference images not displayed]

FINDINGS: Sequential intraoperative lateral radiographs of the cervical spine
demonstrate marking instrument at the C3-C4 disc space and
subsequently anterior cervical discectomy and fusion of C4 through
C7.
IMPRESSION: Intraoperative lateral radiographs demonstrate marking instrument at
the C3-C4 disc space and subsequently anterior cervical discectomy
and fusion of C4 through C7.

## 2023-11-29 DIAGNOSIS — K219 Gastro-esophageal reflux disease without esophagitis: Secondary | ICD-10-CM | POA: Diagnosis not present

## 2023-11-29 DIAGNOSIS — M1990 Unspecified osteoarthritis, unspecified site: Secondary | ICD-10-CM | POA: Diagnosis not present

## 2023-11-29 DIAGNOSIS — G8929 Other chronic pain: Secondary | ICD-10-CM | POA: Diagnosis not present

## 2023-11-29 DIAGNOSIS — E785 Hyperlipidemia, unspecified: Secondary | ICD-10-CM | POA: Diagnosis not present

## 2023-11-29 DIAGNOSIS — D6869 Other thrombophilia: Secondary | ICD-10-CM | POA: Diagnosis not present

## 2023-11-29 DIAGNOSIS — J309 Allergic rhinitis, unspecified: Secondary | ICD-10-CM | POA: Diagnosis not present

## 2023-11-29 DIAGNOSIS — E559 Vitamin D deficiency, unspecified: Secondary | ICD-10-CM | POA: Diagnosis not present

## 2023-11-29 DIAGNOSIS — I10 Essential (primary) hypertension: Secondary | ICD-10-CM | POA: Diagnosis not present

## 2023-11-29 DIAGNOSIS — E663 Overweight: Secondary | ICD-10-CM | POA: Diagnosis not present

## 2023-11-29 DIAGNOSIS — N400 Enlarged prostate without lower urinary tract symptoms: Secondary | ICD-10-CM | POA: Diagnosis not present

## 2023-11-29 DIAGNOSIS — I4891 Unspecified atrial fibrillation: Secondary | ICD-10-CM | POA: Diagnosis not present

## 2023-11-29 DIAGNOSIS — G959 Disease of spinal cord, unspecified: Secondary | ICD-10-CM | POA: Diagnosis not present

## 2023-12-04 DIAGNOSIS — G459 Transient cerebral ischemic attack, unspecified: Secondary | ICD-10-CM | POA: Diagnosis not present

## 2023-12-04 DIAGNOSIS — I10 Essential (primary) hypertension: Secondary | ICD-10-CM | POA: Diagnosis not present

## 2023-12-05 DIAGNOSIS — I10 Essential (primary) hypertension: Secondary | ICD-10-CM | POA: Diagnosis not present

## 2023-12-05 DIAGNOSIS — N400 Enlarged prostate without lower urinary tract symptoms: Secondary | ICD-10-CM | POA: Diagnosis not present

## 2023-12-05 DIAGNOSIS — E782 Mixed hyperlipidemia: Secondary | ICD-10-CM | POA: Diagnosis not present

## 2023-12-05 DIAGNOSIS — M1990 Unspecified osteoarthritis, unspecified site: Secondary | ICD-10-CM | POA: Diagnosis not present

## 2023-12-05 DIAGNOSIS — G459 Transient cerebral ischemic attack, unspecified: Secondary | ICD-10-CM | POA: Diagnosis not present

## 2023-12-08 ENCOUNTER — Other Ambulatory Visit (HOSPITAL_COMMUNITY): Payer: Self-pay

## 2023-12-10 NOTE — Progress Notes (Deleted)
 PATIENT: Terry Gray DOB: [birth year]  REASON FOR VISIT: follow up HISTORY FROM: patient PRIMARY NEUROLOGIST: Dr. Rosemarie  No chief complaint on file.    HISTORY OF PRESENT ILLNESS: Today   Terry Gray is a 89 y.o. male who has been followed in this office for ***. Returns today for follow-up.   Imaging:   MRI brain: IMPRESSION: 1. Small acute infarct in the periventricular left posterior temporal lobe. 2. Moderate chronic microvascular ischemic disease  CTA head and neck: IMPRESSION: 1. No emergent large vessel occlusion. 2. Severe left and mild right vertebral artery origin stenosis. 3. Severe multilevel cervical degenerative change including severe craniocervical degenerative change. 4. Aortic Atherosclerosis (ICD10-I70.0)   FU: Labs: Carotid dopplers? ECHO: Discharge note Work? OSA?   HISTORY (copied from Hospital): Terry Gray is a 89 y.o. male with history of prior stroke/TIA with no residual deficit, prior C-spine surgeries with residual bilateral lower extremity weakness, Hypertension, hyperlipidemia, history of atrial fibrillation not on anticoagulation for unclear reasons, presents for evaluation of worsening dizziness over a couple days and difficulty with not being able to articulate words properly for almost over a week.  He reports that symptoms started somewhat insidiously but over the last 2 days he has been more dizzy-which he describes as lightheadedness and it worsens with movement.   NIH on Admission 3   Acute Ischemic Infarct:  left posterior temporal lobe  Etiology:  small vessel disease   remote history of A-fib in 2015 CT head No acute abnormality. Small vessel disease.  CTA head & neck NO LVO. Severe left and mild right vertebral artery origin stenosis.  MRI  Small acute infarct in the periventricular left posterior temporal lobe.  Moderate chronic microvascular ischemic disease 2D Echo ordered  Loop recorder requested at  discharge LDL 83 HgbA1c 5.6 VTE prophylaxis - SCD aspirin  81 mg daily prior to admission, now on Eliquis  given remote history of A-fib Therapy recommendations:  Pending Disposition:  pending    Hx of Stroke/TIA Vertebrobasilar transient ischemic attack in June 2012   Remote atrial fibrillation Home Meds: none  Noted once in ED visit with remote history of A fib on EKG on 07/05/2013  Continue telemetry monitoring Start Eliquis  and stop antiplatelet agents Hypertension Home meds:  none Stable Blood Pressure Goal: SBP less than 160    Hyperlipidemia Home meds:  atorvastatin  10mg , resumed in hospital LDL 83, goal < 70 Increase to 40 mg Continue statin at discharge   Former Tobacco Abuse Noted    Dysphagia Patient has post-stroke dysphagia, SLP consulted       Diet    Diet Heart Room service appropriate? Yes; Fluid consistency: Thin        Advance diet as tolerated   Other Stroke Risk Factors ETOH use, advised to drink no more than 2 drink(s) a day   Other Active Problems GERD Vertigo   REVIEW OF SYSTEMS: Out of a complete 14 system review of symptoms, the patient complains only of the following symptoms, and all other reviewed systems are negative.  ALLERGIES: No Known Allergies  HOME MEDICATIONS: Outpatient Medications Prior to Visit  Medication Sig Dispense Refill   apixaban  (ELIQUIS ) 5 MG TABS tablet Take 1 tablet (5 mg total) by mouth 2 (two) times daily. 60 tablet 2   atorvastatin  (LIPITOR) 40 MG tablet Take 1 tablet (40 mg total) by mouth every evening. 30 tablet 0   docusate sodium  (COLACE) 100 MG capsule Take 100 mg by  mouth daily. (Patient not taking: Reported on 11/13/2023)     finasteride  (PROSCAR ) 5 MG tablet Take 5 mg by mouth daily.     ramipril  (ALTACE ) 10 MG capsule Take 10 mg by mouth every morning.      Vitamin D , Ergocalciferol , (DRISDOL) 1.25 MG (50000 UNIT) CAPS capsule Take 50,000 Units by mouth 2 (two) times a week.     No  facility-administered medications prior to visit.    PAST MEDICAL HISTORY: Past Medical History:  Diagnosis Date   Arthritis    Chronic lower back pain    Complication of anesthesia    blood pressure dropped low in dentist office during dental work- one 1 time   DOE (dyspnea on exertion)    Dysrhythmia    Hx. intermittent Atrial Fibrilllation   GERD (gastroesophageal reflux disease)    04/30/21- not current   Hepatitis B ~ 2013   suspected; [facility]   High cholesterol    Hypertension    New onset atrial fibrillation (HCC) 07/05/2013   Shortness of breath    Stroke (HCC) ~ 2012   residual right side decreased sensation   TIA (transient ischemic attack) 03/07/2010   of unknown origin/notes 07/05/2013; I've had 3 or 4; call it stroke sometimes; TIA sometimes   Vertigo 07/05/2013    PAST SURGICAL HISTORY: Past Surgical History:  Procedure Laterality Date   ANTERIOR CERVICAL DECOMP/DISCECTOMY FUSION N/A 05/03/2021   Procedure: ANTERIOR CERVICAL DECOMPRESSION /DISCECTOMY CORTEZ MESSICK PROSTHESIS ,PLATE/SCREWS CERVICAL FOUR- FIVE, CERVICAL FIVE- SIX, CERVICAL SIX- SEVEN;  Surgeon: Mavis Purchase, MD;  Location: [facility] OR;  Service: Neurosurgery;  Laterality: N/A;   CAPSULOTOMY  04/03/2012   Procedure: MINOR CAPSULOTOMY;  Surgeon: Debby FORBES Sharalyn Mickey., MD;  Location: [facility] OR;  Service: Ophthalmology;  Laterality: Right;   CATARACT EXTRACTION W/PHACO  02/01/2012   Procedure: CATARACT EXTRACTION PHACO AND INTRAOCULAR LENS PLACEMENT (IOC);  Surgeon: Jestine Bunnell, MD;  Location: [facility] OR;  Service: Ophthalmology;  Laterality: Right;   COLONOSCOPY WITH PROPOFOL  N/A 11/28/2013   Procedure: COLONOSCOPY WITH PROPOFOL ;  Surgeon: Gladis MARLA Louder, MD;  Location: [facility] ENDOSCOPY;  Service: Endoscopy;  Laterality: N/A;   ESOPHAGOGASTRODUODENOSCOPY (EGD) WITH PROPOFOL  N/A 11/28/2013   Procedure: ESOPHAGOGASTRODUODENOSCOPY (EGD) WITH PROPOFOL ;  Surgeon: Gladis MARLA Louder, MD;  Location:  [facility] ENDOSCOPY;  Service: Endoscopy;  Laterality: N/A;   EYE SURGERY     YAG LASER APPLICATION  04/03/2012   Procedure: YAG LASER APPLICATION;  Surgeon: Debby FORBES Sharalyn Mickey., MD;  Location: [facility] OR;  Service: Ophthalmology;  Laterality: N/A;    FAMILY HISTORY: No family history on file.  SOCIAL HISTORY: Social History   Socioeconomic History   Marital status: Married    Spouse name: Not on file   Number of children: Not on file   Years of education: Not on file   Highest education level: Not on file  Occupational History   Not on file  Tobacco Use   Smoking status: Former    Current packs/day: 0.00    Types: Cigarettes    Quit date: 16    Years since quitting: 57.8   Smokeless tobacco: Never   Tobacco comments:    Smoked in college  a little  Vaping Use   Vaping status: Never Used  Substance and Sexual Activity   Alcohol use: Yes    Comment: Beer- ocassional   Drug use: Never   Sexual activity: Not Currently  Other Topics Concern   Not on file  Social History Narrative  Not on file   Social Drivers of Health   Financial Resource Strain: Not on file  Food Insecurity: No Food Insecurity (11/13/2023)   Hunger Vital Sign    Worried About Running Out of Food in the Last Year: Never true    Ran Out of Food in the Last Year: Never true  Transportation Needs: No Transportation Needs (11/13/2023)   PRAPARE - Administrator, Civil Service (Medical): No    Lack of Transportation (Non-Medical): No  Physical Activity: Not on file  Stress: Not on file  Social Connections: Unknown (11/13/2023)   Social Connection and Isolation Panel    Frequency of Communication with Friends and Family: More than three times a week    Frequency of Social Gatherings with Friends and Family: More than three times a week    Attends Religious Services: Patient unable to answer    Active Member of Clubs or Organizations: Patient unable to answer    Attends Banker Meetings:  Patient unable to answer    Marital Status: Patient declined  Intimate Partner Violence: Not At Risk (11/13/2023)   Humiliation, Afraid, Rape, and Kick questionnaire    Fear of Current or Ex-Partner: No    Emotionally Abused: No    Physically Abused: No    Sexually Abused: No      PHYSICAL EXAM  There were no vitals filed for this visit. There is no height or weight on file to calculate BMI.  Generalized: Well developed, in no acute distress   Neurological examination  Mentation: Alert oriented to time, place, history taking. Follows all commands speech and language fluent Cranial nerve II-XII: Pupils were equal round reactive to light. Extraocular movements were full, visual field were full on confrontational test. Facial sensation and strength were normal. Uvula tongue midline. Head turning and shoulder shrug  were normal and symmetric. Motor: The motor testing reveals 5 over 5 strength of all 4 extremities. Good symmetric motor tone is noted throughout.  Sensory: Sensory testing is intact to soft touch on all 4 extremities. No evidence of extinction is noted.  Coordination: Cerebellar testing reveals good finger-nose-finger and heel-to-shin bilaterally.  Gait and station: Gait is normal. Tandem gait is normal. Romberg is negative. No drift is seen.  Reflexes: Deep tendon reflexes are symmetric and normal bilaterally.   DIAGNOSTIC DATA (LABS, IMAGING, TESTING) - I reviewed patient records, labs, notes, testing and imaging myself where available.  Lab Results  Component Value Date   WBC 9.3 11/12/2023   HGB 13.0 11/12/2023   HCT 40.6 11/12/2023   MCV 87.3 11/12/2023   PLT 107 (L) 11/12/2023      Component Value Date/Time   NA 140 11/12/2023 1811   K 4.1 11/12/2023 1811   CL 108 11/12/2023 1811   CO2 24 11/12/2023 1811   GLUCOSE 108 (H) 11/12/2023 1811   BUN 21 11/12/2023 1811   CREATININE 0.97 11/12/2023 1811   CALCIUM  8.5 (L) 11/12/2023 1811   PROT 6.2 (L) 11/12/2023  1811   ALBUMIN 3.2 (L) 11/12/2023 1811   AST 23 11/12/2023 1811   ALT 14 11/12/2023 1811   ALKPHOS 45 11/12/2023 1811   BILITOT 0.7 11/12/2023 1811   GFRNONAA >60 11/12/2023 1811   GFRAA 69 (L) 07/06/2013 0317   Lab Results  Component Value Date   CHOL 141 11/13/2023   HDL 54 11/13/2023   LDLCALC 83 11/13/2023   TRIG 18 11/13/2023   CHOLHDL 2.6 11/13/2023   Lab Results  Component Value Date   HGBA1C 5.6 11/13/2023   No results found for: VITAMINB12 Lab Results  Component Value Date   TSH 1.276 04/28/2021      ASSESSMENT AND PLAN 89 y.o. year old male  has a past medical history of Arthritis, Chronic lower back pain, Complication of anesthesia, DOE (dyspnea on exertion), Dysrhythmia, GERD (gastroesophageal reflux disease), Hepatitis B (~ 2013), High cholesterol, Hypertension, New onset atrial fibrillation (HCC) (07/05/2013), Shortness of breath, Stroke (HCC) (~ 2012), TIA (transient ischemic attack) (03/07/2010), and Vertigo (07/05/2013). here with ***     Continue {anticoagulants:31417}  and ***  for secondary stroke prevention.   Discussed secondary stroke prevention measures and importance of close PCP follow up for aggressive stroke risk factor management. I have gone over the pathophysiology of stroke, warning signs and symptoms, risk factors and their management in some detail with instructions to go to the closest emergency room for symptoms of concern. HTN: BP goal <130/90.  Stable on *** per PCP HLD: LDL goal <70. Recent LDL ***.  DMII: A1c goal<7.0. Recent A1c ***.  Encouraged patient to monitor diet and encouraged exercise FU with our office ***  No orders of the defined types were placed in this encounter.  No orders of the defined types were placed in this encounter.     Duwaine Russell, MSN, NP-C 12/10/2023, 3:50 PM [facility] [address], [address] [place], [place] [address] [phone]

## 2023-12-11 ENCOUNTER — Inpatient Hospital Stay: Admitting: Adult Health

## 2023-12-21 ENCOUNTER — Telehealth: Payer: Self-pay | Admitting: Family Medicine

## 2023-12-21 NOTE — Telephone Encounter (Signed)
 Patient reschedule appointment due to having the flu.

## 2023-12-21 NOTE — Progress Notes (Signed)
 [facility] [address] [place]. [place] [address] [phone]       HOSPITAL FOLLOW UP NOTE  [name] Date of Birth: November 07, 1933 Medical Record Number: [id number]   Reason for Referral:  hospital stroke follow up    SUBJECTIVE:   CHIEF COMPLAINT:  Chief Complaint  Patient presents with   RM1/STROKE    Pt is here with his Son. Pt states that he isn't doing very good,but he is managing.     HPI:   Terry Gray is a 89 y.o. who  has a past medical history of Arthritis, Chronic lower back pain, Complication of anesthesia, DOE (dyspnea on exertion), Dysrhythmia, GERD (gastroesophageal reflux disease), Hepatitis B (~ 2013), High cholesterol, Hypertension, New onset atrial fibrillation (HCC) (07/05/2013), Shortness of breath, Stroke (HCC) (~ 2012), TIA (transient ischemic attack) (03/07/2010), and Vertigo (07/05/2013).  Patient presented on 9/7 with complaints of dizziness and headache that started on 9/6 around 1700 which spontaneously improved. He developed the dizziness yet again with some nausea and vomiting on 9/7 which prompted him to come to the ED via EMS. CT negative. CTA no LVO, severe left and mild right vertebral artery stenosis. MRI showed small acute infarct in periventricular left posterior temporal lobe. LDL 83. Atorvastatin  increased to 40mg . He was started on asa and Plavix  but switched to Eliquis  at discharge due to history of afib. HH therapy recommended. Personally reviewed hospitalization pertinent progress notes, lab work and imaging.  Evaluated by Dr Rosemarie.   Since discharge, he reports doing fair. He was diagnosed with influenza shortly after discharge. He feels he and his family are recovering well. He is working with PT. Now once weekly. He has bilateral foot drop s/p ACDF in 2023. He wears foot drop braces. No falls since being home. 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   He continues atorvastatin  and Eliquis . He is tolerating well. No unusual  bleeding. He has follow up with PCP next week. BP typically 130s/70s. He checks it regularly at home.   He is eating less. Has good appetite. He has always had trouble sleeping. He wakes multiple times at night. He does snore. Occasional morning headaches. Son has sleep apnea. He has PTSD from living in [place]. He moved to [place]  in 1980s. 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 He feels memory is good. He lives with his wife, two daughters (has 8 children), grandson, granddaughter and great granddaughter.   He was seen by PCP 11/14/2023. He stopped smoking 50 years ago.    PERTINENT IMAGING/LABS  CT head No acute abnormality. Small vessel disease.  CTA head & neck NO LVO. Severe left and mild right vertebral artery origin stenosis.  MRI  Small acute infarct in the periventricular left posterior temporal lobe.  Moderate chronic microvascular ischemic disease 2D Echo ordered  Loop recorder requested at discharge   A1C Lab Results  Component Value Date   HGBA1C 5.6 11/13/2023    Lipid Panel     Component Value Date/Time   CHOL 141 11/13/2023 0433   TRIG 18 11/13/2023 0433   HDL 54 11/13/2023 0433   CHOLHDL 2.6 11/13/2023 0433   VLDL 4 11/13/2023 0433   LDLCALC 83 11/13/2023 0433      ROS:   14 system review of systems performed and negative with exception of those listed in HPI  PMH:  Past Medical History:  Diagnosis Date   Arthritis    Chronic lower back pain    Complication of anesthesia  blood pressure dropped low in dentist office during dental work- one 1 time   DOE (dyspnea on exertion)    Dysrhythmia    Hx. intermittent Atrial Fibrilllation   GERD (gastroesophageal reflux disease)    04/30/21- not current   Hepatitis B ~ 2013   suspected; [facility]   High cholesterol    Hypertension    New onset atrial fibrillation (HCC) 07/05/2013   Shortness of breath    Stroke (HCC) ~ 2012   residual right side decreased sensation   TIA (transient ischemic  attack) 03/07/2010   of unknown origin/notes 07/05/2013; I've had 3 or 4; call it stroke sometimes; TIA sometimes   Vertigo 07/05/2013    PSH:  Past Surgical History:  Procedure Laterality Date   ANTERIOR CERVICAL DECOMP/DISCECTOMY FUSION N/A 05/03/2021   Procedure: ANTERIOR CERVICAL DECOMPRESSION /DISCECTOMY CORTEZ MESSICK PROSTHESIS ,PLATE/SCREWS CERVICAL FOUR- FIVE, CERVICAL FIVE- SIX, CERVICAL SIX- SEVEN;  Surgeon: Mavis Purchase, MD;  Location: [facility] OR;  Service: Neurosurgery;  Laterality: N/A;   CAPSULOTOMY  04/03/2012   Procedure: MINOR CAPSULOTOMY;  Surgeon: Debby FORBES Sharalyn Mickey., MD;  Location: [facility] OR;  Service: Ophthalmology;  Laterality: Right;   CATARACT EXTRACTION W/PHACO  02/01/2012   Procedure: CATARACT EXTRACTION PHACO AND INTRAOCULAR LENS PLACEMENT (IOC);  Surgeon: Jestine Bunnell, MD;  Location: [facility] OR;  Service: Ophthalmology;  Laterality: Right;   COLONOSCOPY WITH PROPOFOL  N/A 11/28/2013   Procedure: COLONOSCOPY WITH PROPOFOL ;  Surgeon: Gladis MARLA Louder, MD;  Location: [facility];  Service: Endoscopy;  Laterality: N/A;   ESOPHAGOGASTRODUODENOSCOPY (EGD) WITH PROPOFOL  N/A 11/28/2013   Procedure: ESOPHAGOGASTRODUODENOSCOPY (EGD) WITH PROPOFOL ;  Surgeon: Gladis MARLA Louder, MD;  Location: [facility];  Service: Endoscopy;  Laterality: N/A;   EYE SURGERY     YAG LASER APPLICATION  04/03/2012   Procedure: YAG LASER APPLICATION;  Surgeon: Debby FORBES Sharalyn Mickey., MD;  Location: [facility] OR;  Service: Ophthalmology;  Laterality: N/A;    Social History:  Social History   Socioeconomic History   Marital status: Married    Spouse name: Not on file   Number of children: Not on file   Years of education: Not on file   Highest education level: Not on file  Occupational History   Not on file  Tobacco Use   Smoking status: Former    Current packs/day: 0.00    Types: Cigarettes    Quit date: 66    Years since quitting: 57.8   Smokeless tobacco: Never   Tobacco comments:    Smoked  in college  a little  Vaping Use   Vaping status: Never Used  Substance and Sexual Activity   Alcohol use: Yes    Comment: Beer- ocassional   Drug use: Never   Sexual activity: Not Currently  Other Topics Concern   Not on file  Social History Narrative   Not on file   Social Drivers of Health   Financial Resource Strain: Not on file  Food Insecurity: No Food Insecurity (11/13/2023)   Hunger Vital Sign    Worried About Running Out of Food in the Last Year: Never true    Ran Out of Food in the Last Year: Never true  Transportation Needs: No Transportation Needs (11/13/2023)   PRAPARE - Administrator, Civil Service (Medical): No    Lack of Transportation (Non-Medical): No  Physical Activity: Not on file  Stress: Not on file  Social Connections: Unknown (11/13/2023)   Social Connection and Isolation Panel  Frequency of Communication with Friends and Family: More than three times a week    Frequency of Social Gatherings with Friends and Family: More than three times a week    Attends Religious Services: Patient unable to answer    Active Member of Clubs or Organizations: Patient unable to answer    Attends Banker Meetings: Patient unable to answer    Marital Status: Patient declined  Intimate Partner Violence: Not At Risk (11/13/2023)   Humiliation, Afraid, Rape, and Kick questionnaire    Fear of Current or Ex-Partner: No    Emotionally Abused: No    Physically Abused: No    Sexually Abused: No    Family History: History reviewed. No pertinent family history.  Medications:   Current Outpatient Medications on File Prior to Visit  Medication Sig Dispense Refill   apixaban  (ELIQUIS ) 5 MG TABS tablet Take 1 tablet (5 mg total) by mouth 2 (two) times daily. 60 tablet 2   atorvastatin  (LIPITOR) 40 MG tablet Take 1 tablet (40 mg total) by mouth every evening. 30 tablet 0   finasteride  (PROSCAR ) 5 MG tablet Take 5 mg by mouth daily.     ramipril  (ALTACE ) 10 MG  capsule Take 10 mg by mouth every morning.      Vitamin D , Ergocalciferol , (DRISDOL) 1.25 MG (50000 UNIT) CAPS capsule Take 50,000 Units by mouth 2 (two) times a week.     docusate sodium  (COLACE) 100 MG capsule Take 100 mg by mouth daily. (Patient not taking: Reported on 01/01/2024)     No current facility-administered medications on file prior to visit.    Allergies:  No Known Allergies    OBJECTIVE:  Physical Exam  Vitals:   01/01/24 0841  BP: (!) 154/80  Pulse: 67  SpO2: 97%  Weight: 148 lb (67.1 kg)  Height: 5' 2 (1.575 m)   Body mass index is 27.07 kg/m. No results found.      No data to display           General: well developed, well nourished, seated, in no evident distress Head: head normocephalic and atraumatic.   Neck: supple with no carotid or supraclavicular bruits Cardiovascular: regular rate and rhythm, no murmurs Musculoskeletal: no deformity Skin:  no rash/petichiae Vascular:  Normal pulses all extremities   Neurologic Exam Mental Status: Awake and fully alert.  Fluent speech and language.  Oriented to place and time. Recent and remote memory intact. Attention span, concentration and fund of knowledge appropriate. Mood and affect appropriate.  Cranial Nerves: Fundoscopic exam reveals sharp disc margins. Pupils equal, briskly reactive to light. Extraocular movements full without nystagmus. Visual fields full to confrontation. Hearing intact. Facial sensation intact. Face, tongue, palate moves normally and symmetrically.  Motor: Normal bulk and tone. Normal strength in all tested extremity muscles Sensory.: intact to touch , pinprick , position and vibratory sensation.  Coordination: Rapid alternating movements normal in all extremities. Finger-to-nose and heel-to-shin performed accurately bilaterally. Gait and Station: Arises from chair with minimal difficulty. Uses Rolator for support. Stance is slightly stooped. Gait is spastic, stable with Rolator.  Unable to tandem.     NIHSS  1 Modified Rankin  0    ASSESSMENT: Terry Gray is a 89 y.o. year old male presenting to the ER with dizziness and headache. Vascular risk factors include HTN, HLD, afib, hx tobacco use.      PLAN:  Acute Ischemic Infarct:  left posterior temporal lobe Etiology:  small vessel disease, remote history  of A-fib in 2015: Residual deficit: right sided weakness. Continue Eliquis  (apixaban ) daily and atorvastatin  40mg  daily for secondary stroke prevention. Discussed secondary stroke prevention measures and importance of close PCP follow up for aggressive stroke risk factor management. I have gone over the pathophysiology of stroke, warning signs and symptoms, risk factors and their management in some detail with instructions to go to the closest emergency room for symptoms of concern. HTN: BP goal <130/90. Elevated in the office but home readings are 130s/70s. Stable on ramipril  10mg  daily. Continue per PCP HLD: LDL goal <70. Recent LDL 83. Continue atorvastatin  40mg  daily per PCP.  DMII: A1c goal<7.0. Recent A1c 5.6. Not diabetic. Continue healthy lifestyle habits with well balanced diet and regular exercise.  Atrial fib: Continue Eliquis  as directed by PCP.  Concerns of sleep apnea: will order sleep study. Please listen for call from sleep lab to schedule sleep study.  Right sided weakness: continue working with PT. Use Rolator for gait stability.     Follow up pending sleep evaluation   CC:  [facility] provider: Dr. Rosemarie PCP: Husain, Karrar, MD    I spent 45 minutes of face-to-face and non-face-to-face time with patient.  This included previsit chart review including review of recent hospitalization, lab review, study review, order entry, electronic health record documentation, patient education regarding recent stroke including etiology, secondary stroke prevention measures and importance of managing stroke risk factors, residual deficits and typical recovery  time and answered all other questions to patient satisfaction   Greig Forbes, V [facility]  [facility] [address] [place], [place] [address]  Phone [phone] Fax [phone] Note: This document was prepared with digital dictation and possible smart phrase technology. Any transcriptional errors that result from this process are unintentional.

## 2023-12-21 NOTE — Progress Notes (Deleted)
 [facility] [address] [place]. [place] [address] [phone]       HOSPITAL FOLLOW UP NOTE  [name] Date of Birth: Aug 22, 1933 Medical Record Number: [id number]   Reason for Referral:  hospital stroke follow up    SUBJECTIVE:   CHIEF COMPLAINT:  No chief complaint on file.   HPI:   Terry Gray is a 89 y.o. who  has a past medical history of Arthritis, Chronic lower back pain, Complication of anesthesia, DOE (dyspnea on exertion), Dysrhythmia, GERD (gastroesophageal reflux disease), Hepatitis B (~ 2013), High cholesterol, Hypertension, New onset atrial fibrillation (HCC) (07/05/2013), Shortness of breath, Stroke (HCC) (~ 2012), TIA (transient ischemic attack) (03/07/2010), and Vertigo (07/05/2013).  Patient presented on 9/7 with complaints of dizziness and headache that started on 9/6 around 1700 which spontaneously improved. He developed the dizziness yet again with some nausea and vomiting on 9/7 which prompted him to come to the ED via EMS. CT negative. CTA no LVO, severe left and mild right vertebral artery stenosis. MRI showed small acute infarct in periventricular left posterior temporal lobe. LDL 83. Atorvastatin  increased to 40mg . He was started on asa and Plavix  but switched to Eliquis  at discharge due to history of afib. HH therapy recommended. Personally reviewed hospitalization pertinent progress notes, lab work and imaging.  Evaluated by Dr Rosemarie.   Since discharge,   Therapy?  Eliquis  Atorvastatin   He was seen by PCP 11/14/2023.      PERTINENT IMAGING/LABS  CT head No acute abnormality. Small vessel disease.  CTA head & neck NO LVO. Severe left and mild right vertebral artery origin stenosis.  MRI  Small acute infarct in the periventricular left posterior temporal lobe.  Moderate chronic microvascular ischemic disease 2D Echo ordered  Loop recorder requested at discharge   A1C Lab Results  Component Value Date    HGBA1C 5.6 11/13/2023    Lipid Panel     Component Value Date/Time   CHOL 141 11/13/2023 0433   TRIG 18 11/13/2023 0433   HDL 54 11/13/2023 0433   CHOLHDL 2.6 11/13/2023 0433   VLDL 4 11/13/2023 0433   LDLCALC 83 11/13/2023 0433      ROS:   14 system review of systems performed and negative with exception of those listed in HPI  PMH:  Past Medical History:  Diagnosis Date   Arthritis    Chronic lower back pain    Complication of anesthesia    blood pressure dropped low in dentist office during dental work- one 1 time   DOE (dyspnea on exertion)    Dysrhythmia    Hx. intermittent Atrial Fibrilllation   GERD (gastroesophageal reflux disease)    04/30/21- not current   Hepatitis B ~ 2013   suspected; [facility]   High cholesterol    Hypertension    New onset atrial fibrillation (HCC) 07/05/2013   Shortness of breath    Stroke (HCC) ~ 2012   residual right side decreased sensation   TIA (transient ischemic attack) 03/07/2010   of unknown origin/notes 07/05/2013; I've had 3 or 4; call it stroke sometimes; TIA sometimes   Vertigo 07/05/2013    PSH:  Past Surgical History:  Procedure Laterality Date   ANTERIOR CERVICAL DECOMP/DISCECTOMY FUSION N/A 05/03/2021   Procedure: ANTERIOR CERVICAL DECOMPRESSION /DISCECTOMY CORTEZ MESSICK PROSTHESIS ,PLATE/SCREWS CERVICAL FOUR- FIVE, CERVICAL FIVE- SIX, CERVICAL SIX- SEVEN;  Surgeon: Mavis Purchase, MD;  Location: [facility] OR;  Service: Neurosurgery;  Laterality: N/A;   CAPSULOTOMY  04/03/2012   Procedure: MINOR CAPSULOTOMY;  Surgeon: Debby FORBES Sharalyn Mickey., MD;  Location: [facility] OR;  Service: Ophthalmology;  Laterality: Right;   CATARACT EXTRACTION W/PHACO  02/01/2012   Procedure: CATARACT EXTRACTION PHACO AND INTRAOCULAR LENS PLACEMENT (IOC);  Surgeon: Jestine Bunnell, MD;  Location: [facility] OR;  Service: Ophthalmology;  Laterality: Right;   COLONOSCOPY WITH PROPOFOL  N/A 11/28/2013   Procedure: COLONOSCOPY WITH PROPOFOL ;   Surgeon: Gladis MARLA Louder, MD;  Location: [facility] ENDOSCOPY;  Service: Endoscopy;  Laterality: N/A;   ESOPHAGOGASTRODUODENOSCOPY (EGD) WITH PROPOFOL  N/A 11/28/2013   Procedure: ESOPHAGOGASTRODUODENOSCOPY (EGD) WITH PROPOFOL ;  Surgeon: Gladis MARLA Louder, MD;  Location: [facility]COPY;  Service: Endoscopy;  Laterality: N/A;   EYE SURGERY     YAG LASER APPLICATION  04/03/2012   Procedure: YAG LASER APPLICATION;  Surgeon: Debby FORBES Sharalyn Mickey., MD;  Location: [facility] OR;  Service: Ophthalmology;  Laterality: N/A;    Social History:  Social History   Socioeconomic History   Marital status: Married    Spouse name: Not on file   Number of children: Not on file   Years of education: Not on file   Highest education level: Not on file  Occupational History   Not on file  Tobacco Use   Smoking status: Former    Current packs/day: 0.00    Types: Cigarettes    Quit date: 60    Years since quitting: 57.8   Smokeless tobacco: Never   Tobacco comments:    Smoked in college  a little  Vaping Use   Vaping status: Never Used  Substance and Sexual Activity   Alcohol use: Yes    Comment: Beer- ocassional   Drug use: Never   Sexual activity: Not Currently  Other Topics Concern   Not on file  Social History Narrative   Not on file   Social Drivers of Health   Financial Resource Strain: Not on file  Food Insecurity: No Food Insecurity (11/13/2023)   Hunger Vital Sign    Worried About Running Out of Food in the Last Year: Never true    Ran Out of Food in the Last Year: Never true  Transportation Needs: No Transportation Needs (11/13/2023)   PRAPARE - Administrator, Civil Service (Medical): No    Lack of Transportation (Non-Medical): No  Physical Activity: Not on file  Stress: Not on file  Social Connections: Unknown (11/13/2023)   Social Connection and Isolation Panel    Frequency of Communication with Friends and Family: More than three times a week    Frequency of Social Gatherings with  Friends and Family: More than three times a week    Attends Religious Services: Patient unable to answer    Active Member of Clubs or Organizations: Patient unable to answer    Attends Banker Meetings: Patient unable to answer    Marital Status: Patient declined  Intimate Partner Violence: Not At Risk (11/13/2023)   Humiliation, Afraid, Rape, and Kick questionnaire    Fear of Current or Ex-Partner: No    Emotionally Abused: No    Physically Abused: No    Sexually Abused: No    Family History: No family history on file.  Medications:   Current Outpatient Medications on File Prior to Visit  Medication Sig Dispense Refill   apixaban  (ELIQUIS ) 5 MG TABS tablet Take 1 tablet (5 mg total) by mouth 2 (two) times daily. 60 tablet 2   atorvastatin  (LIPITOR) 40 MG tablet Take 1 tablet (40 mg  total) by mouth every evening. 30 tablet 0   docusate sodium  (COLACE) 100 MG capsule Take 100 mg by mouth daily. (Patient not taking: Reported on 11/13/2023)     finasteride  (PROSCAR ) 5 MG tablet Take 5 mg by mouth daily.     ramipril  (ALTACE ) 10 MG capsule Take 10 mg by mouth every morning.      Vitamin D , Ergocalciferol , (DRISDOL) 1.25 MG (50000 UNIT) CAPS capsule Take 50,000 Units by mouth 2 (two) times a week.     No current facility-administered medications on file prior to visit.    Allergies:  No Known Allergies    OBJECTIVE:  Physical Exam  There were no vitals filed for this visit. There is no height or weight on file to calculate BMI. No results found.      No data to display           General: well developed, well nourished, seated, in no evident distress Head: head normocephalic and atraumatic.   Neck: supple with no carotid or supraclavicular bruits Cardiovascular: regular rate and rhythm, no murmurs Musculoskeletal: no deformity Skin:  no rash/petichiae Vascular:  Normal pulses all extremities   Neurologic Exam Mental Status: Awake and fully alert.  Fluent  speech and language.  Oriented to place and time. Recent and remote memory intact. Attention span, concentration and fund of knowledge appropriate. Mood and affect appropriate.  Cranial Nerves: Fundoscopic exam reveals sharp disc margins. Pupils equal, briskly reactive to light. Extraocular movements full without nystagmus. Visual fields full to confrontation. Hearing intact. Facial sensation intact. Face, tongue, palate moves normally and symmetrically.  Motor: Normal bulk and tone. Normal strength in all tested extremity muscles Sensory.: intact to touch , pinprick , position and vibratory sensation.  Coordination: Rapid alternating movements normal in all extremities. Finger-to-nose and heel-to-shin performed accurately bilaterally. Gait and Station: Arises from chair without difficulty. Stance is normal. Gait demonstrates normal stride length and balance with ***. Tandem walk and heel toe ***.  Reflexes: 1+ and symmetric.    NIHSS  *** Modified Rankin  ***    ASSESSMENT: Terry Gray is a 89 y.o. year old male presenting to the ER with dizziness and headache. Vascular risk factors include HTN, HLD, afib, tobacco use.      PLAN:  Acute Ischemic Infarct:  left posterior temporal lobe Etiology:  small vessel disease, remote history of A-fib in 2015: Residual deficit: ***. Continue Eliquis  (apixaban ) daily and atorvastatin  40mg  daily for secondary stroke prevention. Discussed secondary stroke prevention measures and importance of close PCP follow up for aggressive stroke risk factor management. I have gone over the pathophysiology of stroke, warning signs and symptoms, risk factors and their management in some detail with instructions to go to the closest emergency room for symptoms of concern. HTN: BP goal <130/90.  Stable on ramipril  10mg  daily. Continue per PCP HLD: LDL goal <70. Recent LDL 83. Continue atorvastatin  40mg  daily per PCP.  DMII: A1c goal<7.0. Recent A1c 5.6. Not diabetic.  Continue healthy lifestyle habits with well balanced diet and regular exercise.  Atrial fib: Continue Eliquis  as directed by PCP.  Tobacco use: cessation advised.    Follow up in *** or call earlier if needed   CC:  [facility] provider: Dr. Rosemarie PCP: Ransom Other, MD    I spent *** minutes of face-to-face and non-face-to-face time with patient.  This included previsit chart review including review of recent hospitalization, lab review, study review, order entry, electronic health record documentation, patient education  regarding recent stroke including etiology, secondary stroke prevention measures and importance of managing stroke risk factors, residual deficits and typical recovery time and answered all other questions to patient satisfaction   Greig Forbes, [facility]  [facility] [address] [place], [place] [address]  Phone [phone] Fax [phone] Note: This document was prepared with digital dictation and possible smart phrase technology. Any transcriptional errors that result from this process are unintentional.

## 2023-12-25 ENCOUNTER — Inpatient Hospital Stay: Admitting: Family Medicine

## 2023-12-27 NOTE — Patient Instructions (Signed)
 Below is our plan:  Acute Ischemic Infarct:  left posterior temporal lobe Etiology:  small vessel disease, remote history of A-fib in 2015: Residual deficit: right sided weakness. Continue Eliquis  (apixaban ) daily and atorvastatin  40mg  daily for secondary stroke prevention. Discussed secondary stroke prevention measures and importance of close PCP follow up for aggressive stroke risk factor management. I have gone over the pathophysiology of stroke, warning signs and symptoms, risk factors and their management in some detail with instructions to go to the closest emergency room for symptoms of concern. HTN: BP goal <130/90. Elevated in the office but home readings are 130s/70s. Stable on ramipril  10mg  daily. Continue per PCP HLD: LDL goal <70. Recent LDL 83. Continue atorvastatin  40mg  daily per PCP.  DMII: A1c goal<7.0. Recent A1c 5.6. Not diabetic. Continue healthy lifestyle habits with well balanced diet and regular exercise.  Atrial fib: Continue Eliquis  as directed by PCP.  Concerns of sleep apnea: will order sleep study. Please listen for call from sleep lab to schedule sleep study.   Goals:  1) Maintain strict control of hypertension with blood pressure goal below 130/90 2) Maintain good control of diabetes with hemoglobin A1c goal below 7%  3) Maintain good control of lipids with LDL cholesterol goal below 70 mg/dL.  4) Eat a healthy diet with plenty of whole grains, cereals, fruits and vegetables, exercise regularly and maintain ideal body weight   Resources: [email or web address].biz/  Please make sure you are staying well hydrated. I recommend 50-60 ounces daily. Well balanced diet and regular exercise encouraged. Consistent sleep schedule with 6-8 hours recommended.   Please continue follow up with care team as directed.   Follow up with me pending sleep study   You may receive a survey regarding today's visit. I  encourage you to leave honest feed back as I do use this information to improve patient care. Thank you for seeing me today!

## 2024-01-01 ENCOUNTER — Encounter: Payer: Self-pay | Admitting: Family Medicine

## 2024-01-01 ENCOUNTER — Ambulatory Visit: Admitting: Family Medicine

## 2024-01-01 VITALS — BP 154/80 | HR 67 | Ht 62.0 in | Wt 148.0 lb

## 2024-01-01 DIAGNOSIS — I639 Cerebral infarction, unspecified: Secondary | ICD-10-CM | POA: Diagnosis not present

## 2024-01-01 DIAGNOSIS — E782 Mixed hyperlipidemia: Secondary | ICD-10-CM | POA: Insufficient documentation

## 2024-01-01 DIAGNOSIS — I10 Essential (primary) hypertension: Secondary | ICD-10-CM | POA: Insufficient documentation

## 2024-01-01 DIAGNOSIS — Z8673 Personal history of transient ischemic attack (TIA), and cerebral infarction without residual deficits: Secondary | ICD-10-CM | POA: Insufficient documentation

## 2024-01-01 DIAGNOSIS — R269 Unspecified abnormalities of gait and mobility: Secondary | ICD-10-CM | POA: Insufficient documentation

## 2024-01-01 DIAGNOSIS — M519 Unspecified thoracic, thoracolumbar and lumbosacral intervertebral disc disorder: Secondary | ICD-10-CM | POA: Insufficient documentation

## 2024-01-01 DIAGNOSIS — H3530 Unspecified macular degeneration: Secondary | ICD-10-CM | POA: Insufficient documentation

## 2024-01-01 DIAGNOSIS — K5900 Constipation, unspecified: Secondary | ICD-10-CM | POA: Insufficient documentation

## 2024-01-03 DIAGNOSIS — I10 Essential (primary) hypertension: Secondary | ICD-10-CM | POA: Diagnosis not present

## 2024-01-03 DIAGNOSIS — G459 Transient cerebral ischemic attack, unspecified: Secondary | ICD-10-CM | POA: Diagnosis not present

## 2024-01-04 ENCOUNTER — Telehealth: Payer: Self-pay | Admitting: Family Medicine

## 2024-01-04 NOTE — Telephone Encounter (Signed)
 HST- HTA pending

## 2024-01-05 DIAGNOSIS — E782 Mixed hyperlipidemia: Secondary | ICD-10-CM | POA: Diagnosis not present

## 2024-01-05 DIAGNOSIS — M1990 Unspecified osteoarthritis, unspecified site: Secondary | ICD-10-CM | POA: Diagnosis not present

## 2024-01-05 DIAGNOSIS — N400 Enlarged prostate without lower urinary tract symptoms: Secondary | ICD-10-CM | POA: Diagnosis not present

## 2024-01-05 DIAGNOSIS — G459 Transient cerebral ischemic attack, unspecified: Secondary | ICD-10-CM | POA: Diagnosis not present

## 2024-01-05 DIAGNOSIS — I10 Essential (primary) hypertension: Secondary | ICD-10-CM | POA: Diagnosis not present

## 2024-01-08 DIAGNOSIS — I480 Paroxysmal atrial fibrillation: Secondary | ICD-10-CM | POA: Diagnosis not present

## 2024-01-08 DIAGNOSIS — I10 Essential (primary) hypertension: Secondary | ICD-10-CM | POA: Diagnosis not present

## 2024-01-08 DIAGNOSIS — Z8673 Personal history of transient ischemic attack (TIA), and cerebral infarction without residual deficits: Secondary | ICD-10-CM | POA: Diagnosis not present

## 2024-01-08 DIAGNOSIS — Z0000 Encounter for general adult medical examination without abnormal findings: Secondary | ICD-10-CM | POA: Diagnosis not present

## 2024-01-08 DIAGNOSIS — N400 Enlarged prostate without lower urinary tract symptoms: Secondary | ICD-10-CM | POA: Diagnosis not present

## 2024-01-08 DIAGNOSIS — K5900 Constipation, unspecified: Secondary | ICD-10-CM | POA: Diagnosis not present

## 2024-01-08 DIAGNOSIS — Z1331 Encounter for screening for depression: Secondary | ICD-10-CM | POA: Diagnosis not present

## 2024-01-08 DIAGNOSIS — J302 Other seasonal allergic rhinitis: Secondary | ICD-10-CM | POA: Diagnosis not present

## 2024-01-08 DIAGNOSIS — D696 Thrombocytopenia, unspecified: Secondary | ICD-10-CM | POA: Diagnosis not present

## 2024-01-08 DIAGNOSIS — D6869 Other thrombophilia: Secondary | ICD-10-CM | POA: Diagnosis not present

## 2024-01-08 DIAGNOSIS — Z23 Encounter for immunization: Secondary | ICD-10-CM | POA: Diagnosis not present

## 2024-01-08 DIAGNOSIS — R269 Unspecified abnormalities of gait and mobility: Secondary | ICD-10-CM | POA: Diagnosis not present

## 2024-01-08 DIAGNOSIS — E782 Mixed hyperlipidemia: Secondary | ICD-10-CM | POA: Diagnosis not present

## 2024-01-18 NOTE — Telephone Encounter (Signed)
 NPSG HTA shara: [id number] (exp. 10/308/25 to 04/03/24)

## 2024-02-02 DIAGNOSIS — I10 Essential (primary) hypertension: Secondary | ICD-10-CM | POA: Diagnosis not present

## 2024-02-02 DIAGNOSIS — G459 Transient cerebral ischemic attack, unspecified: Secondary | ICD-10-CM | POA: Diagnosis not present

## 2024-02-04 ENCOUNTER — Emergency Department (HOSPITAL_COMMUNITY)

## 2024-02-04 ENCOUNTER — Other Ambulatory Visit: Payer: Self-pay

## 2024-02-04 ENCOUNTER — Emergency Department (HOSPITAL_COMMUNITY)
Admission: EM | Admit: 2024-02-04 | Discharge: 2024-02-04 | Disposition: A | Discharge status: Home / Self Care | Attending: Emergency Medicine | Admitting: Emergency Medicine

## 2024-02-04 DIAGNOSIS — Z8673 Personal history of transient ischemic attack (TIA), and cerebral infarction without residual deficits: Secondary | ICD-10-CM | POA: Diagnosis not present

## 2024-02-04 DIAGNOSIS — I4891 Unspecified atrial fibrillation: Secondary | ICD-10-CM | POA: Insufficient documentation

## 2024-02-04 DIAGNOSIS — I700 Atherosclerosis of aorta: Secondary | ICD-10-CM | POA: Diagnosis not present

## 2024-02-04 DIAGNOSIS — Z8679 Personal history of other diseases of the circulatory system: Secondary | ICD-10-CM | POA: Diagnosis not present

## 2024-02-04 DIAGNOSIS — Z4789 Encounter for other orthopedic aftercare: Secondary | ICD-10-CM | POA: Diagnosis not present

## 2024-02-04 DIAGNOSIS — I69398 Other sequelae of cerebral infarction: Secondary | ICD-10-CM | POA: Insufficient documentation

## 2024-02-04 DIAGNOSIS — T68XXXA Hypothermia, initial encounter: Secondary | ICD-10-CM | POA: Diagnosis not present

## 2024-02-04 DIAGNOSIS — I672 Cerebral atherosclerosis: Secondary | ICD-10-CM | POA: Diagnosis not present

## 2024-02-04 DIAGNOSIS — I69351 Hemiplegia and hemiparesis following cerebral infarction affecting right dominant side: Secondary | ICD-10-CM | POA: Insufficient documentation

## 2024-02-04 DIAGNOSIS — R29818 Other symptoms and signs involving the nervous system: Secondary | ICD-10-CM | POA: Diagnosis not present

## 2024-02-04 DIAGNOSIS — E782 Mixed hyperlipidemia: Secondary | ICD-10-CM | POA: Diagnosis not present

## 2024-02-04 DIAGNOSIS — R1110 Vomiting, unspecified: Secondary | ICD-10-CM | POA: Diagnosis present

## 2024-02-04 DIAGNOSIS — R110 Nausea: Secondary | ICD-10-CM | POA: Diagnosis not present

## 2024-02-04 DIAGNOSIS — X31XXXA Exposure to excessive natural cold, initial encounter: Secondary | ICD-10-CM | POA: Diagnosis not present

## 2024-02-04 DIAGNOSIS — M1990 Unspecified osteoarthritis, unspecified site: Secondary | ICD-10-CM | POA: Diagnosis not present

## 2024-02-04 DIAGNOSIS — R0989 Other specified symptoms and signs involving the circulatory and respiratory systems: Secondary | ICD-10-CM | POA: Diagnosis not present

## 2024-02-04 DIAGNOSIS — R42 Dizziness and giddiness: Secondary | ICD-10-CM | POA: Diagnosis not present

## 2024-02-04 DIAGNOSIS — I6509 Occlusion and stenosis of unspecified vertebral artery: Secondary | ICD-10-CM | POA: Diagnosis not present

## 2024-02-04 DIAGNOSIS — Z79899 Other long term (current) drug therapy: Secondary | ICD-10-CM | POA: Insufficient documentation

## 2024-02-04 DIAGNOSIS — Z7901 Long term (current) use of anticoagulants: Secondary | ICD-10-CM | POA: Diagnosis not present

## 2024-02-04 DIAGNOSIS — N400 Enlarged prostate without lower urinary tract symptoms: Secondary | ICD-10-CM | POA: Diagnosis not present

## 2024-02-04 DIAGNOSIS — I6523 Occlusion and stenosis of bilateral carotid arteries: Secondary | ICD-10-CM | POA: Diagnosis not present

## 2024-02-04 DIAGNOSIS — I6782 Cerebral ischemia: Secondary | ICD-10-CM | POA: Diagnosis not present

## 2024-02-04 DIAGNOSIS — I639 Cerebral infarction, unspecified: Secondary | ICD-10-CM | POA: Diagnosis not present

## 2024-02-04 DIAGNOSIS — G459 Transient cerebral ischemic attack, unspecified: Secondary | ICD-10-CM | POA: Diagnosis not present

## 2024-02-04 LAB — URINALYSIS, ROUTINE W REFLEX MICROSCOPIC
Bilirubin Urine: NEGATIVE
Glucose, UA: NEGATIVE mg/dL
Ketones, ur: NEGATIVE mg/dL
Leukocytes,Ua: NEGATIVE
Nitrite: NEGATIVE
Protein, ur: NEGATIVE mg/dL
Specific Gravity, Urine: 1.01 (ref 1.005–1.030)
pH: 7 (ref 5.0–8.0)

## 2024-02-04 LAB — DIFFERENTIAL
Abs Immature Granulocytes: 0.03 K/uL (ref 0.00–0.07)
Basophils Absolute: 0 K/uL (ref 0.0–0.1)
Basophils Relative: 1 %
Eosinophils Absolute: 0.6 K/uL — ABNORMAL HIGH (ref 0.0–0.5)
Eosinophils Relative: 7 %
Immature Granulocytes: 0 %
Lymphocytes Relative: 43 %
Lymphs Abs: 3.7 K/uL (ref 0.7–4.0)
Monocytes Absolute: 0.5 K/uL (ref 0.1–1.0)
Monocytes Relative: 6 %
Neutro Abs: 3.7 K/uL (ref 1.7–7.7)
Neutrophils Relative %: 43 %

## 2024-02-04 LAB — I-STAT CHEM 8, ED
BUN: 25 mg/dL — ABNORMAL HIGH (ref 8–23)
Calcium, Ion: 1.07 mmol/L — ABNORMAL LOW (ref 1.15–1.40)
Chloride: 103 mmol/L (ref 98–111)
Creatinine, Ser: 0.9 mg/dL (ref 0.61–1.24)
Glucose, Bld: 121 mg/dL — ABNORMAL HIGH (ref 70–99)
HCT: 41 % (ref 39.0–52.0)
Hemoglobin: 13.9 g/dL (ref 13.0–17.0)
Potassium: 4.2 mmol/L (ref 3.5–5.1)
Sodium: 139 mmol/L (ref 135–145)
TCO2: 25 mmol/L (ref 22–32)

## 2024-02-04 LAB — CBC
HCT: 39.9 % (ref 39.0–52.0)
Hemoglobin: 12.6 g/dL — ABNORMAL LOW (ref 13.0–17.0)
MCH: 27.6 pg (ref 26.0–34.0)
MCHC: 31.6 g/dL (ref 30.0–36.0)
MCV: 87.3 fL (ref 80.0–100.0)
Platelets: 112 K/uL — ABNORMAL LOW (ref 150–400)
RBC: 4.57 MIL/uL (ref 4.22–5.81)
RDW: 14.5 % (ref 11.5–15.5)
WBC: 8.6 K/uL (ref 4.0–10.5)
nRBC: 0 % (ref 0.0–0.2)

## 2024-02-04 LAB — URINALYSIS, MICROSCOPIC (REFLEX)

## 2024-02-04 LAB — COMPREHENSIVE METABOLIC PANEL WITH GFR
ALT: 15 U/L (ref 0–44)
AST: 32 U/L (ref 15–41)
Albumin: 3.2 g/dL — ABNORMAL LOW (ref 3.5–5.0)
Alkaline Phosphatase: 47 U/L (ref 38–126)
Anion gap: 11 (ref 5–15)
BUN: 19 mg/dL (ref 8–23)
CO2: 20 mmol/L — ABNORMAL LOW (ref 22–32)
Calcium: 8.1 mg/dL — ABNORMAL LOW (ref 8.9–10.3)
Chloride: 103 mmol/L (ref 98–111)
Creatinine, Ser: 0.8 mg/dL (ref 0.61–1.24)
GFR, Estimated: >60 mL/min (ref >60)
Glucose, Bld: 123 mg/dL — ABNORMAL HIGH (ref 70–99)
Potassium: 4.1 mmol/L (ref 3.5–5.1)
Sodium: 134 mmol/L — ABNORMAL LOW (ref 135–145)
Total Bilirubin: 1 mg/dL (ref 0.0–1.2)
Total Protein: 5.9 g/dL — ABNORMAL LOW (ref 6.5–8.1)

## 2024-02-04 LAB — LIPID PANEL
Cholesterol: 116 mg/dL (ref 0–200)
HDL: 42 mg/dL (ref >40)
LDL Cholesterol: 68 mg/dL (ref 0–99)
Total CHOL/HDL Ratio: 2.8 ratio
Triglycerides: 29 mg/dL (ref <150)
VLDL: 6 mg/dL (ref 0–40)

## 2024-02-04 LAB — PROTIME-INR
INR: 1.2 (ref 0.8–1.2)
Prothrombin Time: 15.3 s — ABNORMAL HIGH (ref 11.4–15.2)

## 2024-02-04 LAB — T4, FREE: Free T4: 1.05 ng/dL (ref 0.61–1.12)

## 2024-02-04 LAB — ETHANOL: Alcohol, Ethyl (B): <15 mg/dL (ref <15)

## 2024-02-04 LAB — APTT: aPTT: 33 s (ref 24–36)

## 2024-02-04 LAB — TSH: TSH: 1.081 u[IU]/mL (ref 0.350–4.500)

## 2024-02-04 LAB — CBG MONITORING, ED: Glucose-Capillary: 122 mg/dL — ABNORMAL HIGH (ref 70–99)

## 2024-02-04 MED ORDER — SODIUM CHLORIDE 0.9% FLUSH
3.0000 mL | Freq: Once | INTRAVENOUS | Status: AC
  Administered 2024-02-04: 3 mL via INTRAVENOUS

## 2024-02-04 MED ORDER — APIXABAN 5 MG PO TABS
5.0000 mg | ORAL_TABLET | Freq: Two times a day (BID) | ORAL | Status: DC
  Administered 2024-02-04: 5 mg via ORAL
  Filled 2024-02-04: qty 1

## 2024-02-04 MED ORDER — ATORVASTATIN CALCIUM 40 MG PO TABS
40.0000 mg | ORAL_TABLET | Freq: Every day | ORAL | Status: DC
  Administered 2024-02-04: 40 mg via ORAL
  Filled 2024-02-04: qty 1

## 2024-02-04 MED ORDER — IOHEXOL 350 MG/ML SOLN
75.0000 mL | Freq: Once | INTRAVENOUS | Status: AC | PRN
  Administered 2024-02-04: 75 mL via INTRAVENOUS

## 2024-02-04 NOTE — ED Triage Notes (Signed)
 Pt. BIB [facility] from home with c/o stroke-like symptoms that started around 1500 today; the pt. Noticed some L side weakness, dizziness, and some nausea. The pt. Has a Hx of TIA's and states that this felt similar to those. The pt. Denies any SOB, Chest pain, and vomtting. The pt. Has a 18G in his L wrist.

## 2024-02-04 NOTE — ED Notes (Signed)
 Rn called to assist looking for glasses. RN searched bed and room unable to find glasses. RN walked to MRI and asked them if they had seen his glasses MRI reports that the pt did not have glasses when he was in MRI.

## 2024-02-04 NOTE — ED Notes (Signed)
Neuro PA at [facility]

## 2024-02-04 NOTE — ED Provider Notes (Signed)
  EMERGENCY DEPARTMENT AT [facility] Provider Note   CSN: [id number] Arrival date & time: 02/04/24  1530     Patient presents with: Code Stroke   Terry Gray is a 89 y.o. male w/ hx of a fib on eliquis , CVA, presenting from home with acute onset of dizziness that began about an hour prior to arrival.  Patient arrives as a code stroke.  The patient reports essentially bedbound due to weakness in his lower extremities.  He is here with his family members, including his 2 children and his grandson at the bedside.  The patient says he has chronic right sided weakness from an old stroke.  He noticed more so left-sided weakness, dizziness and some nausea earlier today.  He is feeling much better.  He said his symptoms only lasted about 15 minutes.  He denies any recent fevers, shaking chills, coughing, shortness of breath, flulike symptoms or sick contacts in the house  EMS reported initial blood pressure quite high over 200 but subsequently repeat pressure improved after dose of Zofran  and route to the hospital, 130 systolic.   HPI     Prior to Admission medications   Medication Sig Start Date End Date Taking? Authorizing Provider  apixaban  (ELIQUIS ) 5 MG TABS tablet Take 1 tablet (5 mg total) by mouth 2 (two) times daily. 11/14/23   Sigdel, Santosh, MD  atorvastatin  (LIPITOR) 40 MG tablet Take 1 tablet (40 mg total) by mouth every evening. 11/14/23   Sigdel, Derryl, MD  docusate sodium  (COLACE) 100 MG capsule Take 100 mg by mouth daily. Patient not taking: Reported on 01/01/2024 04/19/21   [provider]  finasteride  (PROSCAR ) 5 MG tablet Take 5 mg by mouth daily.    [provider]  ramipril  (ALTACE ) 10 MG capsule Take 10 mg by mouth every morning.  05/23/13   [provider]  Vitamin D , Ergocalciferol , (DRISDOL) 1.25 MG (50000 UNIT) CAPS capsule Take 50,000 Units by mouth 2 (two) times a week.    [provider]    Allergies:  Patient has no known allergies.    Review of Systems  Updated Vital Signs BP 129/63   Pulse 66   Temp 97.8 F (36.6 C) (Oral)   Resp 19   Wt 64.6 kg   SpO2 96%   BMI 26.05 kg/m   Physical Exam Constitutional:      General: He is not in acute distress. HENT:     Head: Normocephalic and atraumatic.  Eyes:     Conjunctiva/sclera: Conjunctivae normal.     Pupils: Pupils are equal, round, and reactive to light.  Cardiovascular:     Rate and Rhythm: Normal rate and regular rhythm.  Pulmonary:     Effort: Pulmonary effort is normal. No respiratory distress.  Abdominal:     General: There is no distension.     Tenderness: There is no abdominal tenderness.  Skin:    General: Skin is warm and dry.  Neurological:     Mental Status: He is alert.     Comments: Generalized weak strength of the lower extremities, right greater than left, no sensory deficits, speech is clear, no cranial nerve deficits on exam  Psychiatric:        Mood and Affect: Mood normal.        Behavior: Behavior normal.     (all labs ordered are listed, but only abnormal results are displayed) Labs Reviewed  PROTIME-INR - Abnormal; Notable for the following components:  Result Value   Prothrombin Time 15.3 (*)    All other components within normal limits  CBC - Abnormal; Notable for the following components:   Hemoglobin 12.6 (*)    Platelets 112 (*)    All other components within normal limits  DIFFERENTIAL - Abnormal; Notable for the following components:   Eosinophils Absolute 0.6 (*)    All other components within normal limits  COMPREHENSIVE METABOLIC PANEL WITH GFR - Abnormal; Notable for the following components:   Sodium 134 (*)    CO2 20 (*)    Glucose, Bld 123 (*)    Calcium  8.1 (*)    Total Protein 5.9 (*)    Albumin 3.2 (*)    All other components within normal limits  URINALYSIS, ROUTINE W REFLEX MICROSCOPIC - Abnormal; Notable for the following components:   Hgb urine dipstick  TRACE (*)    All other components within normal limits  URINALYSIS, MICROSCOPIC (REFLEX) - Abnormal; Notable for the following components:   Bacteria, UA RARE (*)    All other components within normal limits  I-STAT CHEM 8, ED - Abnormal; Notable for the following components:   BUN 25 (*)    Glucose, Bld 121 (*)    Calcium , Ion 1.07 (*)    All other components within normal limits  CBG MONITORING, ED - Abnormal; Notable for the following components:   Glucose-Capillary 122 (*)    All other components within normal limits  APTT  ETHANOL  LIPID PANEL  TSH  T4, FREE  HEMOGLOBIN A1C    EKG: EKG Interpretation Date/Time:  Sunday February 04 2024 17:57:12 EST Ventricular Rate:  58 PR Interval:  223 QRS Duration:  90 QT Interval:  412 QTC Calculation: 405 R Axis:   43  Text Interpretation: Sinus rhythm Prolonged PR interval Minimal ST elevation, inferior leads Confirmed by Cottie Cough [phone]) on 02/04/2024 6:02:45 PM  Radiology: DG Chest Portable 1 View Result Date: 02/04/2024 CLINICAL DATA:  Infection EXAM: PORTABLE CHEST 1 VIEW COMPARISON:  Chest radiograph dated 11/12/2023 FINDINGS: Low lung volumes with bronchovascular crowding. No focal consolidations. No pleural effusion or pneumothorax. The heart size and mediastinal contours are within normal limits. Cervical spinal fixation hardware appears intact. Circular metallic radiodensity projects over the upper mediastinum. IMPRESSION: 1. Low lung volumes with bronchovascular crowding. No focal consolidations. 2. Circular metallic radiodensity projects over the upper mediastinum, likely external to the patient. Electronically Signed   By: Limin  Xu M.D.   On: 02/04/2024 19:34   MR BRAIN WO CONTRAST Result Date: 02/04/2024 EXAM: MRI BRAIN WITHOUT CONTRAST 02/04/2024 05:43:41 PM TECHNIQUE: Multiplanar multisequence MRI of the head/brain was performed without the administration of intravenous contrast. COMPARISON: Comparison of CT  head and CT angio head and neck 02/04/2024 and MR head without contrast 11/12/2023. CLINICAL HISTORY: Neuro deficit, acute, stroke suspected; stroke. FINDINGS: BRAIN AND VENTRICLES: No acute infarct. No intracranial hemorrhage. No mass. No midline shift. No hydrocephalus. A remote lacunar infarct is present in the right PICA territory. Periventricular and scattered subcortical T2 hyperintensities are mildly advanced for age. A remote lacunar infarct is again noted within the left thalamus. The sella is unremarkable. Normal flow voids. ORBITS: Bilateral lens replacements are noted. The globes and orbits are otherwise within normal limits. SINUSES AND MASTOIDS: No acute abnormality. BONES AND SOFT TISSUES: Normal marrow signal. No acute soft tissue abnormality. IMPRESSION: 1. No acute intracranial abnormality. 2. Remote lacunar infarcts in the right PICA territory and left thalamus. 3. Mildly advanced periventricular and  scattered subcortical T2 hyperintensities for age. Electronically signed by: Lonni Necessary MD 02/04/2024 06:14 PM EST RP Workstation: [id number]   CT ANGIO HEAD NECK W WO CM (CODE STROKE) Result Date: 02/04/2024 CLINICAL DATA:  Neuro deficit, acute, stroke suspected. EXAM: CT ANGIOGRAPHY HEAD AND NECK WITH AND WITHOUT CONTRAST TECHNIQUE: Multidetector CT imaging of the head and neck was performed using the standard protocol during bolus administration of intravenous contrast. Multiplanar CT image reconstructions and MIPs were obtained to evaluate the vascular anatomy. Carotid stenosis measurements (when applicable) are obtained utilizing NASCET criteria, using the distal internal carotid diameter as the denominator. RADIATION DOSE REDUCTION: This exam was performed according to the departmental dose-optimization program which includes automated exposure control, adjustment of the mA and/or kV according to patient size and/or use of iterative reconstruction technique. CONTRAST:  75mL  OMNIPAQUE  IOHEXOL  350 MG/ML SOLN COMPARISON:  Head CT earlier same day.  CT angiography 11/12/2023. FINDINGS: CTA NECK FINDINGS Aortic arch: Aortic atherosclerosis. Branching pattern is normal without flow limiting origin stenosis. Right carotid system: Common carotid artery widely patent to the bifurcation. Calcified plaque at the carotid bifurcation and ICA bulb but no stenosis. Cervical ICA widely patent to the skull base. Left carotid system: Common carotid artery widely patent to the bifurcation. Soft and calcified plaque at the carotid bifurcation and ICA bulb. Minimal diameter of the proximal ICA measures 2.8 mm. Compared to a more distal cervical ICA diameter of 4 mm, this indicates a 30% stenosis. ICA widely patent beyond that to the skull base. Vertebral arteries: No flow limiting proximal subclavian stenosis. Calcified plaque at both vertebral artery origins. Difficult to precisely measures stenosis of the vertebral artery origins because of chest density and motion degradation. Estimated 70% stenosis on the left and 50% stenosis on the right. Beyond that, the vertebral arteries are patent through the cervical region to the foramen magnum. Skeleton: Chronic cervical degenerative changes including advanced arthropathy at the C1-2 articulation. Distant fusion C4 through C7. Other neck: No mass or lymphadenopathy. Upper chest: Lung apices are clear. Review of the MIP images confirms the above findings CTA HEAD FINDINGS Anterior circulation: Both internal carotid arteries are patent through the skull base and siphon regions. Siphon atherosclerotic calcification but no stenosis greater than 50% suspected. The anterior and middle cerebral vessels are patent. No large vessel occlusion or proximal flow limiting stenosis. No aneurysm or vascular malformation. Posterior circulation: Both vertebral arteries patent through the foramen magnum to the basilar artery. There is atherosclerotic disease of the V4 segments  and the basilar artery but no stenosis greater than 30%. SCA and posterior cerebral arteries show flow. Venous sinuses: Patent and normal. Anatomic variants: None significant. Review of the MIP images confirms the above findings IMPRESSION: 1. No intracranial large vessel occlusion or proximal flow limiting stenosis. 2. Aortic atherosclerosis. 3. Atherosclerotic disease at both carotid bifurcations. No stenosis on the right. 30% stenosis of the proximal ICA on the left. 4. Atherosclerotic disease at both vertebral artery origins. Difficult to precisely measure stenosis because of chest density and motion degradation. Estimated 70% stenosis on the left and 50% stenosis on the right. 5. Atherosclerotic disease in the carotid siphon regions but without stenosis greater than 50%. 6. Atherosclerotic disease in the V4 segments and basilar artery but without stenosis greater than 30%. 7. These results were communicated to Dr. Nichola At 3:54 pm on 02/04/2024 by text page via the [facility] messaging system. Aortic Atherosclerosis (ICD10-I70.0). Electronically Signed   By: Oneil Cristal HERO.D.  On: 02/04/2024 15:54   CT HEAD CODE STROKE WO CONTRAST Result Date: 02/04/2024 CLINICAL DATA:  Code stroke. Neuro deficit, acute, stroke suspected. EXAM: CT HEAD WITHOUT CONTRAST TECHNIQUE: Contiguous axial images were obtained from the base of the skull through the vertex without intravenous contrast. RADIATION DOSE REDUCTION: This exam was performed according to the departmental dose-optimization program which includes automated exposure control, adjustment of the mA and/or kV according to patient size and/or use of iterative reconstruction technique. COMPARISON:  11/12/2023 FINDINGS: Brain: Age related volume loss. Chronic small-vessel ischemic changes of the white matter. No sign of acute infarction, mass lesion, hemorrhage, hydrocephalus or extra-axial collection. Vascular: There is atherosclerotic calcification of the major vessels  at the base of the brain. Skull: Negative Sinuses/Orbits: Clear/normal Other: None ASPECTS (Alberta Stroke Program Early CT Score) - Ganglionic level infarction (caudate, lentiform nuclei, internal capsule, insula, M1-M3 cortex): 7 - Supraganglionic infarction (M4-M6 cortex): 3 Total score (0-10 with 10 being normal): 10 IMPRESSION: 1. No acute CT finding. Age related volume loss and chronic small-vessel ischemic changes of the white matter. 2. Aspects is 10. 3. These results were communicated to Dr. Nichola At 3:42 pm on 02/04/2024 by text page via the [facility] messaging system. Electronically Signed   By: Oneil Officer M.D.   On: 02/04/2024 15:43     Procedures   Medications Ordered in the ED  apixaban  (ELIQUIS ) tablet 5 mg (5 mg Oral Given 02/04/24 1853)  atorvastatin  (LIPITOR) tablet 40 mg (40 mg Oral Given 02/04/24 1853)  sodium chloride  flush (NS) 0.9 % injection 3 mL (3 mLs Intravenous Given 02/04/24 1613)  iohexol  (OMNIPAQUE ) 350 MG/ML injection 75 mL (75 mLs Intravenous Contrast Given 02/04/24 1546)    Clinical Course as of 02/04/24 1938  Sun Feb 04, 2024  1714 Suspect hypothermia was likely environmental... patient reports he was brought in by EMS without any blankets... it is 16F outside.  Less likely infectious. He has no infection symmptoms per history otherwise.  We'll check UA and dg chest to be thorough, but I doubt sepsis.  WBC is normal here. [MT]  1937 Temp normalized, patient ate food, feels good, ready to go home with family. [MT]    Clinical Course User Index [MT] Ashby Moskal, Donnice PARAS, MD                                 Medical Decision Making Amount and/or Complexity of Data Reviewed Labs: ordered. Radiology: ordered. ECG/medicine tests: ordered.  Risk Prescription drug management.   This patient presents to the ED with concern for generalized dizziness, weakness. This involves an extensive number of treatment options, and is a complaint that carries with it a high  risk of complications and morbidity.  The differential diagnosis includes infection vs metabolic derangement versus CVA or TIA versus arrhythmia versus other  Co-morbidities that complicate the patient evaluation: History of prior stroke, cardiovascular risk factors and stroke risk factors include high cholesterol, high blood pressure  Additional history obtained from EMS  I ordered and personally interpreted labs.  The pertinent results include:  No emergent findings  I ordered imaging studies including CT head, CT angio head and neck, MRI brain I independently visualized and interpreted imaging which showed no emergent findings, chronic stroke noted I agree with the radiologist interpretation  The patient was maintained on a cardiac monitor.  I personally viewed and interpreted the cardiac monitored which showed an underlying rhythm of:  NSR  Per my interpretation the patient's ECG shows Normal sinus rhythm no acute ischemic findings    I have reviewed the patients home medicines and have made adjustments as needed  Test Considered: doubt acute PE; pt compliant on eliquis ; doubt meningitis; doubt sepsis  I requested consultation with the neurology,  and discussed lab and imaging findings as well as pertinent plan - they recommend: see consult note  After the interventions noted above, I reevaluated the patient and found that they have: improved   Disposition:  After consideration of the diagnostic results and the patient's response to treatment, I feel that the patent would benefit from close outpatient follow up.      Final diagnoses:  Dizziness  Hypothermia, initial encounter    ED Discharge Orders     None          Kelson Queenan, Donnice PARAS, MD 02/04/24 [phone]

## 2024-02-04 NOTE — ED Notes (Signed)
 Rectal Temp: 93.5; EDP made aware; Bair Hugger applied

## 2024-02-04 NOTE — ED Notes (Signed)
 EDP at [facility]

## 2024-02-04 NOTE — ED Notes (Signed)
 Patient transported to MRI

## 2024-02-04 NOTE — Code Documentation (Signed)
 Stroke Response Nurse Documentation Code Documentation  Terry Gray is a 89 y.o. male arriving to [facility] as Code Stroke activation via [facility]. LKW 1500. Now complaining of dizziness; SBP 200 with EMS.   Stroke team met patient at bridge, labs drawn and taken to CT. NIH 4, see flowsheet for details. CT/CTA completed. No TNK, pt on Eliquis . No LVO.  Care Plan: q2h NIH and VS. NPO until swallow screen. Permissive HTN. Bedside handoff with ED RN Damien.    Tonna Lacks K  Rapid Response RN

## 2024-02-04 NOTE — ED Notes (Signed)
 CCMD called.

## 2024-02-04 NOTE — Consult Note (Addendum)
 NEUROLOGY CONSULT NOTE   Date of service: February 04, 2024 Patient Name: Terry Gray MRN:  [id number] DOB:  06/03/1933 Chief Complaint: dizziness, nausea, vomiting  Requesting Provider: Cottie Donnice PARAS, MD  History of Present Illness  TYGE SOMERS is a 89 y.o. male with hx of bilateral foot drop s/p ACDF in 2023, Arthritis, Chronic lower back pain, Complication of anesthesia, DOE (dyspnea on exertion), Dysrhythmia, GERD (gastroesophageal reflux disease), Hepatitis B (~ 2013), High cholesterol, Hypertension, atrial fibrillation (HCC) (07/05/2013), Shortness of breath, Stroke (HCC) (~ 2012), TIA (transient ischemic attack) (03/07/2010), and Vertigo (07/05/2013).  He presents today for acute onset nausea, heaving without vomiting, dizziness (described as lightheadedness, no room spinning or motion sensations). It occurred when he was sitting on the toilet, but he denies straining at time of onset or prior. It remains constant since then. Nausea improved s/p zofran . Initial BP >248mm Hg systolic per EMS, but improved to 130s after zofran .   He was seen most recently in 11/2023, when he presented with a similar, but not identical presentation. At that time he also had a headache. He was found to have posterior circulation athero and an acute infarct in periventricular left posterior temporal lobe. LDL 83. Atorvastatin  increased to 40mg . He was started on asa and Plavix  but switched to Eliquis  at discharge due to history of afib. However, no atrial fibrillation captured on tele or loop monitor.   He had been undergoing HH therapies and using walker to ambulate. At time of clinic visit, he also complained of slight am headaches and was referred for sleep study; son has hx OSA.    LKW: 1500 02/04/24 Modified rankin score: 3-Moderate disability-requires help but walks WITHOUT assistance, uses walker  IV Thrombolysis:  No (reason). Takes apixaban , last dose this am.   CT head without  bleed  NIHSS components Score: Comment  1a Level of Conscious 0[x]  1[]  2[]  3[]      1b LOC Questions 0[x]  1[]  2[]       1c LOC Commands 0[x]  1[]  2[]       2 Best Gaze 0[x]  1[]  2[]       3 Visual 0[x]  1[]  2[]  3[]      4 Facial Palsy 0[x]  1[]  2[]  3[]      5a Motor Arm - left 0[x]  1[]  2[]  3[]  4[]  UN[]    5b Motor Arm - Right 0[x]  1[]  2[]  3[]  4[]  UN[]    6a Motor Leg - Left 0[x]  1[]  2[]  3[]  4[]  UN[]    6b Motor Leg - Right 0[]  1[x]  2[]  3[]  4[]  UN[]    7 Limb Ataxia 0[x]  1[]  2[]  UN[]      8 Sensory 0[x]  1[]  2[]  UN[]      9 Best Language 0[x]  1[]  2[]  3[]      10 Dysarthria 0[x]  1[]  2[]  UN[]      11 Extinct. and Inattention 0[x]  1[]  2[]       TOTAL: 1 Baseline      ROS  Comprehensive ROS performed and pertinent positives documented in HPI   Past History   Past Medical History:  Diagnosis Date   Arthritis    Chronic lower back pain    Complication of anesthesia    blood pressure dropped low in dentist office during dental work- one 1 time   DOE (dyspnea on exertion)    Dysrhythmia    Hx. intermittent Atrial Fibrilllation   GERD (gastroesophageal reflux disease)    04/30/21- not current   Hepatitis B ~ 2013   suspected; [facility]   High  cholesterol    Hypertension    New onset atrial fibrillation (HCC) 07/05/2013   Shortness of breath    Stroke (HCC) ~ 2012   residual right side decreased sensation   TIA (transient ischemic attack) 03/07/2010   of unknown origin/notes 07/05/2013; I've had 3 or 4; call it stroke sometimes; TIA sometimes   Vertigo 07/05/2013    Past Surgical History:  Procedure Laterality Date   ANTERIOR CERVICAL DECOMP/DISCECTOMY FUSION N/A 05/03/2021   Procedure: ANTERIOR CERVICAL DECOMPRESSION /DISCECTOMY CORTEZ MESSICK PROSTHESIS ,PLATE/SCREWS CERVICAL FOUR- FIVE, CERVICAL FIVE- SIX, CERVICAL SIX- SEVEN;  Surgeon: Mavis Purchase, MD;  Location: [facility] OR;  Service: Neurosurgery;  Laterality: N/A;   CAPSULOTOMY  04/03/2012   Procedure: MINOR  CAPSULOTOMY;  Surgeon: Debby FORBES Sharalyn Mickey., MD;  Location: [facility] OR;  Service: Ophthalmology;  Laterality: Right;   CATARACT EXTRACTION W/PHACO  02/01/2012   Procedure: CATARACT EXTRACTION PHACO AND INTRAOCULAR LENS PLACEMENT (IOC);  Surgeon: Jestine Bunnell, MD;  Location: [facility] OR;  Service: Ophthalmology;  Laterality: Right;   COLONOSCOPY WITH PROPOFOL  N/A 11/28/2013   Procedure: COLONOSCOPY WITH PROPOFOL ;  Surgeon: Gladis MARLA Louder, MD;  Location: [facility];  Service: Endoscopy;  Laterality: N/A;   ESOPHAGOGASTRODUODENOSCOPY (EGD) WITH PROPOFOL  N/A 11/28/2013   Procedure: ESOPHAGOGASTRODUODENOSCOPY (EGD) WITH PROPOFOL ;  Surgeon: Gladis MARLA Louder, MD;  Location: [facility];  Service: Endoscopy;  Laterality: N/A;   EYE SURGERY     YAG LASER APPLICATION  04/03/2012   Procedure: YAG LASER APPLICATION;  Surgeon: Debby FORBES Sharalyn Mickey., MD;  Location: [facility] OR;  Service: Ophthalmology;  Laterality: N/A;    Family History: No family history on file.  Social History  reports that he quit smoking about 57 years ago. His smoking use included cigarettes. He has never used smokeless tobacco. He reports current alcohol use. He reports that he does not use drugs.  No Known Allergies  Medications   Current Facility-Administered Medications:    sodium chloride  flush (NS) 0.9 % injection 3 mL, 3 mL, Intravenous, Once, Trifan, Donnice PARAS, MD  Current Outpatient Medications:    apixaban  (ELIQUIS ) 5 MG TABS tablet, Take 1 tablet (5 mg total) by mouth 2 (two) times daily., Disp: 60 tablet, Rfl: 2   atorvastatin  (LIPITOR) 40 MG tablet, Take 1 tablet (40 mg total) by mouth every evening., Disp: 30 tablet, Rfl: 0   docusate sodium  (COLACE) 100 MG capsule, Take 100 mg by mouth daily. (Patient not taking: Reported on 01/01/2024), Disp: , Rfl:    finasteride  (PROSCAR ) 5 MG tablet, Take 5 mg by mouth daily., Disp: , Rfl:    ramipril  (ALTACE ) 10 MG capsule, Take 10 mg by mouth every morning. , Disp: , Rfl:     Vitamin D , Ergocalciferol , (DRISDOL) 1.25 MG (50000 UNIT) CAPS capsule, Take 50,000 Units by mouth 2 (two) times a week., Disp: , Rfl:   Vitals   Vitals:   02/04/24 1500 02/04/24 1545  BP:  134/71  Weight: 64.6 kg     Body mass index is 26.05 kg/m.   Physical Exam   Constitutional: Appears well-developed and well-nourished.  Psych: Affect appropriate to situation.  Eyes: No scleral injection.  HENT: No OP obstruction.  Head: Normocephalic.  Cardiovascular: Normal rate and regular rhythm.  Respiratory: Effort normal, non-labored breathing.  GI: Soft.  No distension. There is no tenderness.  Skin: WDI.   Neurologic Examination   Mental Status: AA&Ox3 Language: speech is clear, In tact naming, repetition, fluency, and comprehension.attention span in tact.  Cranial Nerves:  II:  Right pupil mydriatic and non reactive (patient states from injury as child);  Visual fields full to confrontation.  III, IV, VI: EOM in tact. Eyelids elevate symmetrically.  V: Sensation intact V1-3 symmetrically  VII: no facial asymmetry   VIII: hearing intact to voice IX, X: Palate elevates symmetrically. Phonation is normal.  KP:dbffzumprjo shoulder shrug  XII: tongue is midline without fasciculations. Motor:  RUE:  5/5 throughout except 4+/5 grip  LUE:  5/5 throughout  RLE:  4/5 throughout except dorsiflexion 0/5 LLE:  5/5 throughout except dorsiflexion 0/5 Tone: is normal and bulk is normal DTRs: 3+ right hemibody, 2+ left  Sensation- Intact to light touch throughout Coordination: FTN intact bilaterally, no ataxia in BLE., no abnormal movements  Gait- deferred  Labs/Imaging/Neurodiagnostic studies   CBC:  Recent Labs  Lab 02/10/24 1532 10-Feb-2024 1537  WBC 8.6  --   NEUTROABS 3.7  --   HGB 12.6* 13.9  HCT 39.9 41.0  MCV 87.3  --   PLT 112*  --    Basic Metabolic Panel:  Lab Results  Component Value Date   NA 139 February 10, 2024   K 4.2 2024/02/10   CO2 20 (L) Feb 10, 2024   GLUCOSE  121 (H) 02-10-2024   BUN 25 (H) February 10, 2024   CREATININE 0.90 2024-02-10   CALCIUM  8.1 (L) 02/10/24   GFRNONAA >60 02/10/2024   GFRAA 69 (L) 07/06/2013   Lipid Panel:  Lab Results  Component Value Date   LDLCALC 83 11/13/2023   HgbA1c:  Lab Results  Component Value Date   HGBA1C 5.6 11/13/2023   Urine Drug Screen: No results found for: LABOPIA, COCAINSCRNUR, LABBENZ, AMPHETMU, THCU, LABBARB  Alcohol Level     Component Value Date/Time   [facility] <15 10-Feb-2024 1532   INR  Lab Results  Component Value Date   INR 1.2 02/10/2024   APTT  Lab Results  Component Value Date   APTT 33 2024/02/10   AED levels: No results found for: PHENYTOIN, ZONISAMIDE, LAMOTRIGINE, LEVETIRACETA  PERTINENT IMAGING/LABS from September hospitalization   CT head No acute abnormality. Small vessel disease.  CTA head & neck NO LVO. Severe left and mild right vertebral artery origin stenosis.  MRI  Small acute infarct in the periventricular left posterior temporal lobe.  Moderate chronic microvascular ischemic disease   This admission 02-10-24:  CT Head without contrast(Personally reviewed):  1. No acute CT finding. Age related volume loss and chronic small-vessel ischemic changes of the white matter. 2. Aspects is 10.  CT angio Head and Neck with contrast(Personally reviewed):   1. No intracranial large vessel occlusion or proximal flow limiting stenosis. 2. Aortic atherosclerosis. 3. Atherosclerotic disease at both carotid bifurcations. No stenosis on the right. 30% stenosis of the proximal ICA on the left. 4. Atherosclerotic disease at both vertebral artery origins. Difficult to precisely measure stenosis because of chest density and motion degradation. Estimated 70% stenosis on the left and 50% stenosis on the right. 5. Atherosclerotic disease in the carotid siphon regions but without stenosis greater than 50%. 6. Atherosclerotic disease in the V4 segments and basilar  artery but without stenosis greater than 30%.  MRI Brain(Personally reviewed): Pending   ASSESSMENT   Terry Gray is a 89 y.o. male with hx of recent 11/2023 left temporal periventricular ischemic stroke discharged on apixaban . He presents with similar presentation as he did back then, with dizziness, nausea, and bradycardia. He describes lightheadedness rather than true sense of vertigo. His head Ct is unremarkable and CTA h/n unchanged,  with stab;e athero of posterior circulation but patency basilar artery. No TNK given, as he is compliant with apixaban .   RECOMMENDATIONS  -continue apixaban , low suspicion large space occupying lesion intracranially that would pose bleed risk.  -continue 40mg  atorvastatin , unchanged, pending nursing dysphagia screen -permissive HTN to no higher than 220 mmHg systolic  -MRI brain. If interval ischemic infarct, reconsider secondary prevention choice (hx of afib not on tele anytime recently and with small vessel ischemia etiology high on differential, ? Benefit from antiplatelet instead of anticoagulation) -repeat lipid panel, A1c -consider TTE, especially if MRI with acute ischemia (did not undergo TTE at time of last stroke and would likely be beneficial for lightheadedness workup)      -continue telemetry     -will follow  ______________________________________________________________________  Signed, Rayfield Peter, PA-C [facility]  ATTENDING ATTESTATION:  History of stroke and fibrillation.  On Robaxin.  Came in as a code stroke for acute onset of dizziness possible ataxia.  Exam at the bridge was relatively benign.  CT and CTA negative for bleed or LVO.  Not a candidate for TNK due to anticoagulation.  Family arrived later updated them and they seem to think he is at his baseline.  Will get an MRI of positive for stroke complete stroke workup as above if negative no need for further workup.    Dr. Nichola evaluated pt independently,  reviewed imaging, chart, labs. Discussed and formulated plan with the Resident/APP. Changes were made to the note where appropriate. Please see APP/resident note above for details.   MDM: High. Pertinent labs, imaging results reviewed by me and considered in my decision making. Independently reviewed imaging. Medical records reviewed. Discussed the patient with another medical provider/personnel. Obtained history from someone other than the patient.    Cresencio Reesor,MD

## 2024-02-05 LAB — HEMOGLOBIN A1C
Hgb A1c MFr Bld: 5.5 % (ref 4.8–5.6)
Mean Plasma Glucose: 111 mg/dL

## 2024-02-08 NOTE — Telephone Encounter (Signed)
 Terry Gray spoke with the patient. He is not ready to schedule at this time because he just got out of the hospital.
